# Patient Record
Sex: Female | Born: 1960 | Race: White | Hispanic: No | Marital: Married | Smoking: Never smoker
Health system: Southern US, Community
[De-identification: ages and names within clinical notes are randomized; demographics above are authoritative.]

## PROBLEM LIST (undated history)

## (undated) DIAGNOSIS — E785 Hyperlipidemia, unspecified: Secondary | ICD-10-CM

## (undated) DIAGNOSIS — Z923 Personal history of irradiation: Secondary | ICD-10-CM

## (undated) DIAGNOSIS — F419 Anxiety disorder, unspecified: Secondary | ICD-10-CM

## (undated) DIAGNOSIS — Z800 Family history of malignant neoplasm of digestive organs: Secondary | ICD-10-CM

## (undated) DIAGNOSIS — F988 Other specified behavioral and emotional disorders with onset usually occurring in childhood and adolescence: Secondary | ICD-10-CM

## (undated) DIAGNOSIS — E559 Vitamin D deficiency, unspecified: Secondary | ICD-10-CM

## (undated) DIAGNOSIS — F32A Depression, unspecified: Secondary | ICD-10-CM

## (undated) DIAGNOSIS — F329 Major depressive disorder, single episode, unspecified: Secondary | ICD-10-CM

## (undated) DIAGNOSIS — Z803 Family history of malignant neoplasm of breast: Secondary | ICD-10-CM

## (undated) DIAGNOSIS — C50912 Malignant neoplasm of unspecified site of left female breast: Secondary | ICD-10-CM

## (undated) DIAGNOSIS — L409 Psoriasis, unspecified: Secondary | ICD-10-CM

## (undated) HISTORY — DX: Anxiety disorder, unspecified: F41.9

## (undated) HISTORY — DX: Vitamin D deficiency, unspecified: E55.9

## (undated) HISTORY — DX: Psoriasis, unspecified: L40.9

## (undated) HISTORY — PX: COLONOSCOPY: SHX174

## (undated) HISTORY — PX: BUNIONECTOMY: SHX129

## (undated) HISTORY — DX: Depression, unspecified: F32.A

## (undated) HISTORY — DX: Family history of malignant neoplasm of breast: Z80.3

## (undated) HISTORY — DX: Hyperlipidemia, unspecified: E78.5

## (undated) HISTORY — DX: Family history of malignant neoplasm of digestive organs: Z80.0

## (undated) HISTORY — DX: Other specified behavioral and emotional disorders with onset usually occurring in childhood and adolescence: F98.8

## (undated) HISTORY — DX: Malignant neoplasm of unspecified site of left female breast: C50.912

## (undated) HISTORY — DX: Major depressive disorder, single episode, unspecified: F32.9

---

## 2004-09-27 ENCOUNTER — Ambulatory Visit: Payer: Self-pay | Admitting: Podiatry

## 2006-12-09 ENCOUNTER — Emergency Department: Payer: Self-pay

## 2006-12-09 ENCOUNTER — Other Ambulatory Visit: Payer: Self-pay

## 2008-12-01 HISTORY — PX: BUNIONECTOMY: SHX129

## 2010-06-13 ENCOUNTER — Ambulatory Visit: Payer: Self-pay | Admitting: Family Medicine

## 2011-07-21 ENCOUNTER — Ambulatory Visit: Payer: Self-pay | Admitting: Family Medicine

## 2013-05-19 ENCOUNTER — Ambulatory Visit: Payer: Self-pay | Admitting: Family Medicine

## 2014-07-04 ENCOUNTER — Ambulatory Visit: Payer: Self-pay | Admitting: Family Medicine

## 2014-07-04 LAB — HM MAMMOGRAPHY: HM MAMMO: NEGATIVE

## 2014-11-16 ENCOUNTER — Ambulatory Visit: Payer: Self-pay | Admitting: Podiatry

## 2015-01-08 ENCOUNTER — Ambulatory Visit: Payer: Self-pay | Admitting: Family Medicine

## 2015-01-08 LAB — CBC AND DIFFERENTIAL
HCT: 39 % (ref 36–46)
HEMOGLOBIN: 13.5 g/dL (ref 12.0–16.0)
PLATELETS: 422 10*3/uL — AB (ref 150–399)

## 2015-11-08 ENCOUNTER — Encounter: Payer: Self-pay | Admitting: Physician Assistant

## 2016-01-18 ENCOUNTER — Ambulatory Visit (INDEPENDENT_AMBULATORY_CARE_PROVIDER_SITE_OTHER): Payer: BC Managed Care – PPO | Admitting: Physician Assistant

## 2016-01-18 ENCOUNTER — Telehealth: Payer: Self-pay | Admitting: *Deleted

## 2016-01-18 ENCOUNTER — Encounter: Payer: Self-pay | Admitting: Physician Assistant

## 2016-01-18 VITALS — BP 140/90 | HR 84 | Temp 98.4°F | Resp 16 | Wt 124.4 lb

## 2016-01-18 DIAGNOSIS — K625 Hemorrhage of anus and rectum: Secondary | ICD-10-CM

## 2016-01-18 DIAGNOSIS — IMO0002 Reserved for concepts with insufficient information to code with codable children: Secondary | ICD-10-CM | POA: Insufficient documentation

## 2016-01-18 DIAGNOSIS — F329 Major depressive disorder, single episode, unspecified: Secondary | ICD-10-CM | POA: Insufficient documentation

## 2016-01-18 DIAGNOSIS — E785 Hyperlipidemia, unspecified: Secondary | ICD-10-CM | POA: Insufficient documentation

## 2016-01-18 DIAGNOSIS — F419 Anxiety disorder, unspecified: Secondary | ICD-10-CM | POA: Insufficient documentation

## 2016-01-18 DIAGNOSIS — Z800 Family history of malignant neoplasm of digestive organs: Secondary | ICD-10-CM

## 2016-01-18 DIAGNOSIS — E559 Vitamin D deficiency, unspecified: Secondary | ICD-10-CM | POA: Insufficient documentation

## 2016-01-18 DIAGNOSIS — F32A Depression, unspecified: Secondary | ICD-10-CM | POA: Insufficient documentation

## 2016-01-18 DIAGNOSIS — R739 Hyperglycemia, unspecified: Secondary | ICD-10-CM | POA: Insufficient documentation

## 2016-01-18 DIAGNOSIS — E7800 Pure hypercholesterolemia, unspecified: Secondary | ICD-10-CM | POA: Insufficient documentation

## 2016-01-18 DIAGNOSIS — L409 Psoriasis, unspecified: Secondary | ICD-10-CM | POA: Insufficient documentation

## 2016-01-18 DIAGNOSIS — F988 Other specified behavioral and emotional disorders with onset usually occurring in childhood and adolescence: Secondary | ICD-10-CM | POA: Insufficient documentation

## 2016-01-18 LAB — POC HEMOCCULT BLD/STL (OFFICE/1-CARD/DIAGNOSTIC): Fecal Occult Blood, POC: POSITIVE — AB

## 2016-01-18 MED ORDER — HYDROCORTISONE ACETATE 25 MG RE SUPP: 25.0000 mg | Freq: Two times a day (BID) | RECTAL | Status: DC

## 2016-01-18 NOTE — Progress Notes (Signed)
Patient: Tricia Rivera Female    DOB: 17-Jul-1961   55 y.o.   MRN: [id number] Visit Date: 01/18/2016  Today's Provider: Mar Daring, PA-C   Chief Complaint  Patient presents with  . Rectal Bleeding   Subjective:    Rectal Bleeding  The current episode started yesterday (heavy bleeding). The onset was gradual (for a week she started noticing in the toilet paper and then she didn't see any for 4 days and out of no where yesterday she notice the blood in the toilet and frequently since then even without a BM. She has been feeling more tired since yesterday night.). The problem occurs frequently. The problem has been gradually worsening. The patient is experiencing no pain. The stool is described as soft. There was no prior successful therapy. Pertinent negatives include no anorexia, no fever, no abdominal pain, no diarrhea, no hematemesis, no hemorrhoids, no nausea, no rectal pain, no vomiting, no vaginal bleeding, no vaginal discharge, no chest pain, no headaches, no coughing, no difficulty breathing and no rash. She has been eating and drinking normally. Her past medical history does not include a recent illness.      No Known Allergies Previous Medications   CLOBETASOL OINTMENT (TEMOVATE) 0.05 %    APPLY TO PSORIASIS AREAS TWICE DAILY UNTIL CLEAR. CAN APPLY UNDER OCCLUSION WITH SARAN WRAP NIGHTLY   TRIAMCINOLONE CREAM (KENALOG) 0.1 %    APPLY TWICE A DAY TO AFFECTED AREAS    Review of Systems  Constitutional: Positive for chills and fatigue (yesterday was only able to walk a  mile and usually walks 2 miles.). Negative for fever.  Respiratory: Negative for cough.   Cardiovascular: Negative for chest pain.  Gastrointestinal: Positive for blood in stool (hasnt seen mixed in stool, just seen BRBPR in toilet), hematochezia and anal bleeding. Negative for nausea, vomiting, abdominal pain, diarrhea, constipation, rectal pain, anorexia, hematemesis and hemorrhoids.    Genitourinary: Negative for vaginal bleeding and vaginal discharge.  Skin: Negative for rash.  Neurological: Negative for headaches.    Social History  Substance Use Topics  . Smoking status: Never Smoker   . Smokeless tobacco: Not on file  . Alcohol Use: 1.8 oz/week    3 Glasses of wine per week   Objective:   BP 140/90 mmHg  Pulse 84  Temp(Src) 98.4 F (36.9 C) (Oral)  Resp 16  Wt 124 lb 6.4 oz (56.427 kg)  Physical Exam  Constitutional: She appears well-developed and well-nourished. No distress.  Cardiovascular: Normal rate, regular rhythm and normal heart sounds.  Exam reveals no gallop and no friction rub.   No murmur heard. Pulmonary/Chest: Effort normal and breath sounds normal. No respiratory distress. She has no wheezes. She has no rales.  Genitourinary: Rectal exam shows external hemorrhoid (soft, nontender, non-bleeding), internal hemorrhoid (tender and palpable internal hemorrhoid on exam) and tenderness. Rectal exam shows no fissure, no mass and anal tone normal. Guaiac positive stool.  Skin: She is not diaphoretic.  Vitals reviewed.       Assessment & Plan:     1. Rectal bleeding Positive hemoccult card today in the office and visual appearance of BRBPR. No blood thinners. No anti-inflammatories. BP and pulse are stable. Possibly from internal hemorrhoid. Will check labs as below and f/u pending lab results.  Also will refer to GI for further evaluation and treatment.  Anusol suppository given to patient for possible internal hemorrhoid treatment. Advised to also soak in warm water with epsom  salt for symptomatic relief. Advised of warning signs of becoming hemodynamically unstable and/or worsening signs such as not being able to have a BM or pass gas.  If these are to occur she is to go to the hospital. I will see her back in 2 weeks or sooner if needed for recheck.  - CBC w/Diff/Platelet - [facility] - Ambulatory referral to Gastroenterology - POC Hemoccult Bld/Stl  (1-Cd Office Dx) - hydrocortisone (ANUSOL-HC) 25 MG suppository; Place 1 suppository (25 mg total) rectally 2 (two) times daily.  Dispense: 12 suppository; Refill: 0  2. Family history of colon cancer See above medical treatment plan. - [facility] - Ambulatory referral to Gastroenterology       Mar Daring, PA-C  [facility]

## 2016-01-18 NOTE — Patient Instructions (Signed)

## 2016-01-18 NOTE — Telephone Encounter (Signed)
Patient called of to schedule an appointment. Patient stated that she was been seeing blood in the toilet after she uses the bathroom for the past 2 weeks. Patient stated that blood amount was a lot yesterday. Patient has symptoms of fatigue, but no other symptoms . Patient stated that she is not sure if blood is coming from her vagina or her rectum. Patient has appt scheduled for 11 am this morning.

## 2016-01-19 LAB — CBC WITH DIFFERENTIAL/PLATELET
BASOS: 1 %
Basophils Absolute: 0 10*3/uL (ref 0.0–0.2)
EOS (ABSOLUTE): 0.1 10*3/uL (ref 0.0–0.4)
EOS: 1 %
HEMATOCRIT: 42.7 % (ref 34.0–46.6)
HEMOGLOBIN: 14.4 g/dL (ref 11.1–15.9)
IMMATURE GRANS (ABS): 0 10*3/uL (ref 0.0–0.1)
IMMATURE GRANULOCYTES: 0 %
LYMPHS: 27 %
Lymphocytes Absolute: 1.3 10*3/uL (ref 0.7–3.1)
MCH: 32.9 pg (ref 26.6–33.0)
MCHC: 33.7 g/dL (ref 31.5–35.7)
MCV: 98 fL — AB (ref 79–97)
MONOCYTES: 10 %
Monocytes Absolute: 0.5 10*3/uL (ref 0.1–0.9)
NEUTROS ABS: 3.1 10*3/uL (ref 1.4–7.0)
NEUTROS PCT: 61 %
PLATELETS: 334 10*3/uL (ref 150–379)
RBC: 4.38 x10E6/uL (ref 3.77–5.28)
RDW: 13.6 % (ref 12.3–15.4)
WBC: 5 10*3/uL (ref 3.4–10.8)

## 2016-01-19 LAB — CEA: CEA: 3 ng/mL (ref 0.0–4.7)

## 2016-01-21 ENCOUNTER — Telehealth: Payer: Self-pay

## 2016-01-21 NOTE — Telephone Encounter (Signed)
Patient advised as directed below. Per patient the bleeding has stop.  Thanks,  -Bronwen Pendergraft

## 2016-01-21 NOTE — Telephone Encounter (Signed)
Wonderful. 

## 2016-01-21 NOTE — Telephone Encounter (Signed)
LMTCB  Thanks,  -Odesser Tourangeau 

## 2016-01-21 NOTE — Telephone Encounter (Signed)
-----   Message from Mar Daring, [place] sent at 01/21/2016  8:04 AM EST ----- All labs are within normal limits and stable.  Thanks! -JB

## 2016-02-01 ENCOUNTER — Ambulatory Visit: Payer: BC Managed Care – PPO | Admitting: Physician Assistant

## 2016-02-13 ENCOUNTER — Ambulatory Visit (INDEPENDENT_AMBULATORY_CARE_PROVIDER_SITE_OTHER): Payer: BC Managed Care – PPO | Admitting: Physician Assistant

## 2016-02-13 ENCOUNTER — Encounter: Payer: Self-pay | Admitting: Physician Assistant

## 2016-02-13 ENCOUNTER — Other Ambulatory Visit: Payer: Self-pay | Admitting: Physician Assistant

## 2016-02-13 ENCOUNTER — Ambulatory Visit: Payer: BC Managed Care – PPO | Admitting: Physician Assistant

## 2016-02-13 VITALS — BP 135/80 | HR 80 | Temp 98.3°F | Resp 16 | Ht 65.0 in | Wt 125.8 lb

## 2016-02-13 DIAGNOSIS — Z124 Encounter for screening for malignant neoplasm of cervix: Secondary | ICD-10-CM | POA: Diagnosis not present

## 2016-02-13 DIAGNOSIS — E7800 Pure hypercholesterolemia, unspecified: Secondary | ICD-10-CM

## 2016-02-13 DIAGNOSIS — E559 Vitamin D deficiency, unspecified: Secondary | ICD-10-CM | POA: Diagnosis not present

## 2016-02-13 DIAGNOSIS — Z1239 Encounter for other screening for malignant neoplasm of breast: Secondary | ICD-10-CM

## 2016-02-13 DIAGNOSIS — Z1159 Encounter for screening for other viral diseases: Secondary | ICD-10-CM | POA: Diagnosis not present

## 2016-02-13 DIAGNOSIS — R7309 Other abnormal glucose: Secondary | ICD-10-CM | POA: Diagnosis not present

## 2016-02-13 DIAGNOSIS — Z1231 Encounter for screening mammogram for malignant neoplasm of breast: Secondary | ICD-10-CM

## 2016-02-13 DIAGNOSIS — Z0000 Encounter for general adult medical examination without abnormal findings: Secondary | ICD-10-CM | POA: Diagnosis not present

## 2016-02-13 DIAGNOSIS — R739 Hyperglycemia, unspecified: Secondary | ICD-10-CM

## 2016-02-13 NOTE — Progress Notes (Signed)
Patient: Tricia Rivera, Female    DOB: 1961-07-22, 55 y.o.   MRN: [id number] Visit Date: 02/13/2016  Today's Provider: Mar Daring, PA-C   Chief Complaint  Patient presents with  . Annual Exam   Subjective:    Annual physical exam Tricia Rivera is a 55 y.o. female who presents today for health maintenance and complete physical. She feels well. She reports exercising-walks daily 2 miles with her dog.. She reports she is sleeping well. She had her Colonoscopy 01/31/2016 for rectal bleeding done by Dr. Rayann Heman. She had one polyp removed which was tubular adenoma and had many internal hemorrhoids.   Mammogram: 07/04/14 BI-RADS Category 1: Negative Pap: She cannot remember her last pap and we cannot find one on file.  Colonoscopy: 01/31/2016- one polyp; tubular adenoma, internal hemorrhoids. Repeat in 10 years. -----------------------------------------------------------------   Review of Systems  Constitutional: Negative.   HENT: Negative.   Eyes: Negative.   Respiratory: Negative.   Cardiovascular: Negative.   Gastrointestinal: Negative.   Endocrine: Negative.   Genitourinary: Negative.   Musculoskeletal: Negative.   Skin: Negative.        Psoriasis  Allergic/Immunologic: Negative.   Neurological: Negative.   Hematological: Negative.   Psychiatric/Behavioral: Negative.     Social History      She  reports that she has never smoked. She does not have any smokeless tobacco history on file. She reports that she drinks about 1.8 oz of alcohol per week. She reports that she does not use illicit drugs.       Social History   Social History  . Marital Status: Married    Spouse Name: N/A  . Number of Children: N/A  . Years of Education: N/A   Social History Main Topics  . Smoking status: Never Smoker   . Smokeless tobacco: None  . Alcohol Use: 1.8 oz/week    3 Glasses of wine per week  . Drug Use: No  . Sexual Activity: Not Asked   Other Topics  Concern  . None   Social History Narrative    Past Medical History  Diagnosis Date  . Anxiety   . Depression   . Hyperlipidemia   . ADD (attention deficit disorder)   . Psoriasis   . Vitamin D deficiency      Patient Active Problem List   Diagnosis Date Noted  . ASCUS favoring benign 01/18/2016  . ADD (attention deficit disorder) 01/18/2016  . Anxiety 01/18/2016  . Clinical depression 01/18/2016  . Elevated blood sugar 01/18/2016  . Hypercholesteremia 01/18/2016  . Psoriasis 01/18/2016  . Avitaminosis D 01/18/2016    Past Surgical History  Procedure Laterality Date  . Bunionectomy  2010    Family History        Family Status  Relation Status Death Age  . Maternal Grandfather Deceased     died of liver cancer.  . Mother Deceased 17-Jan-2015    died from pancreatic cancer  . Father Deceased [place]  . Sister Alive         Her family history includes Cancer in her maternal grandfather and paternal grandfather.    No Known Allergies  Previous Medications   CLOBETASOL OINTMENT (TEMOVATE) 0.05 %    Reported on 02/13/2016   HYDROCORTISONE (ANUSOL-HC) 25 MG SUPPOSITORY    Place 1 suppository (25 mg total) rectally 2 (two) times daily.   TRIAMCINOLONE CREAM (KENALOG) 0.1 %    APPLY TWICE A  DAY TO AFFECTED AREAS    Patient Care Team: Mar Daring, PA-C as PCP - General (Family Medicine)     Objective:   Vitals: BP 135/80 mmHg  Pulse 80  Temp(Src) 98.3 F (36.8 C) (Oral)  Resp 16  Ht 5\' 5"  (1.651 m)  Wt 125 lb 12.8 oz (57.063 kg)  BMI 20.93 kg/m2   Physical Exam  Constitutional: She is oriented to person, place, and time. She appears well-developed and well-nourished. No distress.  HENT:  Head: Normocephalic and atraumatic.  Right Ear: Hearing, tympanic membrane, external ear and ear canal normal.  Left Ear: Hearing, tympanic membrane, external ear and ear canal normal.  Nose: Nose normal.  Mouth/Throat: Uvula is midline, oropharynx is  clear and moist and mucous membranes are normal. No oropharyngeal exudate.  Eyes: Conjunctivae and EOM are normal. Pupils are equal, round, and reactive to light. Right eye exhibits no discharge. Left eye exhibits no discharge. No scleral icterus.  Neck: Normal range of motion. Neck supple. No JVD present. Carotid bruit is not present. No tracheal deviation present. No thyromegaly present.  Cardiovascular: Normal rate, regular rhythm, normal heart sounds and intact distal pulses.  Exam reveals no gallop and no friction rub.   No murmur heard. Pulmonary/Chest: Effort normal and breath sounds normal. No respiratory distress. She has no wheezes. She has no rales. She exhibits no tenderness. Right breast exhibits no inverted nipple, no mass, no nipple discharge, no skin change and no tenderness. Left breast exhibits no inverted nipple, no mass, no nipple discharge, no skin change and no tenderness. Breasts are symmetrical.  Abdominal: Soft. Bowel sounds are normal. She exhibits no distension and no mass. There is no tenderness. There is no rebound and no guarding. Hernia confirmed negative in the right inguinal area and confirmed negative in the left inguinal area.  Genitourinary: Rectum normal, vagina normal and uterus normal. No breast swelling, tenderness, discharge or bleeding. Pelvic exam was performed with patient supine. There is no rash, tenderness, lesion or injury on the right labia. There is no rash, tenderness, lesion or injury on the left labia. Cervix exhibits no motion tenderness, no discharge and no friability. Right adnexum displays no mass, no tenderness and no fullness. Left adnexum displays no mass, no tenderness and no fullness. No erythema, tenderness or bleeding in the vagina. No signs of injury around the vagina. No vaginal discharge found.  Musculoskeletal: Normal range of motion. She exhibits no edema or tenderness.  Lymphadenopathy:    She has no cervical adenopathy.       Right: No  inguinal adenopathy present.       Left: No inguinal adenopathy present.  Neurological: She is alert and oriented to person, place, and time. She has normal reflexes. No cranial nerve deficit. Coordination normal.  Skin: Skin is warm and dry. No rash noted. She is not diaphoretic.  Psychiatric: She has a normal mood and affect. Her behavior is normal. Judgment and thought content normal.  Vitals reviewed.    Depression Screen No flowsheet data found.    Assessment & Plan:     Routine Health Maintenance and Physical Exam  1. Annual physical exam Physical exam today was normal. I will check labs as below and follow-up with her pending these lab results. If labs are within normal limits and stable she would not need to have them rechecked for 1 year at her repeat annual physical exam. She is to call the office if she has any acute issues, questions  or concerns in the meantime. - CBC with Differential - Comprehensive metabolic panel - TSH  2. Cervical cancer screening Pap was collected today. I will follow-up with her pending these results. - Pap IG w/ reflex to HPV when ASC-U ([facility])  3. Hypercholesteremia She does have history of high cholesterol but is not currently on any cholesterol-lowering medication. I will check cluster of panel as below and follow-up pending these results. - Lipid panel  4. Elevated blood sugar She does have history of elevated blood sugar. I will check hemoglobin A1c as below and follow-up pending results. - Hemoglobin A1c  5. Avitaminosis D History of vitamin D deficiency. She is not currently on any vitamin D supplement. I will check labs as below and follow-up pending results.  6. Breast cancer screening Breast exam today was normal. She does perform self breast exams regularly. There is a family history of breast cancer in a maternal aunt. Mammogram was ordered as below. Information for [facility] breast clinic was given to patient so  that she may call to schedule her mammogram at her convenience. - Mammogram Digital screening Bilateral; Future  7. Need for hepatitis C screening test Will screen for hepatitis C and follow-up pending results. - Hepatitis C antibody screen   Exercise Activities and Dietary recommendations Goals    None      Immunization History  Administered Date(s) Administered  . Tdap 08/10/2007    Health Maintenance  Topic Date Due  . Hepatitis C Screening  September 08, 1961  . HIV Screening  02/10/1974  . PAP SMEAR  02/11/1980  . COLONOSCOPY  02/10/2009  . INFLUENZA VACCINE  07/02/2015  . MAMMOGRAM  07/04/2016  . TETANUS/TDAP  08/09/2017      Discussed health benefits of physical activity, and encouraged her to engage in regular exercise appropriate for her age and condition.    --------------------------------------------------------------------

## 2016-02-13 NOTE — Patient Instructions (Signed)
Health Maintenance, Female Adopting a healthy lifestyle and getting preventive care can go a long way to promote health and wellness. Talk with your health care provider about what schedule of regular examinations is right for you. This is a good chance for you to check in with your provider about disease prevention and staying healthy. In between checkups, there are plenty of things you can do on your own. Experts have done a lot of research about which lifestyle changes and preventive measures are most likely to keep you healthy. Ask your health care provider for more information. WEIGHT AND DIET  Eat a healthy diet  Be sure to include plenty of vegetables, fruits, low-fat dairy products, and lean protein.  Do not eat a lot of foods high in solid fats, added sugars, or salt.  Get regular exercise. This is one of the most important things you can do for your health.  Most adults should exercise for at least 150 minutes each week. The exercise should increase your heart rate and make you sweat (moderate-intensity exercise).  Most adults should also do strengthening exercises at least twice a week. This is in addition to the moderate-intensity exercise.  Maintain a healthy weight  Body mass index (BMI) is a measurement that can be used to identify possible weight problems. It estimates body fat based on height and weight. Your health care provider can help determine your BMI and help you achieve or maintain a healthy weight.  For females 28 years of age and older:   A BMI below 18.5 is considered underweight.  A BMI of 18.5 to 24.9 is normal.  A BMI of 25 to 29.9 is considered overweight.  A BMI of 30 and above is considered obese.  Watch levels of cholesterol and blood lipids  You should start having your blood tested for lipids and cholesterol at 57 years of age, then have this test every 5 years.  You may need to have your cholesterol levels checked more often if:  Your lipid  or cholesterol levels are high.  You are older than 57 years of age.  You are at high risk for heart disease.  CANCER SCREENING   Lung Cancer  Lung cancer screening is recommended for adults 75-66 years old who are at high risk for lung cancer because of a history of smoking.  A yearly low-dose CT scan of the lungs is recommended for people who:  Currently smoke.  Have quit within the past 15 years.  Have at least a 30-pack-year history of smoking. A pack year is smoking an average of one pack of cigarettes a day for 1 year.  Yearly screening should continue until it has been 15 years since you quit.  Yearly screening should stop if you develop a health problem that would prevent you from having lung cancer treatment.  Breast Cancer  Practice breast self-awareness. This means understanding how your breasts normally appear and feel.  It also means doing regular breast self-exams. Let your health care provider know about any changes, no matter how small.  If you are in your 20s or 30s, you should have a clinical breast exam (CBE) by a health care provider every 1-3 years as part of a regular health exam.  If you are 25 or older, have a CBE every year. Also consider having a breast X-ray (mammogram) every year.  If you have a family history of breast cancer, talk to your health care provider about genetic screening.  If you  are at high risk for breast cancer, talk to your health care provider about having an MRI and a mammogram every year.  Breast cancer gene (BRCA) assessment is recommended for women who have family members with BRCA-related cancers. BRCA-related cancers include:  Breast.  Ovarian.  Tubal.  Peritoneal cancers.  Results of the assessment will determine the need for genetic counseling and BRCA1 and BRCA2 testing. Cervical Cancer Your health care provider may recommend that you be screened regularly for cancer of the pelvic organs (ovaries, uterus, and  vagina). This screening involves a pelvic examination, including checking for microscopic changes to the surface of your cervix (Pap test). You may be encouraged to have this screening done every 3 years, beginning at age 21.  For women ages 30-65, health care providers may recommend pelvic exams and Pap testing every 3 years, or they may recommend the Pap and pelvic exam, combined with testing for human papilloma virus (HPV), every 5 years. Some types of HPV increase your risk of cervical cancer. Testing for HPV may also be done on women of any age with unclear Pap test results.  Other health care providers may not recommend any screening for nonpregnant women who are considered low risk for pelvic cancer and who do not have symptoms. Ask your health care provider if a screening pelvic exam is right for you.  If you have had past treatment for cervical cancer or a condition that could lead to cancer, you need Pap tests and screening for cancer for at least 20 years after your treatment. If Pap tests have been discontinued, your risk factors (such as having a new sexual partner) need to be reassessed to determine if screening should resume. Some women have medical problems that increase the chance of getting cervical cancer. In these cases, your health care provider may recommend more frequent screening and Pap tests. Colorectal Cancer  This type of cancer can be detected and often prevented.  Routine colorectal cancer screening usually begins at 57 years of age and continues through 57 years of age.  Your health care provider may recommend screening at an earlier age if you have risk factors for colon cancer.  Your health care provider may also recommend using home test kits to check for hidden blood in the stool.  A small camera at the end of a tube can be used to examine your colon directly (sigmoidoscopy or colonoscopy). This is done to check for the earliest forms of colorectal  cancer.  Routine screening usually begins at age 50.  Direct examination of the colon should be repeated every 5-10 years through 57 years of age. However, you may need to be screened more often if early forms of precancerous polyps or small growths are found. Skin Cancer  Check your skin from head to toe regularly.  Tell your health care provider about any new moles or changes in moles, especially if there is a change in a mole's shape or color.  Also tell your health care provider if you have a mole that is larger than the size of a pencil eraser.  Always use sunscreen. Apply sunscreen liberally and repeatedly throughout the day.  Protect yourself by wearing long sleeves, pants, a wide-brimmed hat, and sunglasses whenever you are outside. HEART DISEASE, DIABETES, AND HIGH BLOOD PRESSURE   High blood pressure causes heart disease and increases the risk of stroke. High blood pressure is more likely to develop in:  People who have blood pressure in the high end   of the normal range (130-139/85-89 mm Hg).  People who are overweight or obese.  People who are African American.  If you are 38-23 years of age, have your blood pressure checked every 3-5 years. If you are 61 years of age or older, have your blood pressure checked every year. You should have your blood pressure measured twice--once when you are at a hospital or clinic, and once when you are not at a hospital or clinic. Record the average of the two measurements. To check your blood pressure when you are not at a hospital or clinic, you can use:  An automated blood pressure machine at a pharmacy.  A home blood pressure monitor.  If you are between 45 years and 39 years old, ask your health care provider if you should take aspirin to prevent strokes.  Have regular diabetes screenings. This involves taking a blood sample to check your fasting blood sugar level.  If you are at a normal weight and have a low risk for diabetes,  have this test once every three years after 57 years of age.  If you are overweight and have a high risk for diabetes, consider being tested at a younger age or more often. PREVENTING INFECTION  Hepatitis B  If you have a higher risk for hepatitis B, you should be screened for this virus. You are considered at high risk for hepatitis B if:  You were born in a country where hepatitis B is common. Ask your health care provider which countries are considered high risk.  Your parents were born in a high-risk country, and you have not been immunized against hepatitis B (hepatitis B vaccine).  You have HIV or AIDS.  You use needles to inject street drugs.  You live with someone who has hepatitis B.  You have had sex with someone who has hepatitis B.  You get hemodialysis treatment.  You take certain medicines for conditions, including cancer, organ transplantation, and autoimmune conditions. Hepatitis C  Blood testing is recommended for:  Everyone born from [birth year] through 1967.  Anyone with known risk factors for hepatitis C. Sexually transmitted infections (STIs)  You should be screened for sexually transmitted infections (STIs) including gonorrhea and chlamydia if:  You are sexually active and are younger than 57 years of age.  You are older than 57 years of age and your health care provider tells you that you are at risk for this type of infection.  Your sexual activity has changed since you were last screened and you are at an increased risk for chlamydia or gonorrhea. Ask your health care provider if you are at risk.  If you do not have HIV, but are at risk, it may be recommended that you take a prescription medicine daily to prevent HIV infection. This is called pre-exposure prophylaxis (PrEP). You are considered at risk if:  You are sexually active and do not regularly use condoms or know the HIV status of your partner(s).  You take drugs by injection.  You are sexually  active with a partner who has HIV. Talk with your health care provider about whether you are at high risk of being infected with HIV. If you choose to begin PrEP, you should first be tested for HIV. You should then be tested every 3 months for as long as you are taking PrEP.  PREGNANCY   If you are premenopausal and you may become pregnant, ask your health care provider about preconception counseling.  If you may  become pregnant, take 400 to 800 micrograms (mcg) of folic acid every day.  If you want to prevent pregnancy, talk to your health care provider about birth control (contraception). OSTEOPOROSIS AND MENOPAUSE   Osteoporosis is a disease in which the bones lose minerals and strength with aging. This can result in serious bone fractures. Your risk for osteoporosis can be identified using a bone density scan.  If you are 61 years of age or older, or if you are at risk for osteoporosis and fractures, ask your health care provider if you should be screened.  Ask your health care provider whether you should take a calcium or vitamin D supplement to lower your risk for osteoporosis.  Menopause may have certain physical symptoms and risks.  Hormone replacement therapy may reduce some of these symptoms and risks. Talk to your health care provider about whether hormone replacement therapy is right for you.  HOME CARE INSTRUCTIONS   Schedule regular health, dental, and eye exams.  Stay current with your immunizations.   Do not use any tobacco products including cigarettes, chewing tobacco, or electronic cigarettes.  If you are pregnant, do not drink alcohol.  If you are breastfeeding, limit how much and how often you drink alcohol.  Limit alcohol intake to no more than 1 drink per day for nonpregnant women. One drink equals 12 ounces of beer, 5 ounces of wine, or 1 ounces of hard liquor.  Do not use street drugs.  Do not share needles.  Ask your health care provider for help if  you need support or information about quitting drugs.  Tell your health care provider if you often feel depressed.  Tell your health care provider if you have ever been abused or do not feel safe at home.   This information is not intended to replace advice given to you by your health care provider. Make sure you discuss any questions you have with your health care provider.   Document Released: 06/02/2011 Document Revised: 12/08/2014 Document Reviewed: 10/19/2013 Elsevier Interactive Patient Education [employer].

## 2016-02-14 ENCOUNTER — Telehealth: Payer: Self-pay | Admitting: Physician Assistant

## 2016-02-14 NOTE — Telephone Encounter (Signed)
Tricia Rivera, can you call the patient and see what she needs? Thanks!

## 2016-02-14 NOTE — Telephone Encounter (Signed)
Yes that should be fine.  Can someone work on this for her? Thank you!

## 2016-02-14 NOTE — Telephone Encounter (Signed)
Pt stated that she is needing to get a letter providing from 2011-2016 when she had cholesterol checked & test/screenings (pap, bone density, etc). Please advise. Thanks TNP

## 2016-02-15 ENCOUNTER — Ambulatory Visit
Admission: RE | Admit: 2016-02-15 | Discharge: 2016-02-15 | Disposition: A | Discharge status: Home / Self Care | Payer: BC Managed Care – PPO | Source: Ambulatory Visit | Attending: Physician Assistant | Admitting: Physician Assistant

## 2016-02-15 DIAGNOSIS — Z1231 Encounter for screening mammogram for malignant neoplasm of breast: Secondary | ICD-10-CM

## 2016-02-15 LAB — CBC WITH DIFFERENTIAL/PLATELET
BASOS ABS: 0 10*3/uL (ref 0.0–0.2)
Basos: 1 %
EOS (ABSOLUTE): 0.1 10*3/uL (ref 0.0–0.4)
Eos: 2 %
HEMATOCRIT: 43.3 % (ref 34.0–46.6)
HEMOGLOBIN: 14.6 g/dL (ref 11.1–15.9)
Immature Grans (Abs): 0 10*3/uL (ref 0.0–0.1)
Immature Granulocytes: 0 %
LYMPHS ABS: 1.9 10*3/uL (ref 0.7–3.1)
Lymphs: 44 %
MCH: 32.6 pg (ref 26.6–33.0)
MCHC: 33.7 g/dL (ref 31.5–35.7)
MCV: 97 fL (ref 79–97)
MONOCYTES: 12 %
MONOS ABS: 0.5 10*3/uL (ref 0.1–0.9)
NEUTROS ABS: 1.8 10*3/uL (ref 1.4–7.0)
Neutrophils: 41 %
Platelets: 352 10*3/uL (ref 150–379)
RBC: 4.48 x10E6/uL (ref 3.77–5.28)
RDW: 13.7 % (ref 12.3–15.4)
WBC: 4.4 10*3/uL (ref 3.4–10.8)

## 2016-02-15 LAB — COMPREHENSIVE METABOLIC PANEL
A/G RATIO: 1.8 (ref 1.2–2.2)
ALK PHOS: 75 IU/L (ref 39–117)
ALT: 28 IU/L (ref 0–32)
AST: 26 IU/L (ref 0–40)
Albumin: 4.5 g/dL (ref 3.5–5.5)
BILIRUBIN TOTAL: 0.5 mg/dL (ref 0.0–1.2)
BUN/Creatinine Ratio: 14 (ref 9–23)
BUN: 11 mg/dL (ref 6–24)
CHLORIDE: 99 mmol/L (ref 96–106)
CO2: 27 mmol/L (ref 18–29)
Calcium: 9.8 mg/dL (ref 8.7–10.2)
Creatinine, Ser: 0.79 mg/dL (ref 0.57–1.00)
GFR calc Af Amer: 96 mL/min/{1.73_m2} (ref >59)
GFR, EST NON AFRICAN AMERICAN: 83 mL/min/{1.73_m2} (ref >59)
GLOBULIN, TOTAL: 2.5 g/dL (ref 1.5–4.5)
Glucose: 100 mg/dL — ABNORMAL HIGH (ref 65–99)
POTASSIUM: 4.6 mmol/L (ref 3.5–5.2)
SODIUM: 141 mmol/L (ref 134–144)
Total Protein: 7 g/dL (ref 6.0–8.5)

## 2016-02-15 LAB — LIPID PANEL
CHOLESTEROL TOTAL: 282 mg/dL — AB (ref 100–199)
Chol/HDL Ratio: 2.8 ratio units (ref 0.0–4.4)
HDL: 99 mg/dL (ref >39)
LDL Calculated: 164 mg/dL — ABNORMAL HIGH (ref 0–99)
Triglycerides: 97 mg/dL (ref 0–149)
VLDL CHOLESTEROL CAL: 19 mg/dL (ref 5–40)

## 2016-02-15 LAB — HEMOGLOBIN A1C
ESTIMATED AVERAGE GLUCOSE: 120 mg/dL
HEMOGLOBIN A1C: 5.8 % — AB (ref 4.8–5.6)

## 2016-02-15 LAB — TSH: TSH: 4.46 u[IU]/mL (ref 0.450–4.500)

## 2016-02-15 LAB — HEPATITIS C ANTIBODY: HEP C VIRUS AB: 0.1 {s_co_ratio} (ref 0.0–0.9)

## 2016-02-15 NOTE — Telephone Encounter (Signed)
Pt is returning call.  CB#[phone]/MW

## 2016-02-15 NOTE — Telephone Encounter (Signed)
Patient advised as below. Patient verbalizes understanding and is in agreement with treatment plan.  

## 2016-02-15 NOTE — Progress Notes (Signed)
LMTCB

## 2016-02-18 LAB — PAP IG W/ RFLX HPV ASCU: PAP Smear Comment: 0

## 2016-02-19 ENCOUNTER — Other Ambulatory Visit: Payer: Self-pay | Admitting: Physician Assistant

## 2016-02-19 ENCOUNTER — Telehealth: Payer: Self-pay

## 2016-02-19 DIAGNOSIS — R928 Other abnormal and inconclusive findings on diagnostic imaging of breast: Secondary | ICD-10-CM

## 2016-02-19 NOTE — Telephone Encounter (Signed)
Advised patient as below.  

## 2016-02-19 NOTE — Telephone Encounter (Signed)
Left message to call back  

## 2016-02-19 NOTE — Telephone Encounter (Signed)
-----   Message from Mar Daring, [place] sent at 02/18/2016  4:49 PM EDT ----- Pap was negative for lesion or malignancy, but reactive cellular changes were noted along with cellular changes associated with atrophy. Recommend repeat pap in 6 months to check for return to normal.

## 2016-02-26 ENCOUNTER — Encounter: Payer: Self-pay | Admitting: Physician Assistant

## 2016-03-13 ENCOUNTER — Encounter: Payer: Self-pay | Admitting: Physician Assistant

## 2017-06-08 ENCOUNTER — Encounter: Payer: Self-pay | Admitting: Physician Assistant

## 2017-06-08 ENCOUNTER — Telehealth: Payer: Self-pay | Admitting: Physician Assistant

## 2017-06-08 ENCOUNTER — Ambulatory Visit (INDEPENDENT_AMBULATORY_CARE_PROVIDER_SITE_OTHER): Payer: BC Managed Care – PPO | Admitting: Physician Assistant

## 2017-06-08 VITALS — BP 140/90 | HR 100 | Temp 98.0°F | Resp 16 | Ht 65.0 in | Wt 125.0 lb

## 2017-06-08 DIAGNOSIS — Z23 Encounter for immunization: Secondary | ICD-10-CM

## 2017-06-08 DIAGNOSIS — R739 Hyperglycemia, unspecified: Secondary | ICD-10-CM

## 2017-06-08 DIAGNOSIS — L409 Psoriasis, unspecified: Secondary | ICD-10-CM

## 2017-06-08 DIAGNOSIS — Z124 Encounter for screening for malignant neoplasm of cervix: Secondary | ICD-10-CM

## 2017-06-08 DIAGNOSIS — R928 Other abnormal and inconclusive findings on diagnostic imaging of breast: Secondary | ICD-10-CM

## 2017-06-08 DIAGNOSIS — R030 Elevated blood-pressure reading, without diagnosis of hypertension: Secondary | ICD-10-CM | POA: Diagnosis not present

## 2017-06-08 DIAGNOSIS — Z114 Encounter for screening for human immunodeficiency virus [HIV]: Secondary | ICD-10-CM

## 2017-06-08 DIAGNOSIS — Z1239 Encounter for other screening for malignant neoplasm of breast: Secondary | ICD-10-CM

## 2017-06-08 DIAGNOSIS — Z0000 Encounter for general adult medical examination without abnormal findings: Secondary | ICD-10-CM | POA: Diagnosis not present

## 2017-06-08 DIAGNOSIS — Z1231 Encounter for screening mammogram for malignant neoplasm of breast: Secondary | ICD-10-CM

## 2017-06-08 DIAGNOSIS — E559 Vitamin D deficiency, unspecified: Secondary | ICD-10-CM | POA: Diagnosis not present

## 2017-06-08 DIAGNOSIS — E7800 Pure hypercholesterolemia, unspecified: Secondary | ICD-10-CM | POA: Diagnosis not present

## 2017-06-08 DIAGNOSIS — K648 Other hemorrhoids: Secondary | ICD-10-CM | POA: Diagnosis not present

## 2017-06-08 MED ORDER — HYDROCORTISONE 2.5 % RE CREA: 1.0000 "application " | TOPICAL_CREAM | Freq: Two times a day (BID) | RECTAL | 0 refills | Status: DC

## 2017-06-08 NOTE — Telephone Encounter (Signed)
Pt states [facility] will not schedule her mammogram until they receive a diagnostic order from [place].  CB#[phone]/MW

## 2017-06-08 NOTE — Telephone Encounter (Signed)
Diagnostic mammogram ordered.  

## 2017-06-08 NOTE — Patient Instructions (Signed)

## 2017-06-08 NOTE — Progress Notes (Signed)
Patient: Tricia Rivera, Female    DOB: 1961/02/14, 56 y.o.   MRN: [id number] Visit Date: 06/08/2017  Today's Provider: Mar Daring, PA-C   Chief Complaint  Patient presents with  . Annual Exam   Subjective:    Annual physical exam Tricia Rivera is a 56 y.o. female who presents today for health maintenance and complete physical. She feels well. Shereports exercising daily. She reports she is sleeping well.  02/13/16 CPE 02/13/16 Pap-neg; reactive cellular changes; was to have repeated in 6 months but patient did not return. 02/15/16 Mammogram-incomplete; was to have additional imaging but this was not done. 01/31/16 Colonoscopy-polyps -----------------------------------------------------------------   Review of Systems  Constitutional: Negative.   HENT: Negative.   Eyes: Negative.   Respiratory: Negative.   Cardiovascular: Negative.   Gastrointestinal: Positive for anal bleeding (hemorrhoids;internal).  Endocrine: Negative.   Genitourinary: Negative.   Musculoskeletal: Negative.   Skin: Positive for rash (psoriasis).  Allergic/Immunologic: Negative.   Neurological: Negative.   Hematological: Negative.   Psychiatric/Behavioral: Negative.     Social History      She  reports that she has never smoked. She has never used smokeless tobacco. She reports that she drinks about 1.8 oz of alcohol per week . She reports that she does not use drugs.       Social History   Social History  . Marital status: Married    Spouse name: N/A  . Number of children: N/A  . Years of education: N/A   Social History Main Topics  . Smoking status: Never Smoker  . Smokeless tobacco: Never Used  . Alcohol use 1.8 oz/week    3 Glasses of wine per week  . Drug use: No  . Sexual activity: Not Asked   Other Topics Concern  . None   Social History Narrative  . None    Past Medical History:  Diagnosis Date  . ADD (attention deficit disorder)   . Anxiety   .  Depression   . Hyperlipidemia   . Psoriasis   . Vitamin D deficiency      Patient Active Problem List   Diagnosis Date Noted  . ASCUS favoring benign 01/18/2016  . ADD (attention deficit disorder) 01/18/2016  . Anxiety 01/18/2016  . Clinical depression 01/18/2016  . Elevated blood sugar 01/18/2016  . Hypercholesteremia 01/18/2016  . Psoriasis 01/18/2016  . Avitaminosis D 01/18/2016    Past Surgical History:  Procedure Laterality Date  . BUNIONECTOMY  2010    Family History        Family Status  Relation Status  . MGF Deceased       died of liver cancer.  . Mother Deceased at age Feb 01, 2015       died from pancreatic cancer  . Father Deceased at age 41       Liver Cancer  . Sister Alive  . PGF (Not Specified)  . Mat Aunt (Not Specified)  . Sister Alive        Her family history includes Breast cancer in her maternal aunt; Cancer in her maternal grandfather and paternal grandfather.     No Known Allergies   Current Outpatient Prescriptions:  .  clobetasol ointment (TEMOVATE) 0.05 %, Reported on 02/13/2016, Disp: , Rfl: 3 .  triamcinolone cream (KENALOG) 0.1 %, APPLY TWICE A DAY TO AFFECTED AREAS, Disp: , Rfl: 6   Patient Care Team: Rubye Beach as PCP - General (Family Medicine)  Objective:   Vitals: BP 140/90 (BP Location: Left Arm, Patient Position: Sitting, Cuff Size: Normal)   Pulse 100   Temp 98 F (36.7 C) (Oral)   Resp 16   Ht 5\' 5"  (1.651 m)   Wt 125 lb (56.7 kg)   SpO2 98%   BMI 20.80 kg/m    Vitals:   06/08/17 1011  BP: 140/90  Pulse: 100  Resp: 16  Temp: 98 F (36.7 C)  TempSrc: Oral  SpO2: 98%  Weight: 125 lb (56.7 kg)  Height: 5\' 5"  (1.651 m)     Physical Exam  Constitutional: She is oriented to person, place, and time. She appears well-developed and well-nourished. No distress.  HENT:  Head: Normocephalic and atraumatic.  Right Ear: Hearing, tympanic membrane, external ear and ear canal normal.  Left Ear:  Hearing, tympanic membrane, external ear and ear canal normal.  Nose: Nose normal.  Mouth/Throat: Uvula is midline, oropharynx is clear and moist and mucous membranes are normal. No oropharyngeal exudate.  Eyes: Conjunctivae and EOM are normal. Pupils are equal, round, and reactive to light. Right eye exhibits no discharge. Left eye exhibits no discharge. No scleral icterus.  Neck: Normal range of motion. Neck supple. No JVD present. Carotid bruit is not present. No tracheal deviation present. No thyromegaly present.  Cardiovascular: Normal rate, regular rhythm, normal heart sounds and intact distal pulses.  Exam reveals no gallop and no friction rub.   No murmur heard. Pulmonary/Chest: Effort normal and breath sounds normal. No respiratory distress. She has no wheezes. She has no rales. She exhibits no tenderness. Right breast exhibits no inverted nipple, no mass, no nipple discharge, no skin change and no tenderness. Left breast exhibits no inverted nipple, no mass, no nipple discharge, no skin change and no tenderness. Breasts are symmetrical.  Abdominal: Soft. Bowel sounds are normal. She exhibits no distension and no mass. There is no tenderness. There is no rebound and no guarding. Hernia confirmed negative in the right inguinal area and confirmed negative in the left inguinal area.  Genitourinary: Rectum normal, vagina normal and uterus normal. No breast swelling, tenderness, discharge or bleeding. Pelvic exam was performed with patient supine. There is no rash, tenderness, lesion or injury on the right labia. There is no rash, tenderness, lesion or injury on the left labia. Cervix exhibits no motion tenderness, no discharge and no friability. Right adnexum displays no mass, no tenderness and no fullness. Left adnexum displays no mass, no tenderness and no fullness. No erythema, tenderness or bleeding in the vagina. No signs of injury around the vagina. No vaginal discharge found.  Musculoskeletal:  Normal range of motion. She exhibits no edema or tenderness.  Lymphadenopathy:    She has no cervical adenopathy.       Right: No inguinal adenopathy present.       Left: No inguinal adenopathy present.  Neurological: She is alert and oriented to person, place, and time. She has normal reflexes. No cranial nerve deficit. Coordination normal.  Skin: Skin is warm and dry. Rash noted. She is not diaphoretic.  Psoriasis noted in annular patches diffuse across body  Psychiatric: She has a normal mood and affect. Her behavior is normal. Judgment and thought content normal.  Vitals reviewed.    Depression Screen PHQ 2/9 Scores 06/08/2017  PHQ - 2 Score 0  PHQ- 9 Score 0      Assessment & Plan:     Routine Health Maintenance and Physical Exam  Exercise Activities and Dietary  recommendations Goals    None      Immunization History  Administered Date(s) Administered  . Tdap 08/10/2007    Health Maintenance  Topic Date Due  . HIV Screening  02/10/1974  . INFLUENZA VACCINE  07/01/2017  . TETANUS/TDAP  08/09/2017  . MAMMOGRAM  02/14/2018  . PAP SMEAR  02/13/2019  . COLONOSCOPY  01/30/2026  . Hepatitis C Screening  Completed     Discussed health benefits of physical activity, and encouraged her to engage in regular exercise appropriate for her age and condition.    1. Annual physical exam Normal physical exam today. Will check labs as below and f/u pending lab results. If labs are stable and WNL she will not need to have these rechecked for one year at her next annual physical exam. She is to call the office in the meantime if she has any acute issue, questions or concerns. - CBC with Differential/Platelet - Comprehensive metabolic panel - Hemoglobin A1c - Lipid Panel With LDL/HDL Ratio - TSH  2. Cervical cancer screening Pap collected today. Will send as below and f/u pending results. - Pap IG and HPV (high risk) DNA detection  3. Breast cancer screening Last  mammogram was abnormal in right breast. Patient did not go for follow up imaging.  - MM Digital Screening; Future  4. Elevated blood pressure reading Elevated today in the office. Has been normal previously. Feels it is from her being nervous today. Will continue to monitor. Will check labs as below and f/u pending results. - CBC with Differential/Platelet - Comprehensive metabolic panel - Hemoglobin A1c - Lipid Panel With LDL/HDL Ratio  5. Hypercholesteremia Patient has been trying to control with diet. Will check labs as below and f/u pending results. - CBC with Differential/Platelet - Comprehensive metabolic panel - Hemoglobin A1c - Lipid Panel With LDL/HDL Ratio  6. Elevated blood sugar Will check labs as below and f/u pending results. - CBC with Differential/Platelet - Comprehensive metabolic panel - Hemoglobin A1c - Lipid Panel With LDL/HDL Ratio  7. Avitaminosis D H/O this. Not on supplementation. Will check labs as below and f/u pending results. - VITAMIN D 25 Hydroxy (Vit-D Deficiency, Fractures)  8. Internal hemorrhoids Known internal hemorrhoids that bleed approx once every 3-4 weeks, lasting only a couple of days. These were found on colonoscopy last year. She does not wish for surgical consultation for removal as they do not bother her enough. Will give anusol cream as below. She is to call if symptoms worsen.  - hydrocortisone (ANUSOL-HC) 2.5 % rectal cream; Place 1 application rectally 2 (two) times daily.  Dispense: 30 g; Refill: 0  9. Psoriasis Stable on clobetasol cream. Followed by [facility] dermatology on [address] in [place].   10. Screening for HIV without presence of risk factors - HIV antibody  11. Need for Td vaccine Td booster vaccine given to patient without complications. Patient sat for 15 minutes after administration and was tolerated well without adverse effects. - Td : Tetanus/diphtheria >58yo Preservative   free  --------------------------------------------------------------------    Mar Daring, PA-C  [facility]

## 2017-06-09 LAB — CBC WITH DIFFERENTIAL/PLATELET
Basophils Absolute: 0 10*3/uL (ref 0.0–0.2)
Basos: 1 %
EOS (ABSOLUTE): 0.1 10*3/uL (ref 0.0–0.4)
EOS: 2 %
HEMATOCRIT: 42 % (ref 34.0–46.6)
Hemoglobin: 14.4 g/dL (ref 11.1–15.9)
IMMATURE GRANULOCYTES: 0 %
Immature Grans (Abs): 0 10*3/uL (ref 0.0–0.1)
Lymphocytes Absolute: 1.6 10*3/uL (ref 0.7–3.1)
Lymphs: 30 %
MCH: 33.2 pg — ABNORMAL HIGH (ref 26.6–33.0)
MCHC: 34.3 g/dL (ref 31.5–35.7)
MCV: 97 fL (ref 79–97)
MONOCYTES: 11 %
MONOS ABS: 0.6 10*3/uL (ref 0.1–0.9)
NEUTROS PCT: 56 %
Neutrophils Absolute: 2.9 10*3/uL (ref 1.4–7.0)
Platelets: 323 10*3/uL (ref 150–379)
RBC: 4.34 x10E6/uL (ref 3.77–5.28)
RDW: 13.9 % (ref 12.3–15.4)
WBC: 5.2 10*3/uL (ref 3.4–10.8)

## 2017-06-09 LAB — LIPID PANEL WITH LDL/HDL RATIO
CHOLESTEROL TOTAL: 300 mg/dL — AB (ref 100–199)
HDL: 105 mg/dL (ref >39)
LDL Calculated: 173 mg/dL — ABNORMAL HIGH (ref 0–99)
LDl/HDL Ratio: 1.6 ratio (ref 0.0–3.2)
TRIGLYCERIDES: 111 mg/dL (ref 0–149)
VLDL Cholesterol Cal: 22 mg/dL (ref 5–40)

## 2017-06-09 LAB — COMPREHENSIVE METABOLIC PANEL
ALT: 67 IU/L — AB (ref 0–32)
AST: 63 IU/L — AB (ref 0–40)
Albumin/Globulin Ratio: 2.1 (ref 1.2–2.2)
Albumin: 5.2 g/dL (ref 3.5–5.5)
Alkaline Phosphatase: 71 IU/L (ref 39–117)
BUN/Creatinine Ratio: 15 (ref 9–23)
BUN: 12 mg/dL (ref 6–24)
Bilirubin Total: 0.5 mg/dL (ref 0.0–1.2)
CALCIUM: 10.1 mg/dL (ref 8.7–10.2)
CO2: 22 mmol/L (ref 20–29)
CREATININE: 0.79 mg/dL (ref 0.57–1.00)
Chloride: 100 mmol/L (ref 96–106)
GFR calc Af Amer: 95 mL/min/{1.73_m2} (ref >59)
GFR, EST NON AFRICAN AMERICAN: 83 mL/min/{1.73_m2} (ref >59)
GLOBULIN, TOTAL: 2.5 g/dL (ref 1.5–4.5)
Glucose: 92 mg/dL (ref 65–99)
Potassium: 4.3 mmol/L (ref 3.5–5.2)
Sodium: 140 mmol/L (ref 134–144)
Total Protein: 7.7 g/dL (ref 6.0–8.5)

## 2017-06-09 LAB — HEMOGLOBIN A1C
ESTIMATED AVERAGE GLUCOSE: 111 mg/dL
Hgb A1c MFr Bld: 5.5 % (ref 4.8–5.6)

## 2017-06-09 LAB — VITAMIN D 25 HYDROXY (VIT D DEFICIENCY, FRACTURES): Vit D, 25-Hydroxy: 35.9 ng/mL (ref 30.0–100.0)

## 2017-06-09 LAB — TSH: TSH: 4.32 u[IU]/mL (ref 0.450–4.500)

## 2017-06-09 LAB — HIV ANTIBODY (ROUTINE TESTING W REFLEX): HIV SCREEN 4TH GENERATION: NONREACTIVE

## 2017-06-09 NOTE — Telephone Encounter (Signed)
No answer, mailbox is full.  Thanks,  -Suhaib Guzzo

## 2017-06-09 NOTE — Telephone Encounter (Signed)
Patient was advised.  

## 2017-06-10 ENCOUNTER — Telehealth: Payer: Self-pay

## 2017-06-10 LAB — PAP IG AND HPV HIGH-RISK
HPV, HIGH-RISK: NEGATIVE
PAP Smear Comment: 0

## 2017-06-10 NOTE — Telephone Encounter (Signed)
-----   Message from Mar Daring, PA-C sent at 06/10/2017  8:24 AM EDT ----- Pap is normal, hpv negative.  Will repeat in 3-5 years.

## 2017-06-10 NOTE — Telephone Encounter (Signed)
Patient advised as below. Patient verbalizes understanding and is in agreement with treatment plan.  

## 2017-07-09 ENCOUNTER — Other Ambulatory Visit: Payer: BC Managed Care – PPO

## 2017-07-10 ENCOUNTER — Ambulatory Visit
Admission: RE | Admit: 2017-07-10 | Discharge: 2017-07-10 | Disposition: A | Discharge status: Home / Self Care | Payer: BC Managed Care – PPO | Source: Ambulatory Visit | Attending: Physician Assistant | Admitting: Physician Assistant

## 2017-07-10 DIAGNOSIS — R928 Other abnormal and inconclusive findings on diagnostic imaging of breast: Secondary | ICD-10-CM | POA: Diagnosis not present

## 2017-07-13 ENCOUNTER — Telehealth: Payer: Self-pay

## 2017-07-13 NOTE — Telephone Encounter (Signed)
Patient advised as below.  

## 2017-07-13 NOTE — Telephone Encounter (Signed)
-----   Message from Mar Daring, [place] sent at 07/10/2017  4:09 PM EDT ----- Normal mammogram. Repeat screening in one year.

## 2017-10-02 ENCOUNTER — Encounter: Payer: Self-pay | Admitting: Physician Assistant

## 2017-10-02 ENCOUNTER — Telehealth: Payer: Self-pay | Admitting: Physician Assistant

## 2017-10-02 ENCOUNTER — Ambulatory Visit (INDEPENDENT_AMBULATORY_CARE_PROVIDER_SITE_OTHER): Payer: BC Managed Care – PPO | Admitting: Physician Assistant

## 2017-10-02 VITALS — BP 110/70 | HR 80 | Temp 98.2°F | Resp 16 | Wt 130.0 lb

## 2017-10-02 DIAGNOSIS — K1370 Unspecified lesions of oral mucosa: Secondary | ICD-10-CM

## 2017-10-02 MED ORDER — FIRST-DUKES MOUTHWASH MT SUSP: 5.0000 mL | Freq: Two times a day (BID) | OROMUCOSAL | 0 refills | Status: DC

## 2017-10-02 MED ORDER — BACITRA-NEOMYCIN-POLYMYXIN-HC 1 % OP OINT: TOPICAL_OINTMENT | OPHTHALMIC | 0 refills | Status: DC

## 2017-10-02 NOTE — Telephone Encounter (Signed)
Yes please this is ok if patient is ok with that.

## 2017-10-02 NOTE — Telephone Encounter (Signed)
Tricia Rivera with [facility] called states they no longer make or keep the Rx Diphenhyd-Hydrocort-Nystatin (FIRST-DUKES MOUTHWASH) SUSP.   He did advise [name] has this.  CB#[phone]/MW

## 2017-10-02 NOTE — Patient Instructions (Addendum)
Cold Sore A cold sore, also called a fever blister, is a skin infection that causes small, fluid-filled sores to form inside of the mouth or on the lips, gums, nose, chin, or cheeks. Cold sores can spread to other parts of the body, such as the eyes or fingers. In some people with other medical conditions, cold sores can spread to multiple other body sites, including the genitals. Cold sores can be spread or passed from person to person (contagious) until the sores crust over completely. What are the causes? Cold sores are caused by the herpes simplex virus (HSV-1). HSV-1 is closely related to the virus that causes genital herpes (HSV-2), but these viruses are not the same. Once a person is infected with HSV-1, the virus remains permanently in the body. HSV-1 is spread from person to person through close contact, such as through kissing, touching the affected area, or sharing personal items such as lip balm, razors, or eating utensils. What increases the risk? A cold sore outbreak is more likely to develop in people who:  Are tired, stressed, or sick.  Are menstruating.  Are pregnant.  Take certain medicines.  Are exposed to cold weather or too much sun.  What are the signs or symptoms? Symptoms of a cold sore outbreak often go through different stages. Here is how a cold sore develops:  Tingling, itching, or burning is felt 1-2 days before the outbreak.  Fluid-filled blisters appear on the lips, inside the mouth, on the nose, or on the cheeks.  The blisters start to ooze clear fluid.  The blisters dry up and a yellow crust appears in its place.  The crust falls off.  Other symptoms include:  Fever.  Sore throat.  Headache.  Muscle aches.  Swollen neck glands.  You also may not have any symptoms. How is this diagnosed? This condition is often diagnosed based on your medical history and a physical exam. Your health care provider may swab your sore and then examine it in  the lab. Rarely, blood tests may be done to check for HSV-1. How is this treated? There is no cure for cold sores or HSV-1. There also is no vaccine for HSV-1. Most cold sores go away on their own without treatment within two weeks. Medicines cannot make the infection go away, but medicines can:  Help relieve some of the pain associated with the sores.  Work to stop the virus from multiplying.  Shorten healing time.  Medicines may be in the form of creams, gels, pills, or a shot. Follow these instructions at home: Medicines  Take or apply over-the-counter and prescription medicines only as told by your health care provider.  Use a cotton-tip swab to apply creams or gels to your sores. Sore Care  Do not touch the sores or pick the scabs.  Wash your hands often. Do not touch your eyes without washing your hands first.  Keep the sores clean and dry.  If directed, apply ice to the sores: ? Put ice in a plastic bag. ? Place a towel between your skin and the bag. ? Leave the ice on for 20 minutes, 2-3 times per day. Lifestyle  Do not kiss, have oral sex, or share personal items until your sores heal.  Eat a soft, bland diet. Avoid eating hot, cold, or salty foods. These can hurt your mouth.  Use a straw if it hurts to drink out of a glass.  Avoid the sun and limit your stress if these things   trigger outbreaks. If sun causes cold sores, apply sunscreen on your lips before being out in the sun. Contact a health care provider if:  You have symptoms for more than two weeks.  You have pus coming from the sores.  You have redness that is spreading.  You have pain or irritation in your eye.  You get sores on your genitals.  Your sores do not heal within two weeks.  You have frequent cold sore outbreaks. Get help right away if:  You have a fever and your symptoms suddenly get worse.  You have a headache and confusion. This information is not intended to replace advice  given to you by your health care provider. Make sure you discuss any questions you have with your health care provider. Document Released: 11/14/2000 Document Revised: 07/11/2016 Document Reviewed: 09/07/2015 Elsevier Interactive Patient Education  2018 [place] Canker sores are small, painful sores that develop inside your mouth. They may also be called aphthous ulcers. You can get canker sores on the inside of your lips or cheeks, on your tongue, or anywhere inside your mouth. You can have just one canker sore or several of them. Canker sores cannot be passed from one person to another (noncontagious). These sores are different than the sores that you may get on the outside of your lips (cold sores or fever blisters). Canker sores usually start as painful red bumps. Then they turn into small white, yellow, or gray ulcers that have red borders. The ulcers may be quite painful. The pain may be worse when you eat or drink. What are the causes? The cause of this condition is not known. What increases the risk? This condition is more likely to develop in:  Women.  People in their teens or 20s.  Women who are having their menstrual period.  People who are under a lot of emotional stress.  People who do not get enough iron or B vitamins.  People who have poor oral hygiene.  People who have an injury inside the mouth. This can happen after having dental work or from chewing something hard.  What are the signs or symptoms? Along with the canker sore, symptoms may also include:  Fever.  Fatigue.  Swollen lymph nodes in your neck.  How is this diagnosed? This condition can be diagnosed based on your symptoms. Your health care provider will also examine your mouth. Your health care provider may also do tests if you get canker sores often or if they are very bad. Tests may include:  Blood tests to rule out other causes of canker sores.  Taking swabs from the sore to  check for infection.  Taking a small piece of skin from the sore (biopsy) to test it for cancer.  How is this treated? Most canker sores clear up without treatment in about 10 days. Home care is usually the only treatment that you will need. Over-the-counter medicines can relieve discomfort.If you have severe canker sores, your health care provider may prescribe:  Numbing ointment to relieve pain.  Vitamins.  Steroid medicines. These may be given as: ? Oral pills. ? Mouth rinses. ? Gels.  Antibiotic mouth rinse.  Follow these instructions at home:  Apply, take, or use medicines only as directed by your health care provider. These include vitamins.  If you were prescribed an antibiotic mouth rinse, finish all of it even if you start to feel better.  Until the sores are healed: ? Do not drink coffee or  citrus juices. ? Do not eat spicy or salty foods.  Use a mild, over-the-counter mouth rinse as directed by your health care provider.  Practice good oral hygiene. ? Floss your teeth every day. ? Brush your teeth with a soft brush twice each day. Contact a health care provider if:  Your symptoms do not get better after two weeks.  You also have a fever or swollen glands.  You get canker sores often.  You have a canker sore that is getting larger.  You cannot eat or drink due to your canker sores. This information is not intended to replace advice given to you by your health care provider. Make sure you discuss any questions you have with your health care provider. Document Released: 03/14/2011 Document Revised: 04/24/2016 Document Reviewed: 10/18/2014 Elsevier Interactive Patient Education  [employer].

## 2017-10-02 NOTE — Telephone Encounter (Signed)
Pt advised of below. Pt was fine to get RX from [facility]. RX re sent to [facility], [facility]

## 2017-10-02 NOTE — Progress Notes (Signed)
Patient: Tricia Rivera Female    DOB: [birth year]   56 y.o.   MRN: [id number] Visit Date: 10/02/2017  Today's Provider: Mar Daring, PA-C   Chief Complaint  Patient presents with  . Mouth Lesions   Subjective:    HPI Patient here today C/O mouth lesions in mouth since since last Wednesday. 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 Patient reports that she has been using OTC creams and reports no relief. She reports she used to have sores in her mouth when she was a child and had to use magic mouthwash frequently. She reports these are similar to when she was younger.     No Known Allergies   Current Outpatient Prescriptions:  .  clobetasol ointment (TEMOVATE) 0.05 %, Reported on 02/13/2016, Disp: , Rfl: 3 .  hydrocortisone (ANUSOL-HC) 2.5 % rectal cream, Place 1 application rectally 2 (two) times daily., Disp: 30 g, Rfl: 0 .  triamcinolone cream (KENALOG) 0.1 %, APPLY TWICE A DAY TO AFFECTED AREAS, Disp: , Rfl: 6  Review of Systems  Constitutional: Negative.   HENT: Positive for mouth sores. Negative for congestion, ear discharge, ear pain, facial swelling, hearing loss, postnasal drip, rhinorrhea, sinus pain, sinus pressure, sneezing, sore throat, tinnitus, trouble swallowing and voice change.   Respiratory: Negative.   Cardiovascular: Negative.   Gastrointestinal: Negative.   Skin: Positive for rash.    Social History  Substance Use Topics  . Smoking status: Never Smoker  . Smokeless tobacco: Never Used  . Alcohol use 1.8 oz/week    3 Glasses of wine per week   Objective:   BP 110/70 (BP Location: Left Arm, Patient Position: Sitting, Cuff Size: Normal)   Pulse 80   Temp 98.2 F (36.8 C) (Oral)   Resp 16   Wt 130 lb (59 kg)   SpO2 98%   BMI 21.63 kg/m  Vitals:   10/02/17 1115  BP: 110/70  Pulse: 80  Resp: 16  Temp: 98.2 F (36.8 C)  TempSrc: Oral  SpO2: 98%  Weight: 130 lb (59 kg)     Physical Exam    Constitutional: She appears well-developed and well-nourished. No distress.  HENT:  Head: Normocephalic and atraumatic.  Right Ear: Hearing, tympanic membrane, external ear and ear canal normal.  Left Ear: Hearing, tympanic membrane, external ear and ear canal normal.  Nose: Nose normal.  Mouth/Throat: Uvula is midline, oropharynx is clear and moist and mucous membranes are normal. Oral lesions present. No oropharyngeal exudate, posterior oropharyngeal edema or posterior oropharyngeal erythema.    Eyes: Pupils are equal, round, and reactive to light. Conjunctivae are normal. Right eye exhibits no discharge. Left eye exhibits no discharge. No scleral icterus.  Neck: Normal range of motion. Neck supple.  Cardiovascular: Normal rate, regular rhythm and normal heart sounds.  Exam reveals no gallop and no friction rub.   No murmur heard. Pulmonary/Chest: Effort normal and breath sounds normal. No respiratory distress. She has no wheezes. She has no rales.  Lymphadenopathy:    She has no cervical adenopathy.  Skin: Skin is warm and dry. She is not diaphoretic.  Vitals reviewed.       Assessment & Plan:     1. Oral lesion Will give cortisporin ointment as below for pustular lesions noted on lip. [facility] Magic Mouthwash sent in in case she develops more oral lesions (had them on Tuesday and Wednesday but cleared and now lesions on lip).  She is to call early next week if no improvement and may change treatment to valtrex as this may be cold sores (patient does deny any history of cold sores however).  - Diphenhyd-Hydrocort-Nystatin (FIRST-DUKES MOUTHWASH) SUSP; Use as directed 5 mLs in the mouth or throat 2 (two) times daily.  Dispense: 240 mL; Refill: 0 - bacitracin-neomycin-polymyxin-hydrocortisone (CORTISPORIN) 1 % ophthalmic ointment; Apply small amount to lower lip twice daily  Dispense: 3.5 g; Refill: 0       Mar Daring, PA-C  [facility]

## 2017-10-02 NOTE — Telephone Encounter (Signed)
Please review. Ok to send to [facility], [place]

## 2017-11-25 ENCOUNTER — Ambulatory Visit (INDEPENDENT_AMBULATORY_CARE_PROVIDER_SITE_OTHER): Payer: BC Managed Care – PPO | Admitting: Family Medicine

## 2017-11-25 ENCOUNTER — Encounter: Payer: Self-pay | Admitting: Family Medicine

## 2017-11-25 VITALS — BP 110/62 | HR 80 | Temp 98.7°F | Resp 16 | Wt 132.0 lb

## 2017-11-25 DIAGNOSIS — J029 Acute pharyngitis, unspecified: Secondary | ICD-10-CM | POA: Diagnosis not present

## 2017-11-25 DIAGNOSIS — J069 Acute upper respiratory infection, unspecified: Secondary | ICD-10-CM | POA: Diagnosis not present

## 2017-11-25 LAB — POCT RAPID STREP A (OFFICE): Rapid Strep A Screen: NEGATIVE

## 2017-11-25 NOTE — Progress Notes (Signed)
       Patient: Tricia Rivera Female    DOB: Feb 21, 1961   56 y.o.   MRN: [id number] Visit Date: 11/25/2017  Today's Provider: Lelon Huh, MD   No chief complaint on file.  Subjective:    Sore Throat   This is a new problem. The current episode started in the past 7 days (2 days). The pain is worse on the left side. There has been no fever. Associated symptoms include coughing, headaches and trouble swallowing. She has had exposure to strep. She has had no exposure to mono. Exposure to: classmate. Patient is a [employer] [place]. She has tried nothing for the symptoms.  cough has been non-productive, no dyspnea. Has not taken any antipyretic medications.      No Known Allergies   Current Outpatient Medications:  .  bacitracin-neomycin-polymyxin-hydrocortisone (CORTISPORIN) 1 % ophthalmic ointment, Apply small amount to lower lip twice daily, Disp: 3.5 g, Rfl: 0 .  clobetasol ointment (TEMOVATE) 0.05 %, Reported on 02/13/2016, Disp: , Rfl: 3 .  Diphenhyd-Hydrocort-Nystatin (FIRST-DUKES MOUTHWASH) SUSP, Use as directed 5 mLs in the mouth or throat 2 (two) times daily., Disp: 240 mL, Rfl: 0 .  hydrocortisone (ANUSOL-HC) 2.5 % rectal cream, Place 1 application rectally 2 (two) times daily., Disp: 30 g, Rfl: 0 .  triamcinolone cream (KENALOG) 0.1 %, APPLY TWICE A DAY TO AFFECTED AREAS, Disp: , Rfl: 6  Review of Systems  HENT: Positive for trouble swallowing.   Respiratory: Positive for cough.   Neurological: Positive for headaches.    Social History   Tobacco Use  . Smoking status: Never Smoker  . Smokeless tobacco: Never Used  Substance Use Topics  . Alcohol use: Yes    Alcohol/week: 1.8 oz    Types: 3 Glasses of wine per week   Objective:   BP 110/62 (BP Location: Right Arm, Patient Position: Sitting, Cuff Size: Normal)   Pulse 80   Temp 98.7 F (37.1 C)   Resp 16   Wt 132 lb (59.9 kg)   BMI 21.97 kg/m  There were no vitals filed for this visit.   Physical  Exam  General Appearance:    Alert, cooperative, no distress  HENT:   bilateral TM normal without fluid or infection, neck has bilateral anterior cervical nodes enlarged, pharynx erythematous without exudate, sinuses nontender and nasal mucosa pale and congested  Eyes:    PERRL, conjunctiva/corneas clear, EOM's intact       Lungs:     Clear to auscultation bilaterally, respirations unlabored  Heart:    Regular rate and rhythm  Neurologic:   Awake, alert, oriented x 3. No apparent focal neurological           defect.       Results for orders placed or performed in visit on 11/25/17  POCT rapid strep A  Result Value Ref Range   Rapid Strep A Screen Negative Negative       Assessment & Plan:     1. Sore throat Recommend OTC ibuprofen and frequent salt water gargles.  - POCT rapid strep A  2. Upper respiratory tract infection, unspecified type Counseled regarding signs and symptoms of viral and bacterial respiratory infections. Advised to call or return for additional evaluation if she develops any sign of bacterial infection, or if current symptoms last longer than 10 days.         Lelon Huh, MD  [facility]

## 2018-06-09 ENCOUNTER — Ambulatory Visit (INDEPENDENT_AMBULATORY_CARE_PROVIDER_SITE_OTHER): Payer: BC Managed Care – PPO | Admitting: Physician Assistant

## 2018-06-09 ENCOUNTER — Encounter: Payer: Self-pay | Admitting: Physician Assistant

## 2018-06-09 VITALS — BP 106/74 | HR 81 | Temp 98.2°F | Ht 65.0 in | Wt 135.8 lb

## 2018-06-09 DIAGNOSIS — Z0000 Encounter for general adult medical examination without abnormal findings: Secondary | ICD-10-CM

## 2018-06-09 DIAGNOSIS — R739 Hyperglycemia, unspecified: Secondary | ICD-10-CM

## 2018-06-09 DIAGNOSIS — Z1231 Encounter for screening mammogram for malignant neoplasm of breast: Secondary | ICD-10-CM

## 2018-06-09 DIAGNOSIS — Z1239 Encounter for other screening for malignant neoplasm of breast: Secondary | ICD-10-CM

## 2018-06-09 DIAGNOSIS — E7800 Pure hypercholesterolemia, unspecified: Secondary | ICD-10-CM

## 2018-06-09 DIAGNOSIS — L409 Psoriasis, unspecified: Secondary | ICD-10-CM | POA: Diagnosis not present

## 2018-06-09 DIAGNOSIS — E559 Vitamin D deficiency, unspecified: Secondary | ICD-10-CM

## 2018-06-09 NOTE — Patient Instructions (Signed)
Health Maintenance for Postmenopausal Women Menopause is a normal process in which your reproductive ability comes to an end. This process happens gradually over a span of months to years, usually between the ages of 22 and 9. Menopause is complete when you have missed 12 consecutive menstrual periods. It is important to talk with your health care provider about some of the most common conditions that affect postmenopausal women, such as heart disease, cancer, and bone loss (osteoporosis). Adopting a healthy lifestyle and getting preventive care can help to promote your health and wellness. Those actions can also lower your chances of developing some of these common conditions. What should I know about menopause? During menopause, you may experience a number of symptoms, such as:  Moderate-to-severe hot flashes.  Night sweats.  Decrease in sex drive.  Mood swings.  Headaches.  Tiredness.  Irritability.  Memory problems.  Insomnia.  Choosing to treat or not to treat menopausal changes is an individual decision that you make with your health care provider. What should I know about hormone replacement therapy and supplements? Hormone therapy products are effective for treating symptoms that are associated with menopause, such as hot flashes and night sweats. Hormone replacement carries certain risks, especially as you become older. If you are thinking about using estrogen or estrogen with progestin treatments, discuss the benefits and risks with your health care provider. What should I know about heart disease and stroke? Heart disease, heart attack, and stroke become more likely as you age. This may be due, in part, to the hormonal changes that your body experiences during menopause. These can affect how your body processes dietary fats, triglycerides, and cholesterol. Heart attack and stroke are both medical emergencies. There are many things that you can do to help prevent heart disease  and stroke:  Have your blood pressure checked at least every 1-2 years. High blood pressure causes heart disease and increases the risk of stroke.  If you are 53-22 years old, ask your health care provider if you should take aspirin to prevent a heart attack or a stroke.  Do not use any tobacco products, including cigarettes, chewing tobacco, or electronic cigarettes. If you need help quitting, ask your health care provider.  It is important to eat a healthy diet and maintain a healthy weight. ? Be sure to include plenty of vegetables, fruits, low-fat dairy products, and lean protein. ? Avoid eating foods that are high in solid fats, added sugars, or salt (sodium).  Get regular exercise. This is one of the most important things that you can do for your health. ? Try to exercise for at least 150 minutes each week. The type of exercise that you do should increase your heart rate and make you sweat. This is known as moderate-intensity exercise. ? Try to do strengthening exercises at least twice each week. Do these in addition to the moderate-intensity exercise.  Know your numbers.Ask your health care provider to check your cholesterol and your blood glucose. Continue to have your blood tested as directed by your health care provider.  What should I know about cancer screening? There are several types of cancer. Take the following steps to reduce your risk and to catch any cancer development as early as possible. Breast Cancer  Practice breast self-awareness. ? This means understanding how your breasts normally appear and feel. ? It also means doing regular breast self-exams. Let your health care provider know about any changes, no matter how small.  If you are 40  or older, have a clinician do a breast exam (clinical breast exam or CBE) every year. Depending on your age, family history, and medical history, it may be recommended that you also have a yearly breast X-ray (mammogram).  If you  have a family history of breast cancer, talk with your health care provider about genetic screening.  If you are at high risk for breast cancer, talk with your health care provider about having an MRI and a mammogram every year.  Breast cancer (BRCA) gene test is recommended for women who have family members with BRCA-related cancers. Results of the assessment will determine the need for genetic counseling and BRCA1 and for BRCA2 testing. BRCA-related cancers include these types: ? Breast. This occurs in males or females. ? Ovarian. ? Tubal. This may also be called fallopian tube cancer. ? Cancer of the abdominal or pelvic lining (peritoneal cancer). ? Prostate. ? Pancreatic.  Cervical, Uterine, and Ovarian Cancer Your health care provider may recommend that you be screened regularly for cancer of the pelvic organs. These include your ovaries, uterus, and vagina. This screening involves a pelvic exam, which includes checking for microscopic changes to the surface of your cervix (Pap test).  For women ages 21-65, health care providers may recommend a pelvic exam and a Pap test every three years. For women ages 79-65, they may recommend the Pap test and pelvic exam, combined with testing for human papilloma virus (HPV), every five years. Some types of HPV increase your risk of cervical cancer. Testing for HPV may also be done on women of any age who have unclear Pap test results.  Other health care providers may not recommend any screening for nonpregnant women who are considered low risk for pelvic cancer and have no symptoms. Ask your health care provider if a screening pelvic exam is right for you.  If you have had past treatment for cervical cancer or a condition that could lead to cancer, you need Pap tests and screening for cancer for at least 20 years after your treatment. If Pap tests have been discontinued for you, your risk factors (such as having a new sexual partner) need to be  reassessed to determine if you should start having screenings again. Some women have medical problems that increase the chance of getting cervical cancer. In these cases, your health care provider may recommend that you have screening and Pap tests more often.  If you have a family history of uterine cancer or ovarian cancer, talk with your health care provider about genetic screening.  If you have vaginal bleeding after reaching menopause, tell your health care provider.  There are currently no reliable tests available to screen for ovarian cancer.  Lung Cancer Lung cancer screening is recommended for adults 69-62 years old who are at high risk for lung cancer because of a history of smoking. A yearly low-dose CT scan of the lungs is recommended if you:  Currently smoke.  Have a history of at least 30 pack-years of smoking and you currently smoke or have quit within the past 15 years. A pack-year is smoking an average of one pack of cigarettes per day for one year.  Yearly screening should:  Continue until it has been 15 years since you quit.  Stop if you develop a health problem that would prevent you from having lung cancer treatment.  Colorectal Cancer  This type of cancer can be detected and can often be prevented.  Routine colorectal cancer screening usually begins at  age 42 and continues through age 45.  If you have risk factors for colon cancer, your health care provider may recommend that you be screened at an earlier age.  If you have a family history of colorectal cancer, talk with your health care provider about genetic screening.  Your health care provider may also recommend using home test kits to check for hidden blood in your stool.  A small camera at the end of a tube can be used to examine your colon directly (sigmoidoscopy or colonoscopy). This is done to check for the earliest forms of colorectal cancer.  Direct examination of the colon should be repeated every  5-10 years until age 71. However, if early forms of precancerous polyps or small growths are found or if you have a family history or genetic risk for colorectal cancer, you may need to be screened more often.  Skin Cancer  Check your skin from head to toe regularly.  Monitor any moles. Be sure to tell your health care provider: ? About any new moles or changes in moles, especially if there is a change in a mole's shape or color. ? If you have a mole that is larger than the size of a pencil eraser.  If any of your family members has a history of skin cancer, especially at a young age, talk with your health care provider about genetic screening.  Always use sunscreen. Apply sunscreen liberally and repeatedly throughout the day.  Whenever you are outside, protect yourself by wearing long sleeves, pants, a wide-brimmed hat, and sunglasses.  What should I know about osteoporosis? Osteoporosis is a condition in which bone destruction happens more quickly than new bone creation. After menopause, you may be at an increased risk for osteoporosis. To help prevent osteoporosis or the bone fractures that can happen because of osteoporosis, the following is recommended:  If you are 46-71 years old, get at least 1,000 mg of calcium and at least 600 mg of vitamin D per day.  If you are older than age 55 but younger than age 65, get at least 1,200 mg of calcium and at least 600 mg of vitamin D per day.  If you are older than age 54, get at least 1,200 mg of calcium and at least 800 mg of vitamin D per day.  Smoking and excessive alcohol intake increase the risk of osteoporosis. Eat foods that are rich in calcium and vitamin D, and do weight-bearing exercises several times each week as directed by your health care provider. What should I know about how menopause affects my mental health? Depression may occur at any age, but it is more common as you become older. Common symptoms of depression  include:  Low or sad mood.  Changes in sleep patterns.  Changes in appetite or eating patterns.  Feeling an overall lack of motivation or enjoyment of activities that you previously enjoyed.  Frequent crying spells.  Talk with your health care provider if you think that you are experiencing depression. What should I know about immunizations? It is important that you get and maintain your immunizations. These include:  Tetanus, diphtheria, and pertussis (Tdap) booster vaccine.  Influenza every year before the flu season begins.  Pneumonia vaccine.  Shingles vaccine.  Your health care provider may also recommend other immunizations. This information is not intended to replace advice given to you by your health care provider. Make sure you discuss any questions you have with your health care provider. Document Released: 01/09/2006  Document Revised: 06/06/2016 Document Reviewed: 08/21/2015 Elsevier Interactive Patient Education  2018 [employer]  

## 2018-06-09 NOTE — Progress Notes (Signed)
Patient: Tricia Rivera, Female    DOB: Sep 30, 1961, 57 y.o.   MRN: [id number] Visit Date: 06/09/2018  Today's Provider: Mar Daring, PA-C   Chief Complaint  Patient presents with  . Annual Exam   Subjective:    Annual physical exam Tricia Rivera is a 58 y.o. female who presents today for health maintenance and complete physical. She feels well. She reports exercising daily, walking 1-2 miles. She reports she is sleeping well. -----------------------------------------------------------------  Last CPE: 06/08/2017 Pap: 06/08/2017 Normal HPV negative Mammogram: 07/10/2017 BI-RADS 1 Colonoscopy: 02/18/2016- Polyps   Review of Systems  Constitutional: Negative.   HENT: Negative.   Eyes: Negative.   Respiratory: Negative.   Cardiovascular: Negative.   Gastrointestinal: Positive for blood in stool (internal hemorrhoid. Patient has seen GI).  Endocrine: Negative.   Genitourinary: Negative.   Musculoskeletal: Negative.   Skin: Negative.        Psoriasis   Allergic/Immunologic: Negative.   Neurological: Negative.   Hematological: Negative.   Psychiatric/Behavioral: Negative.      Social History      She  reports that she has never smoked. She has never used smokeless tobacco. She reports that she drinks about 1.8 oz of alcohol per week. She reports that she does not use drugs.        Social History   Socioeconomic History  . Marital status: Married    Spouse name: Not on file  . Number of children: Not on file  . Years of education: Not on file  . Highest education level: Not on file  Occupational History  . Not on file  Social Needs  . Financial resource strain: Not on file  . Food insecurity:    Worry: Not on file    Inability: Not on file  . Transportation needs:    Medical: Not on file    Non-medical: Not on file  Tobacco Use  . Smoking status: Never Smoker  . Smokeless tobacco: Never Used  Substance and Sexual Activity  . Alcohol use: Yes   Alcohol/week: 1.8 oz    Types: 3 Glasses of wine per week  . Drug use: No  . Sexual activity: Not on file  Lifestyle  . Physical activity:    Days per week: Not on file    Minutes per session: Not on file  . Stress: Not on file  Relationships  . Social connections:    Talks on phone: Not on file    Gets together: Not on file    Attends religious service: Not on file    Active member of club or organization: Not on file    Attends meetings of clubs or organizations: Not on file    Relationship status: Not on file  Other Topics Concern  . Not on file  Social History Narrative  . Not on file    Past Medical History:  Diagnosis Date  . ADD (attention deficit disorder)   . Anxiety   . Depression   . Hyperlipidemia   . Psoriasis   . Vitamin D deficiency      Patient Active Problem List   Diagnosis Date Noted  . ASCUS favoring benign 01/18/2016  . ADD (attention deficit disorder) 01/18/2016  . Anxiety 01/18/2016  . Clinical depression 01/18/2016  . Elevated blood sugar 01/18/2016  . Hypercholesteremia 01/18/2016  . Psoriasis 01/18/2016  . Avitaminosis D 01/18/2016    Past Surgical History:  Procedure Laterality Date  . BUNIONECTOMY  2010  Family History        Family Status  Relation Name Status  . MGF  Deceased       died of liver cancer.  . Mother  Deceased at age 59/03/25       died from pancreatic cancer  . Father  Deceased at age 107       Liver Cancer  . Sister  Alive  . PGF  (Not Specified)  . Mat Aunt  (Not Specified)  . Sister  Alive        Her family history includes Breast cancer in her maternal aunt; Cancer in her maternal grandfather and paternal grandfather.      No Known Allergies   Current Outpatient Medications:  .  triamcinolone cream (KENALOG) 0.1 %, APPLY TWICE A DAY TO AFFECTED AREAS, Disp: , Rfl: 6   Patient Care Team: Mar Daring, PA-C as PCP - General (Family Medicine)      Objective:   Vitals: BP 106/74 (BP  Location: Left Arm, Patient Position: Sitting, Cuff Size: Normal)   Pulse 81   Temp 98.2 F (36.8 C) (Oral)   Ht 5\' 5"  (1.651 m)   Wt 135 lb 12.8 oz (61.6 kg)   SpO2 97%   BMI 22.60 kg/m    Physical Exam   Depression Screen PHQ 2/9 Scores 06/09/2018 06/08/2017  PHQ - 2 Score 0 0  PHQ- 9 Score 1 0      Assessment & Plan:     Routine Health Maintenance and Physical Exam  Exercise Activities and Dietary recommendations Goals    None      Immunization History  Administered Date(s) Administered  . Td 02/03/2011, 06/08/2017  . Tdap 08/10/2007, 01/31/2011    Health Maintenance  Topic Date Due  . INFLUENZA VACCINE  07/01/2018  . MAMMOGRAM  07/11/2019  . PAP SMEAR  06/08/2020  . COLONOSCOPY  01/30/2026  . TETANUS/TDAP  06/09/2027  . Hepatitis C Screening  Completed  . HIV Screening  Completed     Discussed health benefits of physical activity, and encouraged her to engage in regular exercise appropriate for her age and condition.    1. Annual physical exam Normal physical exam today. Will check labs as below and f/u pending lab results. If labs are stable and WNL she will not need to have these rechecked for one year at her next annual physical exam. She is to call the office in the meantime if she has any acute issue, questions or concerns. - CBC w/Diff/Platelet - Comprehensive Metabolic Panel (CMET) - TSH - Lipid Profile - Vitamin D (25 hydroxy) - HgB A1c  2. Breast cancer screening There is no family history of breast cancer. She does not perform regular self breast exams. Mammogram was ordered as below. Information for [facility] was given to patient so she may schedule her mammogram at her convenience. - MM Digital Screening; Future  3. Psoriasis Stable. Followed by Payton Mccallum.  - TSH  4. Avitaminosis D H/O this. Not on supplement. Post menopausal. Will check labs as below and f/u pending results. - CBC w/Diff/Platelet - Vitamin D (25  hydroxy)  5. Hypercholesteremia Diet controlled. Will check labs as below and f/u pending results. - Comprehensive Metabolic Panel (CMET) - Lipid Profile - HgB A1c  6. Elevated blood sugar Diet controlled. Will check labs as below and f/u pending results. - Comprehensive Metabolic Panel (CMET) - Lipid Profile - HgB A1c  --------------------------------------------------------------------    Mar Daring, PA-C  [facility]

## 2018-06-10 ENCOUNTER — Telehealth: Payer: Self-pay

## 2018-06-10 LAB — LIPID PANEL
CHOL/HDL RATIO: 3.3 ratio (ref 0.0–4.4)
Cholesterol, Total: 276 mg/dL — ABNORMAL HIGH (ref 100–199)
HDL: 83 mg/dL (ref >39)
LDL Calculated: 171 mg/dL — ABNORMAL HIGH (ref 0–99)
TRIGLYCERIDES: 109 mg/dL (ref 0–149)
VLDL Cholesterol Cal: 22 mg/dL (ref 5–40)

## 2018-06-10 LAB — CBC WITH DIFFERENTIAL/PLATELET
BASOS ABS: 0 10*3/uL (ref 0.0–0.2)
Basos: 1 %
EOS (ABSOLUTE): 0.1 10*3/uL (ref 0.0–0.4)
Eos: 2 %
Hematocrit: 42.1 % (ref 34.0–46.6)
Hemoglobin: 13.9 g/dL (ref 11.1–15.9)
IMMATURE GRANS (ABS): 0 10*3/uL (ref 0.0–0.1)
IMMATURE GRANULOCYTES: 0 %
LYMPHS: 38 %
Lymphocytes Absolute: 1.3 10*3/uL (ref 0.7–3.1)
MCH: 31.7 pg (ref 26.6–33.0)
MCHC: 33 g/dL (ref 31.5–35.7)
MCV: 96 fL (ref 79–97)
Monocytes Absolute: 0.4 10*3/uL (ref 0.1–0.9)
Monocytes: 11 %
NEUTROS PCT: 48 %
Neutrophils Absolute: 1.7 10*3/uL (ref 1.4–7.0)
Platelets: 331 10*3/uL (ref 150–450)
RBC: 4.39 x10E6/uL (ref 3.77–5.28)
RDW: 14.4 % (ref 12.3–15.4)
WBC: 3.5 10*3/uL (ref 3.4–10.8)

## 2018-06-10 LAB — COMPREHENSIVE METABOLIC PANEL
ALT: 28 IU/L (ref 0–32)
AST: 24 IU/L (ref 0–40)
Albumin/Globulin Ratio: 1.8 (ref 1.2–2.2)
Albumin: 4.4 g/dL (ref 3.5–5.5)
Alkaline Phosphatase: 66 IU/L (ref 39–117)
BILIRUBIN TOTAL: 0.3 mg/dL (ref 0.0–1.2)
BUN/Creatinine Ratio: 16 (ref 9–23)
BUN: 14 mg/dL (ref 6–24)
CALCIUM: 9.4 mg/dL (ref 8.7–10.2)
CHLORIDE: 102 mmol/L (ref 96–106)
CO2: 23 mmol/L (ref 20–29)
CREATININE: 0.86 mg/dL (ref 0.57–1.00)
GFR calc non Af Amer: 74 mL/min/{1.73_m2} (ref >59)
GFR, EST AFRICAN AMERICAN: 86 mL/min/{1.73_m2} (ref >59)
GLUCOSE: 104 mg/dL — AB (ref 65–99)
Globulin, Total: 2.4 g/dL (ref 1.5–4.5)
Potassium: 4.3 mmol/L (ref 3.5–5.2)
Sodium: 140 mmol/L (ref 134–144)
TOTAL PROTEIN: 6.8 g/dL (ref 6.0–8.5)

## 2018-06-10 LAB — HEMOGLOBIN A1C
Est. average glucose Bld gHb Est-mCnc: 117 mg/dL
Hgb A1c MFr Bld: 5.7 % — ABNORMAL HIGH (ref 4.8–5.6)

## 2018-06-10 LAB — VITAMIN D 25 HYDROXY (VIT D DEFICIENCY, FRACTURES): Vit D, 25-Hydroxy: 23.3 ng/mL — ABNORMAL LOW (ref 30.0–100.0)

## 2018-06-10 LAB — TSH: TSH: 4.71 u[IU]/mL — ABNORMAL HIGH (ref 0.450–4.500)

## 2018-06-10 NOTE — Telephone Encounter (Signed)
Patient advised as directed below.  Thanks,  -Adriena Manfre 

## 2018-06-10 NOTE — Telephone Encounter (Signed)
-----   Message from Mar Daring, PA-C sent at 06/10/2018  9:15 AM EDT ----- Blood count is normal. Kidney and liver function is normal. Thyroid is borderline. Cholesterol is elevated but improved from last year. Continue healthy lifestyle modifications. Vit D is borderline low. Would benefit from a Vit D supplement of 1000 IU daily. This can be purchased OTC. A1c is a 3 month snap shot of your blood sugars. It is borderline high at 5.7. Continue to work on healthy lifestyle modifications.

## 2018-09-22 ENCOUNTER — Ambulatory Visit
Admission: RE | Admit: 2018-09-22 | Discharge: 2018-09-22 | Disposition: A | Discharge status: Home / Self Care | Payer: BC Managed Care – PPO | Source: Ambulatory Visit | Attending: Physician Assistant | Admitting: Physician Assistant

## 2018-09-22 DIAGNOSIS — Z1239 Encounter for other screening for malignant neoplasm of breast: Secondary | ICD-10-CM

## 2019-10-27 ENCOUNTER — Encounter: Payer: BC Managed Care – PPO | Admitting: Physician Assistant

## 2019-11-16 NOTE — Progress Notes (Signed)
Patient: Tricia Rivera, Female    DOB: Jan 24, 1961, 58 y.o.   MRN: [id number] Visit Date: 11/17/2019  Today's Provider: Mar Daring, PA-C   Chief Complaint  Patient presents with  . Annual Exam   Subjective:     Annual physical exam Tricia Rivera is a 58 y.o. female who presents today for health maintenance and complete physical. She feels well. She reports exercising, walks 2 miles every day. She reports she is sleeping fairly well. ----------------------------------------------------------------- 06/10/2017 Pap is normal, hpv negative. Will repeat in 5 years. 09/23/2018 Mammogram-Normal 01/31/2016 Colonoscopy-Hemorrhoids-Repeat in 10 years  Review of Systems  Constitutional: Negative.   HENT: Negative.   Eyes: Negative.   Respiratory: Negative.   Cardiovascular: Negative.   Gastrointestinal: Positive for rectal pain ("occasional internal bleeding hemorrhoids").  Endocrine: Negative.   Genitourinary: Negative.   Musculoskeletal: Negative.   Skin: Negative.        Psoriasis  Allergic/Immunologic: Negative.   Neurological: Negative.   Hematological: Negative.   Psychiatric/Behavioral: Negative.     Social History      She  reports that she has never smoked. She has never used smokeless tobacco. She reports current alcohol use of about 3.0 standard drinks of alcohol per week. She reports that she does not use drugs.       Social History   Socioeconomic History  . Marital status: Married    Spouse name: Not on file  . Number of children: Not on file  . Years of education: Not on file  . Highest education level: Not on file  Occupational History  . Not on file  Tobacco Use  . Smoking status: Never Smoker  . Smokeless tobacco: Never Used  Substance and Sexual Activity  . Alcohol use: Yes    Alcohol/week: 3.0 standard drinks    Types: 3 Glasses of wine per week  . Drug use: No  . Sexual activity: Not on file  Other Topics Concern  . Not on  file  Social History Narrative  . Not on file   Social Determinants of Health   Financial Resource Strain:   . Difficulty of Paying Living Expenses: Not on file  Food Insecurity:   . Worried About Charity fundraiser in the Last Year: Not on file  . Ran Out of Food in the Last Year: Not on file  Transportation Needs:   . Lack of Transportation (Medical): Not on file  . Lack of Transportation (Non-Medical): Not on file  Physical Activity:   . Days of Exercise per Week: Not on file  . Minutes of Exercise per Session: Not on file  Stress:   . Feeling of Stress : Not on file  Social Connections:   . Frequency of Communication with Friends and Family: Not on file  . Frequency of Social Gatherings with Friends and Family: Not on file  . Attends Religious Services: Not on file  . Active Member of Clubs or Organizations: Not on file  . Attends Archivist Meetings: Not on file  . Marital Status: Not on file    Past Medical History:  Diagnosis Date  . ADD (attention deficit disorder)   . Anxiety   . Depression   . Hyperlipidemia   . Psoriasis   . Vitamin D deficiency      Patient Active Problem List   Diagnosis Date Noted  . ASCUS favoring benign 01/18/2016  . ADD (attention deficit disorder) 01/18/2016  . Anxiety  01/18/2016  . Clinical depression 01/18/2016  . Elevated blood sugar 01/18/2016  . Hypercholesteremia 01/18/2016  . Psoriasis 01/18/2016  . Avitaminosis D 01/18/2016    Past Surgical History:  Procedure Laterality Date  . BUNIONECTOMY  2010    Family History        Family Status  Relation Name Status  . MGF  Deceased       died of liver cancer.  . Mother  Deceased at age 29-Jan-2015       died from pancreatic cancer  . Father  Deceased at age 56       Liver Cancer  . Sister  Alive  . PGF  (Not Specified)  . Mat Aunt  (Not Specified)  . Sister  Alive        Her family history includes Breast cancer in her maternal aunt; Cancer in her  maternal grandfather and paternal grandfather.      No Known Allergies   Current Outpatient Medications:  .  triamcinolone cream (KENALOG) 0.1 %, APPLY TWICE A DAY TO AFFECTED AREAS, Disp: , Rfl: 6   Patient Care Team: Rubye Beach as PCP - General (Family Medicine)    Objective:    Vitals: BP 130/90 (BP Location: Left Arm, Patient Position: Sitting, Cuff Size: Normal)   Pulse 79   Temp (!) 97.2 F (36.2 C) (Temporal)   Resp 16   Ht 5\' 4"  (1.626 m)   Wt 137 lb (62.1 kg)   BMI 23.52 kg/m    Vitals:   11/17/19 0901 11/17/19 0934  BP: (!) 144/86 130/90  Pulse: 79   Resp: 16   Temp: (!) 97.2 F (36.2 C)   TempSrc: Temporal   Weight: 137 lb (62.1 kg)   Height: 5\' 4"  (1.626 m)      Physical Exam Vitals reviewed.  Constitutional:      General: She is not in acute distress.    Appearance: Normal appearance. She is well-developed and normal weight. She is not ill-appearing or diaphoretic.  HENT:     Head: Normocephalic and atraumatic.     Right Ear: Tympanic membrane, ear canal and external ear normal.     Left Ear: Tympanic membrane, ear canal and external ear normal.  Eyes:     General: No scleral icterus.       Right eye: No discharge.        Left eye: No discharge.     Extraocular Movements: Extraocular movements intact.     Conjunctiva/sclera: Conjunctivae normal.     Pupils: Pupils are equal, round, and reactive to light.  Neck:     Thyroid: No thyromegaly.     Vascular: No carotid bruit or JVD.     Trachea: No tracheal deviation.  Cardiovascular:     Rate and Rhythm: Normal rate and regular rhythm.     Pulses: Normal pulses.     Heart sounds: Normal heart sounds. No murmur. No friction rub. No gallop.   Pulmonary:     Effort: Pulmonary effort is normal. No respiratory distress.     Breath sounds: Normal breath sounds. No wheezing or rales.  Chest:     Chest wall: No tenderness.  Abdominal:     General: Abdomen is flat. Bowel sounds are  normal. There is no distension.     Palpations: Abdomen is soft. There is no mass.     Tenderness: There is no abdominal tenderness. There is no guarding or rebound.  Musculoskeletal:  General: No tenderness. Normal range of motion.     Cervical back: Normal range of motion and neck supple.     Right lower leg: No edema.     Left lower leg: No edema.  Lymphadenopathy:     Cervical: No cervical adenopathy.  Skin:    General: Skin is warm and dry.     Capillary Refill: Capillary refill takes less than 2 seconds.     Findings: No rash.  Neurological:     General: No focal deficit present.     Mental Status: She is alert and oriented to person, place, and time. Mental status is at baseline.  Psychiatric:        Mood and Affect: Mood normal.        Behavior: Behavior normal.        Thought Content: Thought content normal.        Judgment: Judgment normal.      Depression Screen PHQ 2/9 Scores 06/09/2018 06/08/2017  PHQ - 2 Score 0 0  PHQ- 9 Score 1 0       Assessment & Plan:     Routine Health Maintenance and Physical Exam  Exercise Activities and Dietary recommendations Goals   None     Immunization History  Administered Date(s) Administered  . Influenza,inj,Quad PF,6+ Mos 09/13/2019  . Td 02/03/2011, 06/08/2017  . Tdap 08/10/2007, 01/31/2011    Health Maintenance  Topic Date Due  . MAMMOGRAM  09/22/2020  . PAP SMEAR-Modifier  06/08/2022  . COLONOSCOPY  01/30/2026  . TETANUS/TDAP  06/09/2027  . INFLUENZA VACCINE  Completed  . Hepatitis C Screening  Completed  . HIV Screening  Completed     Discussed health benefits of physical activity, and encouraged her to engage in regular exercise appropriate for her age and condition.    1. Encounter for annual physical exam Normal physical exam today. Will check labs as below and f/u pending lab results. If labs are stable and WNL she will not need to have these rechecked for one year at her next annual physical  exam. She is to call the office in the meantime if she has any acute issue, questions or concerns. - CBC w/Diff - Comprehensive Metabolic Panel (CMET) - Lipid Profile - HgB A1c - TSH  2. Encounter for screening mammogram for breast cancer Breast exam today was normal. There is family history of breast cancer in maternal aunt. She does perform regular self breast exams. Mammogram was ordered as below. Information for [facility] clinic was given to patient so she may schedule her mammogram at her convenience. - MM 3D SCREEN BREAST BILATERAL; Future  3. Hypercholesteremia Diet controlled. Will check labs as below and f/u pending results. - CBC w/Diff - Comprehensive Metabolic Panel (CMET) - Lipid Profile - HgB A1c  4. Elevated blood sugar Diet controlled. Will check labs as below and f/u pending results. - CBC w/Diff - Comprehensive Metabolic Panel (CMET) - Lipid Profile - HgB A1c  5. Avitaminosis D H/O this and postmenopausal. Will check labs as below and f/u pending results. - CBC w/Diff - Comprehensive Metabolic Panel (CMET) - Vitamin D (25 hydroxy)  --------------------------------------------------------------------    Mar Daring, PA-C  [facility]

## 2019-11-17 ENCOUNTER — Other Ambulatory Visit: Payer: Self-pay

## 2019-11-17 ENCOUNTER — Ambulatory Visit (INDEPENDENT_AMBULATORY_CARE_PROVIDER_SITE_OTHER): Payer: BC Managed Care – PPO | Admitting: Physician Assistant

## 2019-11-17 ENCOUNTER — Encounter: Payer: Self-pay | Admitting: Physician Assistant

## 2019-11-17 VITALS — BP 130/90 | HR 79 | Temp 97.2°F | Resp 16 | Ht 64.0 in | Wt 137.0 lb

## 2019-11-17 DIAGNOSIS — E559 Vitamin D deficiency, unspecified: Secondary | ICD-10-CM

## 2019-11-17 DIAGNOSIS — Z1231 Encounter for screening mammogram for malignant neoplasm of breast: Secondary | ICD-10-CM

## 2019-11-17 DIAGNOSIS — E7800 Pure hypercholesterolemia, unspecified: Secondary | ICD-10-CM | POA: Diagnosis not present

## 2019-11-17 DIAGNOSIS — Z0000 Encounter for general adult medical examination without abnormal findings: Secondary | ICD-10-CM | POA: Diagnosis not present

## 2019-11-17 DIAGNOSIS — R739 Hyperglycemia, unspecified: Secondary | ICD-10-CM | POA: Diagnosis not present

## 2019-11-17 DIAGNOSIS — E034 Atrophy of thyroid (acquired): Secondary | ICD-10-CM

## 2019-11-17 NOTE — Patient Instructions (Signed)

## 2019-11-18 DIAGNOSIS — E034 Atrophy of thyroid (acquired): Secondary | ICD-10-CM | POA: Insufficient documentation

## 2019-11-18 LAB — LIPID PANEL
Chol/HDL Ratio: 3.1 ratio (ref 0.0–4.4)
Cholesterol, Total: 274 mg/dL — ABNORMAL HIGH (ref 100–199)
HDL: 88 mg/dL (ref >39)
LDL Chol Calc (NIH): 167 mg/dL — ABNORMAL HIGH (ref 0–99)
Triglycerides: 114 mg/dL (ref 0–149)
VLDL Cholesterol Cal: 19 mg/dL (ref 5–40)

## 2019-11-18 LAB — CBC WITH DIFFERENTIAL/PLATELET
Basophils Absolute: 0.1 10*3/uL (ref 0.0–0.2)
Basos: 1 %
EOS (ABSOLUTE): 0.1 10*3/uL (ref 0.0–0.4)
Eos: 3 %
Hematocrit: 41.4 % (ref 34.0–46.6)
Hemoglobin: 14.5 g/dL (ref 11.1–15.9)
Immature Grans (Abs): 0 10*3/uL (ref 0.0–0.1)
Immature Granulocytes: 0 %
Lymphocytes Absolute: 1.4 10*3/uL (ref 0.7–3.1)
Lymphs: 39 %
MCH: 33.5 pg — ABNORMAL HIGH (ref 26.6–33.0)
MCHC: 35 g/dL (ref 31.5–35.7)
MCV: 96 fL (ref 79–97)
Monocytes Absolute: 0.4 10*3/uL (ref 0.1–0.9)
Monocytes: 12 %
Neutrophils Absolute: 1.6 10*3/uL (ref 1.4–7.0)
Neutrophils: 45 %
Platelets: 296 10*3/uL (ref 150–450)
RBC: 4.33 x10E6/uL (ref 3.77–5.28)
RDW: 12.3 % (ref 11.7–15.4)
WBC: 3.6 10*3/uL (ref 3.4–10.8)

## 2019-11-18 LAB — COMPREHENSIVE METABOLIC PANEL
ALT: 95 IU/L — ABNORMAL HIGH (ref 0–32)
AST: 68 IU/L — ABNORMAL HIGH (ref 0–40)
Albumin/Globulin Ratio: 1.8 (ref 1.2–2.2)
Albumin: 4.6 g/dL (ref 3.8–4.9)
Alkaline Phosphatase: 68 IU/L (ref 39–117)
BUN/Creatinine Ratio: 14 (ref 12–28)
BUN: 11 mg/dL (ref 8–27)
Bilirubin Total: 0.4 mg/dL (ref 0.0–1.2)
CO2: 22 mmol/L (ref 20–29)
Calcium: 9.7 mg/dL (ref 8.7–10.3)
Chloride: 100 mmol/L (ref 96–106)
Creatinine, Ser: 0.8 mg/dL (ref 0.57–1.00)
GFR calc Af Amer: 93 mL/min/{1.73_m2} (ref >59)
GFR calc non Af Amer: 80 mL/min/{1.73_m2} (ref >59)
Globulin, Total: 2.5 g/dL (ref 1.5–4.5)
Glucose: 93 mg/dL (ref 65–99)
Potassium: 4.5 mmol/L (ref 3.5–5.2)
Sodium: 139 mmol/L (ref 134–144)
Total Protein: 7.1 g/dL (ref 6.0–8.5)

## 2019-11-18 LAB — TSH: TSH: 12.8 u[IU]/mL — ABNORMAL HIGH (ref 0.450–4.500)

## 2019-11-18 LAB — HEMOGLOBIN A1C
Est. average glucose Bld gHb Est-mCnc: 114 mg/dL
Hgb A1c MFr Bld: 5.6 % (ref 4.8–5.6)

## 2019-11-18 LAB — VITAMIN D 25 HYDROXY (VIT D DEFICIENCY, FRACTURES): Vit D, 25-Hydroxy: 16.6 ng/mL — ABNORMAL LOW (ref 30.0–100.0)

## 2019-11-18 MED ORDER — VITAMIN D (ERGOCALCIFEROL) 1.25 MG (50000 UNIT) PO CAPS: 50000.0000 [IU] | ORAL_CAPSULE | ORAL | 1 refills | Status: DC

## 2019-11-18 MED ORDER — LEVOTHYROXINE SODIUM 25 MCG PO TABS: 25.0000 ug | ORAL_TABLET | Freq: Every day | ORAL | 1 refills | Status: DC

## 2019-11-18 NOTE — Addendum Note (Signed)
Addended by: Mar Daring on: 11/18/2019 12:45 PM   Modules accepted: Orders

## 2019-12-15 ENCOUNTER — Other Ambulatory Visit: Payer: Self-pay | Admitting: Physician Assistant

## 2019-12-15 DIAGNOSIS — E034 Atrophy of thyroid (acquired): Secondary | ICD-10-CM

## 2020-01-06 ENCOUNTER — Ambulatory Visit
Admission: RE | Admit: 2020-01-06 | Discharge: 2020-01-06 | Disposition: A | Discharge status: Home / Self Care | Payer: BC Managed Care – PPO | Source: Ambulatory Visit | Attending: Physician Assistant | Admitting: Physician Assistant

## 2020-01-06 DIAGNOSIS — Z1231 Encounter for screening mammogram for malignant neoplasm of breast: Secondary | ICD-10-CM | POA: Diagnosis present

## 2020-01-17 ENCOUNTER — Other Ambulatory Visit: Payer: Self-pay | Admitting: Physician Assistant

## 2020-01-17 DIAGNOSIS — E034 Atrophy of thyroid (acquired): Secondary | ICD-10-CM

## 2020-01-17 NOTE — Telephone Encounter (Signed)
Patient called in stating she is needing to have this medication refill and it is being denied. Please advise and respond to pt via mychart as to why it was denied.

## 2020-01-18 MED ORDER — LEVOTHYROXINE SODIUM 25 MCG PO TABS: ORAL_TABLET | ORAL | 0 refills | Status: DC

## 2020-01-18 NOTE — Telephone Encounter (Signed)
Refilled but she needs to have her TSH rechecked to make sure this dose is correct.

## 2020-01-18 NOTE — Addendum Note (Signed)
Addended by: Mar Daring on: 01/18/2020 12:22 PM   Modules accepted: Orders

## 2020-02-16 ENCOUNTER — Other Ambulatory Visit: Payer: Self-pay | Admitting: Physician Assistant

## 2020-02-16 DIAGNOSIS — E034 Atrophy of thyroid (acquired): Secondary | ICD-10-CM

## 2020-02-16 NOTE — Telephone Encounter (Signed)
Requested Prescriptions  Pending Prescriptions Disp Refills  . levothyroxine (SYNTHROID) 25 MCG tablet [Pharmacy Med Name: LEVOTHYROXINE 25 MCG TABLET] 30 tablet 0    Sig: TAKE 1 TABLET BY MOUTH EVERY DAY BEFORE BREAKFAST     Endocrinology:  Hypothyroid Agents Failed - 02/16/2020 12:33 PM      Failed - TSH needs to be rechecked within 3 months after an abnormal result. Refill until TSH is due.      Failed - TSH in normal range and within 360 days    TSH  Date Value Ref Range Status  11/17/2019 12.800 (H) 0.450 - 4.500 uIU/mL Final         Failed - Valid encounter within last 12 months    Recent Outpatient Visits          3 months ago Encounter for annual physical exam   [facility] [place], Clearnce Sorrel, [place]   1 year ago Annual physical exam   [facility] Fenton Malling M, [place]   2 years ago Sore throat   [facility] Birdie Sons, [place]   2 years ago Oral lesion   [place], Clearnce Sorrel, [place]   2 years ago Annual physical exam   [facility] Fenton Malling M, [place]

## 2020-03-05 ENCOUNTER — Other Ambulatory Visit: Payer: Self-pay

## 2020-03-05 ENCOUNTER — Ambulatory Visit: Payer: BC Managed Care – PPO | Attending: Internal Medicine

## 2020-03-05 DIAGNOSIS — Z23 Encounter for immunization: Secondary | ICD-10-CM

## 2020-03-05 NOTE — Progress Notes (Signed)
   Covid-19 Vaccination Clinic  Name:  Tricia Rivera    MRN: [id number] DOB: [birth year]  03/05/2020  [name] was observed post Covid-19 immunization for 15 minutes without incident. She was provided with Vaccine Information Sheet and instruction to access the V-Safe system.   [name] was instructed to call 911 with any severe reactions post vaccine: Marland Kitchen Difficulty breathing  . Swelling of face and throat  . A fast heartbeat  . A bad rash all over body  . Dizziness and weakness   Immunizations Administered    Name Date Dose VIS Date Route   Pfizer COVID-19 Vaccine 03/05/2020 10:23 AM 0.3 mL 11/11/2019 Intramuscular   Manufacturer: [place]   Lot: [id number]   [place]: [id number]

## 2020-03-07 ENCOUNTER — Other Ambulatory Visit: Payer: Self-pay | Admitting: Physician Assistant

## 2020-03-07 DIAGNOSIS — E034 Atrophy of thyroid (acquired): Secondary | ICD-10-CM

## 2020-03-27 ENCOUNTER — Ambulatory Visit: Payer: BC Managed Care – PPO | Attending: Internal Medicine

## 2020-03-27 DIAGNOSIS — Z23 Encounter for immunization: Secondary | ICD-10-CM

## 2020-03-27 NOTE — Progress Notes (Signed)
   Covid-19 Vaccination Clinic  Name:  Tricia Rivera    MRN: [id number] DOB: 1961/08/28  03/27/2020  [name] was observed post Covid-19 immunization for 15 minutes without incident. She was provided with Vaccine Information Sheet and instruction to access the V-Safe system.   [name] was instructed to call 911 with any severe reactions post vaccine: Marland Kitchen Difficulty breathing  . Swelling of face and throat  . A fast heartbeat  . A bad rash all over body  . Dizziness and weakness   Immunizations Administered    Name Date Dose VIS Date Route   Pfizer COVID-19 Vaccine 03/27/2020  1:19 PM 0.3 mL 01/25/2019 Intramuscular   Manufacturer: [place]   Lot: [id number]   [place]: [id number]

## 2020-04-16 ENCOUNTER — Other Ambulatory Visit: Payer: Self-pay | Admitting: Physician Assistant

## 2020-04-16 DIAGNOSIS — E034 Atrophy of thyroid (acquired): Secondary | ICD-10-CM

## 2020-04-16 DIAGNOSIS — E559 Vitamin D deficiency, unspecified: Secondary | ICD-10-CM

## 2020-04-16 NOTE — Telephone Encounter (Signed)
Please see last TSH

## 2020-04-16 NOTE — Telephone Encounter (Signed)
Requested medication (s) are due for refill today: yes  Requested medication (s) are on the active medication list: yes  Last refill:  Levothyroxine: 02/16/20   Vitamin D2:11/28/19  Future visit scheduled: yes 12/21  Notes to clinic: overdue for lab work Vitamin D2 not delegated to NT to refill   Requested Prescriptions  Pending Prescriptions Disp Refills   levothyroxine (SYNTHROID) 25 MCG tablet [Pharmacy Med Name: LEVOTHYROXINE 25 MCG TABLET] 30 tablet 1    Sig: TAKE 1 TABLET BY MOUTH DAILY BEFORE BREAKFAST.      Endocrinology:  Hypothyroid Agents Failed - 04/16/2020 10:10 AM      Failed - TSH needs to be rechecked within 3 months after an abnormal result. Refill until TSH is due.      Failed - TSH in normal range and within 360 days    TSH  Date Value Ref Range Status  11/17/2019 12.800 (H) 0.450 - 4.500 uIU/mL Final          Failed - Valid encounter within last 12 months    Recent Outpatient Visits           5 months ago Encounter for annual physical exam   [facility] [place], Clearnce Sorrel, [place]   1 year ago Annual physical exam   [facility] Fenton Malling M, [place]   2 years ago Sore throat   [facility] Birdie Sons, MD   2 years ago Oral lesion   [facility] Fenton Malling M, [place]   2 years ago Annual physical exam   [place], Anderson [place] M, PA-C                Vitamin D, Ergocalciferol, (DRISDOL) 1.25 MG (50000 UNIT) CAPS capsule [Pharmacy Med Name: VITAMIN D2 1.25MG (50,000 UNIT)] 12 capsule 1    Sig: Take 1 capsule (50,000 Units total) by mouth every 7 (seven) days.      Endocrinology:  Vitamins - Vitamin D Supplementation Failed - 04/16/2020 10:10 AM      Failed - 50,000 IU strengths are not delegated      Failed - Phosphate in normal range and within 360 days    No results found for: PHOS        Failed - Vitamin D in normal range and within 360 days   Vit D, 25-Hydroxy  Date Value Ref Range Status  11/17/2019 16.6 (L) 30.0 - 100.0 ng/mL Final    Comment:    Vitamin D deficiency has been defined by the Institute of Medicine and an Endocrine Society practice guideline as a level of serum 25-OH vitamin D less than 20 ng/mL (1,2). The Endocrine Society went on to further define vitamin D insufficiency as a level between 21 and 29 ng/mL (2). 1. IOM (Institute of Medicine). 2010. Dietary reference    intakes for calcium and D. [place]: The    [employer]. 2. Holick MF, [place], Bischoff-Ferrari HA, et al.    Evaluation, treatment, and prevention of vitamin D    deficiency: an Endocrine Society clinical practice    guideline. JCEM. 2011 Jul; 96(7):1911-30.           Failed - Valid encounter within last 12 months    Recent Outpatient Visits           5 months ago Encounter for annual physical exam   [place], Clearnce Sorrel, [place]   1 year ago Annual physical exam   [facility] [place], Clearnce Sorrel, [place]   2 years ago Sore throat   [facility] Birdie Sons, [place]   2 years ago Oral lesion   [place], [place]   2 years ago Annual physical exam   [facility] Fenton Malling M, [place]              Passed - Ca in normal range and within 360 days    Calcium  Date Value Ref Range Status  11/17/2019 9.7 8.7 - 10.3 mg/dL Final

## 2020-04-17 NOTE — Telephone Encounter (Signed)
NA

## 2020-04-17 NOTE — Telephone Encounter (Signed)
Needs to come by to recheck TSH (was already ordered).  Can send 1 week supply if she is completely out of medication.  OK to send Vit D refill

## 2020-04-18 ENCOUNTER — Other Ambulatory Visit: Payer: Self-pay | Admitting: Physician Assistant

## 2020-04-18 DIAGNOSIS — E559 Vitamin D deficiency, unspecified: Secondary | ICD-10-CM

## 2020-04-18 MED ORDER — VITAMIN D (ERGOCALCIFEROL) 1.25 MG (50000 UNIT) PO CAPS: 50000.0000 [IU] | ORAL_CAPSULE | ORAL | 1 refills | Status: DC

## 2020-04-18 NOTE — Telephone Encounter (Signed)
Patient requesting Vitamin D, Ergocalciferol, (DRISDOL) 1.25 MG (50000 UT) CAPS capsule , informed patient please allow 48 to 72 hour turn around time.   [id number] T[place], [place] Phone:  [phone]  Fax:  [phone]

## 2020-04-18 NOTE — Telephone Encounter (Signed)
OK to refill per Dr. Jacinto Reap. See refill request from 04/16/2020.

## 2020-04-18 NOTE — Telephone Encounter (Signed)
I called and advised patient as below. She states she spoke with someone from our office this morning about this. Patient says she doesn't need a refill on the Thyroid medication at this time. She has enough to get by until she has labs done. She does need a refill on the Vitamin D. Refill for Vitamin D provided.

## 2020-05-23 LAB — TSH: TSH: 7.39 u[IU]/mL — ABNORMAL HIGH (ref 0.450–4.500)

## 2020-05-24 ENCOUNTER — Telehealth: Payer: Self-pay

## 2020-05-24 NOTE — Telephone Encounter (Signed)
Copied from [place] [id number]. Topic: General - Call Back - No Documentation >> May 24, 2020  2:26 PM Erick Blinks wrote: Reason for CRM: Pt would like a call back from clinic, she is requesting more lab orders since her TSH labs were lower than normal Best contact: [phone]

## 2020-05-25 MED ORDER — LEVOTHYROXINE SODIUM 50 MCG PO TABS: 50.0000 ug | ORAL_TABLET | Freq: Every day | ORAL | 1 refills | Status: DC

## 2020-05-25 NOTE — Telephone Encounter (Signed)
Please review.  Is it okay to order TSH?  Thanks,   -Mickel Baas

## 2020-05-25 NOTE — Telephone Encounter (Signed)
Pt advised.  [facility]  Apt made for 07/25/2020.  Pt would like to get her labs checked about a week before her appointment.  She would also like to have her other labs check at that time.  Lipid, Liver, kidney etc.   Thanks,   -Ahnesti Townsend

## 2020-05-25 NOTE — Telephone Encounter (Signed)
TSH remains elevated.  Need to increase synthroid dose to 50 mcg daily and f/u with PCP in 2 months for recheck.

## 2020-05-29 ENCOUNTER — Telehealth: Payer: Self-pay

## 2020-05-29 NOTE — Telephone Encounter (Signed)
No answer/ voicemail full. If patient calls back ok for [facility] nurse to give results.

## 2020-05-29 NOTE — Telephone Encounter (Signed)
-----   Message from Mar Daring, [place] sent at 05/29/2020 11:58 AM EDT ----- Thyroid is improved but still under-corrected. Would increase levothyroxine to 34mcg from 36mcg if agreeable. If dose changed would recheck TSH in 6-8 weeks.

## 2020-05-30 ENCOUNTER — Encounter: Payer: Self-pay | Admitting: Physician Assistant

## 2020-05-30 NOTE — Telephone Encounter (Signed)
Pt advised see phone message.   Thanks  -Mickel Baas

## 2020-05-30 NOTE — Telephone Encounter (Signed)
Call placed to pt's mobile number; voice mail full; unable to leave message.  Call placed to pt. Home number; phone rang multiple times; no answer and no option to leave a vm.

## 2020-06-22 ENCOUNTER — Other Ambulatory Visit: Payer: Self-pay | Admitting: Physician Assistant

## 2020-06-22 DIAGNOSIS — E034 Atrophy of thyroid (acquired): Secondary | ICD-10-CM

## 2020-07-21 ENCOUNTER — Encounter: Payer: Self-pay | Admitting: Physician Assistant

## 2020-07-21 DIAGNOSIS — Z111 Encounter for screening for respiratory tuberculosis: Secondary | ICD-10-CM

## 2020-07-21 DIAGNOSIS — E034 Atrophy of thyroid (acquired): Secondary | ICD-10-CM

## 2020-07-23 NOTE — Addendum Note (Signed)
Addended by: Doristine Devoid on: 07/23/2020 12:14 PM   Modules accepted: Orders

## 2020-07-26 ENCOUNTER — Encounter: Payer: Self-pay | Admitting: Physician Assistant

## 2020-07-26 ENCOUNTER — Telehealth: Payer: Self-pay

## 2020-07-26 NOTE — Telephone Encounter (Signed)
-----   Message from Mar Daring, [place] sent at 07/26/2020 12:07 PM EDT ----- Thyroid is normal range now. Quantiferon gold lab for TB screen is still pending.

## 2020-07-26 NOTE — Progress Notes (Signed)
Established patient visit   Patient: Tricia Rivera   DOB: 11-07-1961   59 y.o. Female  MRN: [id number] Visit Date: 07/27/2020  Today's healthcare provider: Mar Daring, PA-C   Chief Complaint  Patient presents with  . Hypothyroidism  I,Tricia Rivera,acting as a Education administrator for [employer], PA-C.,have documented all relevant documentation on the behalf of Tricia Daring, PA-C,as directed by  Tricia Daring, PA-C while in the presence of Tricia Rivera, Tricia Rivera.  Subjective    HPI  Hypothyroid, follow-up  Lab Results  Component Value Date   TSH 0.489 07/25/2020   TSH 7.390 (H) 05/22/2020   TSH 12.800 (H) 11/17/2019   FREET4 1.48 07/25/2020   Wt Readings from Last 3 Encounters:  07/27/20 131 lb 6.4 oz (59.6 kg)  11/17/19 137 lb (62.1 kg)  06/09/18 135 lb 12.8 oz (61.6 kg)    She was last seen for hypothyroid 1 years ago.  Management since that visit includes no changes. She reports good compliance with treatment. She is not having side effects.   Symptoms: No change in energy level No constipation  No diarrhea No heat / cold intolerance  No nervousness No palpitations  No weight changes    -----------------------------------------------------------------------------------------  Patient Active Problem List   Diagnosis Date Noted  . Hypothyroidism due to acquired atrophy of thyroid 11/18/2019  . ASCUS favoring benign 01/18/2016  . ADD (attention deficit disorder) 01/18/2016  . Anxiety 01/18/2016  . Clinical depression 01/18/2016  . Elevated blood sugar 01/18/2016  . Hypercholesteremia 01/18/2016  . Psoriasis 01/18/2016  . Avitaminosis D 01/18/2016   Past Medical History:  Diagnosis Date  . ADD (attention deficit disorder)   . Anxiety   . Depression   . Hyperlipidemia   . Psoriasis   . Vitamin D deficiency        Medications: Outpatient Medications Prior to Visit  Medication Sig  . levothyroxine (SYNTHROID) 50 MCG  tablet Take 1 tablet (50 mcg total) by mouth daily.  Marland Kitchen triamcinolone cream (KENALOG) 0.1 % APPLY TWICE A DAY TO AFFECTED AREAS  . Vitamin D, Ergocalciferol, (DRISDOL) 1.25 MG (50000 UNIT) CAPS capsule Take 1 capsule (50,000 Units total) by mouth every 7 (seven) days.   No facility-administered medications prior to visit.    Review of Systems  Constitutional: Negative.   Respiratory: Negative.   Cardiovascular: Negative.   Endocrine: Negative.   Hematological: Negative.     Last CBC Lab Results  Component Value Date   WBC 3.6 11/17/2019   HGB 14.5 11/17/2019   HCT 41.4 11/17/2019   MCV 96 11/17/2019   MCH 33.5 (H) 11/17/2019   RDW 12.3 11/17/2019   PLT 296 92/33/0076   Last metabolic panel Lab Results  Component Value Date   GLUCOSE 93 11/17/2019   NA 139 11/17/2019   K 4.5 11/17/2019   CL 100 11/17/2019   CO2 22 11/17/2019   BUN 11 11/17/2019   CREATININE 0.80 11/17/2019   GFRNONAA 80 11/17/2019   GFRAA 93 11/17/2019   CALCIUM 9.7 11/17/2019   PROT 7.1 11/17/2019   ALBUMIN 4.6 11/17/2019   LABGLOB 2.5 11/17/2019   AGRATIO 1.8 11/17/2019   BILITOT 0.4 11/17/2019   ALKPHOS 68 11/17/2019   AST 68 (H) 11/17/2019   ALT 95 (H) 11/17/2019      Objective    BP (!) 142/82 (BP Location: Left Arm, Patient Position: Sitting, Cuff Size: Normal)   Pulse 82   Temp 98.5 F (  36.9 C) (Oral)   Ht 5\' 4"  (1.626 m)   Wt 131 lb 6.4 oz (59.6 kg)   SpO2 97%   BMI 22.55 kg/m  BP Readings from Last 3 Encounters:  07/27/20 (!) 142/82  11/17/19 130/90  06/09/18 106/74   Wt Readings from Last 3 Encounters:  07/27/20 131 lb 6.4 oz (59.6 kg)  11/17/19 137 lb (62.1 kg)  06/09/18 135 lb 12.8 oz (61.6 kg)      Physical Exam Constitutional:      Appearance: Normal appearance. She is normal weight.  Neck:     Thyroid: No thyroid mass or thyromegaly.  Musculoskeletal:     Cervical back: No tenderness.  Skin:    General: Skin is warm and dry.  Neurological:     General: No  focal deficit present.     Mental Status: She is alert and oriented to person, place, and time.  Psychiatric:        Mood and Affect: Mood normal.        Behavior: Behavior normal.      No results found for any visits on 07/27/20.  Assessment & Plan     1. Hypothyroidism due to acquired atrophy of thyroid Labs done this week show thyroid stable, reviewed today in office. Continue Levothyroxine 73mcg. F/U when CPE due.   2. 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 Quantiferon gold TB screen for employment is negative.   No follow-ups on file.      Tricia Bowl, PA-C, have reviewed all documentation for this visit. The documentation on 07/31/20 for the exam, diagnosis, procedures, and orders are all accurate and complete.   Tricia Rivera  [facility] [phone] (phone) [phone] (fax)  Tricia Rivera

## 2020-07-26 NOTE — Telephone Encounter (Signed)
Written by Mar Daring, PA-C on 07/26/2020 12:07 PM EDT Seen by patient PAM Claiborne Billings on 07/26/2020 12:53 PM

## 2020-07-26 NOTE — Telephone Encounter (Signed)
Resulted in result note

## 2020-07-27 ENCOUNTER — Other Ambulatory Visit: Payer: Self-pay

## 2020-07-27 ENCOUNTER — Encounter: Payer: Self-pay | Admitting: Physician Assistant

## 2020-07-27 ENCOUNTER — Ambulatory Visit: Payer: BC Managed Care – PPO | Admitting: Physician Assistant

## 2020-07-27 VITALS — BP 142/82 | HR 82 | Temp 98.5°F | Ht 64.0 in | Wt 131.4 lb

## 2020-07-27 DIAGNOSIS — Z021 Encounter for pre-employment examination: Secondary | ICD-10-CM | POA: Diagnosis not present

## 2020-07-27 DIAGNOSIS — E034 Atrophy of thyroid (acquired): Secondary | ICD-10-CM | POA: Diagnosis not present

## 2020-07-28 LAB — QUANTIFERON-TB GOLD PLUS
QuantiFERON Mitogen Value: >10 IU/mL
QuantiFERON Nil Value: 0.01 IU/mL
QuantiFERON TB1 Ag Value: 0.04 IU/mL
QuantiFERON TB2 Ag Value: 0.03 IU/mL
QuantiFERON-TB Gold Plus: NEGATIVE

## 2020-07-28 LAB — TSH+FREE T4
Free T4: 1.48 ng/dL (ref 0.82–1.77)
TSH: 0.489 u[IU]/mL (ref 0.450–4.500)

## 2020-07-30 ENCOUNTER — Telehealth: Payer: Self-pay

## 2020-07-30 NOTE — Telephone Encounter (Signed)
Mar Daring, PA-C  07/26/2020 12:07 PM EDT     Thyroid is normal range now.

## 2020-07-30 NOTE — Telephone Encounter (Signed)
-----   Message from Mar Daring, [place] sent at 07/30/2020 11:00 AM EDT ----- Quantiferon gold testing for TB screen is negative.

## 2020-07-30 NOTE — Telephone Encounter (Signed)
Seen by patient Tricia Rivera on 07/30/2020 11:01 AM

## 2020-07-30 NOTE — Telephone Encounter (Signed)
Tried calling patient, no answer. OK for PEC to give results.

## 2020-07-31 ENCOUNTER — Encounter: Payer: Self-pay | Admitting: Physician Assistant

## 2020-08-27 ENCOUNTER — Encounter: Payer: Self-pay | Admitting: Physician Assistant

## 2020-08-28 NOTE — Telephone Encounter (Signed)
Can someone reach out to schedule patient on covid schedule after 09/26/20? Last Pfizer was on 03/27/20. 6 months would be after 09/26/20.  Thanks.  JB

## 2020-09-22 ENCOUNTER — Other Ambulatory Visit: Payer: Self-pay | Admitting: Family Medicine

## 2020-09-22 ENCOUNTER — Other Ambulatory Visit: Payer: Self-pay | Admitting: Physician Assistant

## 2020-09-22 DIAGNOSIS — E559 Vitamin D deficiency, unspecified: Secondary | ICD-10-CM

## 2020-09-22 DIAGNOSIS — E034 Atrophy of thyroid (acquired): Secondary | ICD-10-CM

## 2020-09-22 NOTE — Telephone Encounter (Signed)
Requested medication (s) are due for refill today: yes  Requested medication (s) are on the active medication list: yes  Last refill:  04/18/20  Future visit scheduled: yes  Notes to clinic:  med not delegated to NT to RF   Requested Prescriptions  Pending Prescriptions Disp Refills   Vitamin D, Ergocalciferol, (DRISDOL) 1.25 MG (50000 UNIT) CAPS capsule [Pharmacy Med Name: VITAMIN D2 1.25MG (50,000 UNIT)] 12 capsule 1    Sig: Take 1 capsule (50,000 Units total) by mouth every 7 (seven) days.      Endocrinology:  Vitamins - Vitamin D Supplementation Failed - 09/22/2020  9:46 AM      Failed - 50,000 IU strengths are not delegated      Failed - Phosphate in normal range and within 360 days    No results found for: PHOS        Failed - Vitamin D in normal range and within 360 days    Vit D, 25-Hydroxy  Date Value Ref Range Status  11/17/2019 16.6 (L) 30.0 - 100.0 ng/mL Final    Comment:    Vitamin D deficiency has been defined by the Institute of Medicine and an Endocrine Society practice guideline as a level of serum 25-OH vitamin D less than 20 ng/mL (1,2). The Endocrine Society went on to further define vitamin D insufficiency as a level between 21 and 29 ng/mL (2). 1. IOM (Institute of Medicine). 2010. Dietary reference    intakes for calcium and D. [place]: The    [employer]. 2. Holick MF, Binkley [place], Bischoff-Ferrari HA, et al.    Evaluation, treatment, and prevention of vitamin D    deficiency: an Endocrine Society clinical practice    guideline. JCEM. 2011 Jul; 96(7):1911-30.           Passed - Ca in normal range and within 360 days    Calcium  Date Value Ref Range Status  11/17/2019 9.7 8.7 - 10.3 mg/dL Final          Passed - Valid encounter within last 12 months    Recent Outpatient Visits           1 month ago Hypothyroidism due to acquired atrophy of thyroid   [facility] [place], Clearnce Sorrel, [place]   10 months ago  Encounter for annual physical exam   [place], Clearnce Sorrel, [place]   2 years ago Annual physical exam   [facility] Mar Daring, [place]   2 years ago Sore throat   [facility] Birdie Sons, [place]   2 years ago Oral lesion   [place], [place]       Future Appointments             In 1 month [place], Clearnce Sorrel, PA-C [employer], [place]

## 2020-11-16 NOTE — Progress Notes (Signed)
Complete physical exam   Patient: Tricia Rivera   DOB: 09-01-1961   59 y.o. Female  MRN: [id number] Visit Date: 11/19/2020  Today's healthcare provider: Mar Daring, PA-C   Chief Complaint  Patient presents with  . Annual Exam   Subjective    Tricia Rivera is a 59 y.o. female who presents today for a complete physical exam.  She reports consuming a general diet. Home exercise routine includes walks 2 miles daily. She generally feels well. She reports sleeping fairly well. She does not have additional problems to discuss today.  HPI  01/06/20-Normal mammogram. Repeat screening in one year. 06/10/17-Pap is normal, hpv negative. Will repeat in 3-5 years Influenza vaccine done at [place]  Past Medical History:  Diagnosis Date  . ADD (attention deficit disorder)   . Anxiety   . Depression   . Hyperlipidemia   . Psoriasis   . Vitamin D deficiency    Past Surgical History:  Procedure Laterality Date  . BUNIONECTOMY  2010   Social History   Socioeconomic History  . Marital status: Married    Spouse name: Not on file  . Number of children: Not on file  . Years of education: Not on file  . Highest education level: Not on file  Occupational History  . Not on file  Tobacco Use  . Smoking status: Never Smoker  . Smokeless tobacco: Never Used  Vaping Use  . Vaping Use: Never used  Substance and Sexual Activity  . Alcohol use: Yes    Alcohol/week: 3.0 standard drinks    Types: 3 Glasses of wine per week  . Drug use: No  . Sexual activity: Not on file  Other Topics Concern  . Not on file  Social History Narrative  . Not on file   Social Determinants of Health   Financial Resource Strain: Not on file  Food Insecurity: Not on file  Transportation Needs: Not on file  Physical Activity: Not on file  Stress: Not on file  Social Connections: Not on file  Intimate Partner Violence: Not on file   Family Status  Relation Name Status  . MGF   Deceased       died of liver cancer.  . Mother  Deceased at age 02/20/15       died from pancreatic cancer  . Father  Deceased at age 79       Liver Cancer  . Sister  Alive  . PGF  (Not Specified)  . Mat Aunt  (Not Specified)  . Sister  Alive   Family History  Problem Relation Age of Onset  . Cancer Maternal Grandfather   . Cancer Paternal Grandfather        History of liver cancer  . Breast cancer Maternal Aunt    No Known Allergies  Patient Care Team: Mar Daring, PA-C as PCP - General (Family Medicine)   Medications: Outpatient Medications Prior to Visit  Medication Sig  . triamcinolone cream (KENALOG) 0.1 % APPLY TWICE A DAY TO AFFECTED AREAS  . [DISCONTINUED] levothyroxine (SYNTHROID) 50 MCG tablet Take 1 tablet (50 mcg total) by mouth daily.  . [DISCONTINUED] Vitamin D, Ergocalciferol, (DRISDOL) 1.25 MG (50000 UNIT) CAPS capsule TAKE 1 CAPSULE (50,000 UNITS TOTAL) BY MOUTH EVERY 7 (SEVEN) DAYS.   No facility-administered medications prior to visit.    Review of Systems  Constitutional: Negative.   HENT: Negative.   Eyes: Negative.   Respiratory: Negative.  Cardiovascular: Negative.   Gastrointestinal: Negative.        "bleeding hemorrhoids"   Endocrine: Negative.   Genitourinary: Negative.   Musculoskeletal: Negative.        Psoriasis  Skin: Negative.   Allergic/Immunologic: Negative.   Neurological: Negative.   Hematological: Negative.   Psychiatric/Behavioral: Negative.     Last CBC Lab Results  Component Value Date   WBC 3.6 11/17/2019   HGB 14.5 11/17/2019   HCT 41.4 11/17/2019   MCV 96 11/17/2019   MCH 33.5 (H) 11/17/2019   RDW 12.3 11/17/2019   PLT 296 96/22/2979   Last metabolic panel Lab Results  Component Value Date   GLUCOSE 93 11/17/2019   NA 139 11/17/2019   K 4.5 11/17/2019   CL 100 11/17/2019   CO2 22 11/17/2019   BUN 11 11/17/2019   CREATININE 0.80 11/17/2019   GFRNONAA 80 11/17/2019   GFRAA 93 11/17/2019    CALCIUM 9.7 11/17/2019   PROT 7.1 11/17/2019   ALBUMIN 4.6 11/17/2019   LABGLOB 2.5 11/17/2019   AGRATIO 1.8 11/17/2019   BILITOT 0.4 11/17/2019   ALKPHOS 68 11/17/2019   AST 68 (H) 11/17/2019   ALT 95 (H) 11/17/2019      Objective    BP 121/64 (BP Location: Right Arm, Patient Position: Sitting, Cuff Size: Large)   Pulse 88   Temp 98.5 F (36.9 C) (Oral)   Resp 16   Ht 5\' 4"  (1.626 m)   Wt 131 lb 11.2 oz (59.7 kg)   BMI 22.61 kg/m  BP Readings from Last 3 Encounters:  11/19/20 121/64  07/27/20 (!) 142/82  11/17/19 130/90   Wt Readings from Last 3 Encounters:  11/19/20 131 lb 11.2 oz (59.7 kg)  07/27/20 131 lb 6.4 oz (59.6 kg)  11/17/19 137 lb (62.1 kg)      Physical Exam Vitals reviewed.  Constitutional:      General: She is not in acute distress.    Appearance: Normal appearance. She is well-developed, normal weight and well-nourished. She is not ill-appearing or diaphoretic.  HENT:     Head: Normocephalic and atraumatic.     Right Ear: Tympanic membrane, ear canal and external ear normal.     Left Ear: Tympanic membrane, ear canal and external ear normal.     Nose: Nose normal.     Mouth/Throat:     Mouth: Oropharynx is clear and moist. Mucous membranes are moist.     Pharynx: Oropharynx is clear. No oropharyngeal exudate.  Eyes:     General: No scleral icterus.       Right eye: No discharge.        Left eye: No discharge.     Extraocular Movements: Extraocular movements intact and EOM normal.     Conjunctiva/sclera: Conjunctivae normal.     Pupils: Pupils are equal, round, and reactive to light.  Neck:     Thyroid: No thyromegaly.     Vascular: No carotid bruit or JVD.     Trachea: No tracheal deviation.  Cardiovascular:     Rate and Rhythm: Normal rate and regular rhythm.     Pulses: Normal pulses and intact distal pulses.     Heart sounds: Normal heart sounds. No murmur heard. No friction rub. No gallop.   Pulmonary:     Effort: Pulmonary effort is  normal. No respiratory distress.     Breath sounds: Normal breath sounds. No wheezing or rales.  Chest:     Chest wall: No tenderness.  Breasts:     Right: Normal. No mass, skin change, tenderness, axillary adenopathy or supraclavicular adenopathy.     Left: Normal. No mass, skin change, tenderness, axillary adenopathy or supraclavicular adenopathy.    Abdominal:     General: Abdomen is flat. Bowel sounds are normal. There is no distension.     Palpations: Abdomen is soft. There is no mass.     Tenderness: There is no abdominal tenderness. There is no guarding or rebound.  Musculoskeletal:        General: No tenderness or edema. Normal range of motion.     Cervical back: Normal range of motion and neck supple. No tenderness.     Right lower leg: No edema.     Left lower leg: No edema.  Lymphadenopathy:     Cervical: No cervical adenopathy.     Upper Body:     Right upper body: No supraclavicular, axillary or pectoral adenopathy.     Left upper body: No supraclavicular, axillary or pectoral adenopathy.  Skin:    General: Skin is warm and dry.     Capillary Refill: Capillary refill takes less than 2 seconds.     Findings: No rash.  Neurological:     General: No focal deficit present.     Mental Status: She is alert and oriented to person, place, and time. Mental status is at baseline.  Psychiatric:        Mood and Affect: Mood and affect and mood normal.        Behavior: Behavior normal.        Thought Content: Thought content normal.        Judgment: Judgment normal.       Last depression screening scores PHQ 2/9 Scores 11/19/2020 06/09/2018 06/08/2017  PHQ - 2 Score 0 0 0  PHQ- 9 Score - 1 0   Last fall risk screening Fall Risk  11/19/2020  Falls in the past year? 0  Number falls in past yr: 0  Injury with Fall? 0  Risk for fall due to : No Fall Risks  Follow up Falls evaluation completed   Last Audit-C alcohol use screening Alcohol Use Disorder Test (AUDIT)  11/19/2020  1. How often do you have a drink containing alcohol? 0  2. How many drinks containing alcohol do you have on a typical day when you are drinking? -  3. How often do you have six or more drinks on one occasion? 0  AUDIT-C Score -  Alcohol Brief Interventions/Follow-up AUDIT Score <7 follow-up not indicated   A score of 3 or more in women, and 4 or more in men indicates increased risk for alcohol abuse, EXCEPT if all of the points are from question 1   No results found for any visits on 11/19/20.  Assessment & Plan    Routine Health Maintenance and Physical Exam  Exercise Activities and Dietary recommendations Goals   None     Immunization History  Administered Date(s) Administered  . Influenza,inj,Quad PF,6+ Mos 09/13/2019  . Moderna Sars-Covid-2 Vaccination 10/01/2020  . PFIZER SARS-COV-2 Vaccination 03/05/2020, 03/27/2020  . Td 02/03/2011, 06/08/2017  . Tdap 08/10/2007, 01/31/2011    Health Maintenance  Topic Date Due  . INFLUENZA VACCINE  02/28/2021 (Originally 07/01/2020)  . MAMMOGRAM  01/05/2022  . PAP SMEAR-Modifier  06/08/2022  . COLONOSCOPY  01/30/2026  . TETANUS/TDAP  06/09/2027  . COVID-19 Vaccine  Completed  . Hepatitis C Screening  Completed  . HIV Screening  Completed  Discussed health benefits of physical activity, and encouraged her to engage in regular exercise appropriate for her age and condition.  1. Annual physical exam Normal physical exam today. Will check labs as below and f/u pending lab results. If labs are stable and WNL she will not need to have these rechecked for one year at her next annual physical exam. She is to call the office in the meantime if she has any acute issue, questions or concerns. - CBC with Differential/Platelet - Hemoglobin A1c - TSH - Lipid panel - Comprehensive metabolic panel  2. Encounter for breast cancer screening using non-mammogram modality Breast exam today was normal. There is no family history of  breast cancer. She does perform regular self breast exams. Mammogram was ordered as below. Information for [facility] was given to patient so she may schedule her mammogram at her convenience. - MM 3D SCREEN BREAST BILATERAL; Future  3. Hypothyroidism due to acquired atrophy of thyroid Stable. Continue Levothyroxine 5mcg. Will check labs as below and f/u pending results. - CBC with Differential/Platelet - TSH  4. Avitaminosis D H/O this. On high dose supplement. Postmenopausal. Will check labs as below and f/u pending results. - CBC with Differential/Platelet - Vitamin D (25 hydroxy)  5. Elevated blood sugar Diet controlled. Will check labs as below and f/u pending results. - CBC with Differential/Platelet - Hemoglobin A1c - Lipid panel - Comprehensive metabolic panel  6. Hypercholesteremia Diet controlled. Will check labs as below and f/u pending results. - CBC with Differential/Platelet - Hemoglobin A1c - Lipid panel - Comprehensive metabolic panel   Return in about 1 year (around 11/19/2021), or if symptoms worsen or fail to improve, for CPE.     Reynolds Bowl, PA-C, have reviewed all documentation for this visit. The documentation on 11/19/20 for the exam, diagnosis, procedures, and orders are all accurate and complete.   Rubye Beach  [facility] [phone] (phone) [phone] (fax)  [place]

## 2020-11-19 ENCOUNTER — Ambulatory Visit (INDEPENDENT_AMBULATORY_CARE_PROVIDER_SITE_OTHER): Payer: BC Managed Care – PPO | Admitting: Physician Assistant

## 2020-11-19 ENCOUNTER — Encounter: Payer: Self-pay | Admitting: Physician Assistant

## 2020-11-19 ENCOUNTER — Other Ambulatory Visit: Payer: Self-pay

## 2020-11-19 VITALS — BP 121/64 | HR 88 | Temp 98.5°F | Resp 16 | Ht 64.0 in | Wt 131.7 lb

## 2020-11-19 DIAGNOSIS — E7800 Pure hypercholesterolemia, unspecified: Secondary | ICD-10-CM

## 2020-11-19 DIAGNOSIS — Z1239 Encounter for other screening for malignant neoplasm of breast: Secondary | ICD-10-CM

## 2020-11-19 DIAGNOSIS — E559 Vitamin D deficiency, unspecified: Secondary | ICD-10-CM | POA: Diagnosis not present

## 2020-11-19 DIAGNOSIS — Z0000 Encounter for general adult medical examination without abnormal findings: Secondary | ICD-10-CM | POA: Diagnosis not present

## 2020-11-19 DIAGNOSIS — E034 Atrophy of thyroid (acquired): Secondary | ICD-10-CM | POA: Diagnosis not present

## 2020-11-19 DIAGNOSIS — R739 Hyperglycemia, unspecified: Secondary | ICD-10-CM

## 2020-11-19 MED ORDER — VITAMIN D (ERGOCALCIFEROL) 1.25 MG (50000 UNIT) PO CAPS: 50000.0000 [IU] | ORAL_CAPSULE | ORAL | 1 refills | Status: DC

## 2020-11-19 MED ORDER — LEVOTHYROXINE SODIUM 50 MCG PO TABS: 50.0000 ug | ORAL_TABLET | Freq: Every day | ORAL | 1 refills | Status: DC

## 2020-11-19 NOTE — Patient Instructions (Signed)
[facility] [address] [place],  [place]  [id number] Main: [phone]   Preventive Care 61-61 Years Old, Female Preventive care refers to visits with your health care provider and lifestyle choices that can promote health and wellness. This includes:  A yearly physical exam. This may also be called an annual well check.  Regular dental visits and eye exams.  Immunizations.  Screening for certain conditions.  Healthy lifestyle choices, such as eating a healthy diet, getting regular exercise, not using drugs or products that contain nicotine and tobacco, and limiting alcohol use. What can I expect for my preventive care visit? Physical exam Your health care provider will check your:  Height and weight. This may be used to calculate body mass index (BMI), which tells if you are at a healthy weight.  Heart rate and blood pressure.  Skin for abnormal spots. Counseling Your health care provider may ask you questions about your:  Alcohol, tobacco, and drug use.  Emotional well-being.  Home and relationship well-being.  Sexual activity.  Eating habits.  Work and work environment.  Method of birth control.  Menstrual cycle.  Pregnancy history. What immunizations do I need?  Influenza (flu) vaccine  This is recommended every year. Tetanus, diphtheria, and pertussis (Tdap) vaccine  You may need a Td booster every 10 years. Varicella (chickenpox) vaccine  You may need this if you have not been vaccinated. Zoster (shingles) vaccine  You may need this after age 60. Measles, mumps, and rubella (MMR) vaccine  You may need at least one dose of MMR if you were born in 1957 or later. You may also need a second dose. Pneumococcal conjugate (PCV13) vaccine  You may need this if you have certain conditions and were not previously vaccinated. Pneumococcal polysaccharide (PPSV23) vaccine  You may need one or two doses if you smoke  cigarettes or if you have certain conditions. Meningococcal conjugate (MenACWY) vaccine  You may need this if you have certain conditions. Hepatitis A vaccine  You may need this if you have certain conditions or if you travel or work in places where you may be exposed to hepatitis A. Hepatitis B vaccine  You may need this if you have certain conditions or if you travel or work in places where you may be exposed to hepatitis B. Haemophilus influenzae type b (Hib) vaccine  You may need this if you have certain conditions. Human papillomavirus (HPV) vaccine  If recommended by your health care provider, you may need three doses over 6 months. You may receive vaccines as individual doses or as more than one vaccine together in one shot (combination vaccines). Talk with your health care provider about the risks and benefits of combination vaccines. What tests do I need? Blood tests  Lipid and cholesterol levels. These may be checked every 5 years, or more frequently if you are over 50 years old.  Hepatitis C test.  Hepatitis B test. Screening  Lung cancer screening. You may have this screening every year starting at age 55 if you have a 30-pack-year history of smoking and currently smoke or have quit within the past 15 years.  Colorectal cancer screening. All adults should have this screening starting at age 50 and continuing until age 75. Your health care provider may recommend screening at age 45 if you are at increased risk. You will have tests every 1-10 years, depending on your results and the type of screening test.  Diabetes screening. This is   done by checking your blood sugar (glucose) after you have not eaten for a while (fasting). You may have this done every 1-3 years.  Mammogram. This may be done every 1-2 years. Talk with your health care provider about when you should start having regular mammograms. This may depend on whether you have a family history of breast  cancer.  BRCA-related cancer screening. This may be done if you have a family history of breast, ovarian, tubal, or peritoneal cancers.  Pelvic exam and Pap test. This may be done every 3 years starting at age 36. Starting at age 66, this may be done every 5 years if you have a Pap test in combination with an HPV test. Other tests  Sexually transmitted disease (STD) testing.  Bone density scan. This is done to screen for osteoporosis. You may have this scan if you are at high risk for osteoporosis. Follow these instructions at home: Eating and drinking  Eat a diet that includes fresh fruits and vegetables, whole grains, lean protein, and low-fat dairy.  Take vitamin and mineral supplements as recommended by your health care provider.  Do not drink alcohol if: ? Your health care provider tells you not to drink. ? You are pregnant, may be pregnant, or are planning to become pregnant.  If you drink alcohol: ? Limit how much you have to 0-1 drink a day. ? Be aware of how much alcohol is in your drink. In the [place] one drink equals one 12 oz bottle of beer (355 mL), one 5 oz glass of wine (148 mL), or one 1 oz glass of hard liquor (44 mL). Lifestyle  Take daily care of your teeth and gums.  Stay active. Exercise for at least 30 minutes on 5 or more days each week.  Do not use any products that contain nicotine or tobacco, such as cigarettes, e-cigarettes, and chewing tobacco. If you need help quitting, ask your health care provider.  If you are sexually active, practice safe sex. Use a condom or other form of birth control (contraception) in order to prevent pregnancy and STIs (sexually transmitted infections).  If told by your health care provider, take low-dose aspirin daily starting at age 55. What's next?  Visit your health care provider once a year for a well check visit.  Ask your health care provider how often you should have your eyes and teeth checked.  Stay up to date  on all vaccines. This information is not intended to replace advice given to you by your health care provider. Make sure you discuss any questions you have with your health care provider. Document Revised: 07/29/2018 Document Reviewed: 07/29/2018 Elsevier Patient Education  2020 [employer].

## 2020-11-20 LAB — CBC WITH DIFFERENTIAL/PLATELET
Basophils Absolute: 0.1 10*3/uL (ref 0.0–0.2)
Basos: 1 %
EOS (ABSOLUTE): 0.1 10*3/uL (ref 0.0–0.4)
Eos: 3 %
Hematocrit: 39.2 % (ref 34.0–46.6)
Hemoglobin: 14.4 g/dL (ref 11.1–15.9)
Immature Grans (Abs): 0 10*3/uL (ref 0.0–0.1)
Immature Granulocytes: 0 %
Lymphocytes Absolute: 1.4 10*3/uL (ref 0.7–3.1)
Lymphs: 30 %
MCH: 35.7 pg — ABNORMAL HIGH (ref 26.6–33.0)
MCHC: 36.7 g/dL — ABNORMAL HIGH (ref 31.5–35.7)
MCV: 97 fL (ref 79–97)
Monocytes Absolute: 0.5 10*3/uL (ref 0.1–0.9)
Monocytes: 9 %
Neutrophils Absolute: 2.7 10*3/uL (ref 1.4–7.0)
Neutrophils: 57 %
Platelets: 265 10*3/uL (ref 150–450)
RBC: 4.03 x10E6/uL (ref 3.77–5.28)
RDW: 13 % (ref 11.7–15.4)
WBC: 4.8 10*3/uL (ref 3.4–10.8)

## 2020-11-20 LAB — COMPREHENSIVE METABOLIC PANEL
ALT: 60 IU/L — ABNORMAL HIGH (ref 0–32)
AST: 51 IU/L — ABNORMAL HIGH (ref 0–40)
Albumin/Globulin Ratio: 1.8 (ref 1.2–2.2)
Albumin: 4.6 g/dL (ref 3.8–4.8)
Alkaline Phosphatase: 77 IU/L (ref 44–121)
BUN/Creatinine Ratio: 14 (ref 12–28)
BUN: 11 mg/dL (ref 8–27)
Bilirubin Total: 0.4 mg/dL (ref 0.0–1.2)
CO2: 20 mmol/L (ref 20–29)
Calcium: 9.8 mg/dL (ref 8.7–10.3)
Chloride: 102 mmol/L (ref 96–106)
Creatinine, Ser: 0.77 mg/dL (ref 0.57–1.00)
GFR calc Af Amer: 96 mL/min/{1.73_m2} (ref >59)
GFR calc non Af Amer: 84 mL/min/{1.73_m2} (ref >59)
Globulin, Total: 2.6 g/dL (ref 1.5–4.5)
Glucose: 90 mg/dL (ref 65–99)
Potassium: 4.2 mmol/L (ref 3.5–5.2)
Sodium: 140 mmol/L (ref 134–144)
Total Protein: 7.2 g/dL (ref 6.0–8.5)

## 2020-11-20 LAB — LIPID PANEL
Chol/HDL Ratio: 2.6 ratio (ref 0.0–4.4)
Cholesterol, Total: 265 mg/dL — ABNORMAL HIGH (ref 100–199)
HDL: 103 mg/dL (ref >39)
LDL Chol Calc (NIH): 143 mg/dL — ABNORMAL HIGH (ref 0–99)
Triglycerides: 111 mg/dL (ref 0–149)
VLDL Cholesterol Cal: 19 mg/dL (ref 5–40)

## 2020-11-20 LAB — TSH: TSH: 3.97 u[IU]/mL (ref 0.450–4.500)

## 2020-11-20 LAB — VITAMIN D 25 HYDROXY (VIT D DEFICIENCY, FRACTURES): Vit D, 25-Hydroxy: 54.8 ng/mL (ref 30.0–100.0)

## 2020-11-20 LAB — HEMOGLOBIN A1C
Est. average glucose Bld gHb Est-mCnc: 114 mg/dL
Hgb A1c MFr Bld: 5.6 % (ref 4.8–5.6)

## 2021-06-28 ENCOUNTER — Other Ambulatory Visit: Payer: Self-pay | Admitting: Physician Assistant

## 2021-07-12 ENCOUNTER — Other Ambulatory Visit: Payer: Self-pay | Admitting: Physician Assistant

## 2021-07-12 DIAGNOSIS — E559 Vitamin D deficiency, unspecified: Secondary | ICD-10-CM

## 2021-07-15 NOTE — Telephone Encounter (Signed)
Need to recheck and reassess her before refilling. Can use OTC 2000 units daily of Vit D3 for now

## 2021-11-21 ENCOUNTER — Other Ambulatory Visit: Payer: Self-pay

## 2021-11-21 ENCOUNTER — Encounter: Payer: Self-pay | Admitting: Physician Assistant

## 2021-11-21 ENCOUNTER — Ambulatory Visit (INDEPENDENT_AMBULATORY_CARE_PROVIDER_SITE_OTHER): Payer: BC Managed Care – PPO | Admitting: Physician Assistant

## 2021-11-21 VITALS — BP 138/86 | HR 87 | Temp 98.5°F | Ht 64.0 in | Wt 132.7 lb

## 2021-11-21 DIAGNOSIS — Z0000 Encounter for general adult medical examination without abnormal findings: Secondary | ICD-10-CM

## 2021-11-21 DIAGNOSIS — Z1231 Encounter for screening mammogram for malignant neoplasm of breast: Secondary | ICD-10-CM

## 2021-11-21 DIAGNOSIS — E785 Hyperlipidemia, unspecified: Secondary | ICD-10-CM | POA: Diagnosis not present

## 2021-11-21 DIAGNOSIS — E559 Vitamin D deficiency, unspecified: Secondary | ICD-10-CM | POA: Diagnosis not present

## 2021-11-21 DIAGNOSIS — E039 Hypothyroidism, unspecified: Secondary | ICD-10-CM | POA: Diagnosis not present

## 2021-11-21 DIAGNOSIS — L409 Psoriasis, unspecified: Secondary | ICD-10-CM

## 2021-11-21 DIAGNOSIS — Z1211 Encounter for screening for malignant neoplasm of colon: Secondary | ICD-10-CM

## 2021-11-21 MED ORDER — TRIAMCINOLONE ACETONIDE 0.1 % EX CREA: TOPICAL_CREAM | CUTANEOUS | 6 refills | Status: DC

## 2021-11-21 MED ORDER — VITAMIN D (ERGOCALCIFEROL) 1.25 MG (50000 UNIT) PO CAPS: 50000.0000 [IU] | ORAL_CAPSULE | ORAL | 1 refills | Status: DC

## 2021-11-21 NOTE — Progress Notes (Signed)
Complete physical exam   Patient: Tricia Rivera   DOB: 12/24/1960   60 y.o. Female  MRN: [id number] Visit Date: 11/21/2021  Today's healthcare provider: [name], PA-C   Cc. CPE  Subjective    Tricia Rivera is a 60 y.o. female who presents today for a complete physical exam.  She reports consuming a general diet.  The patient reports walking every day when the weather is nice and sometimes decent for at least 2 miles (40 minutes).  She generally feels well. She reports sleeping well. She does have additional problems to discuss today.  She reports suffering from chronic psoriasis, initially treated with shots of steroids every so often which would completely resolve her symptoms but when it wore off would bring her symptoms back worse than they ever were.  She is not seen the dermatologist for several years.  Currently just managing with over-the-counter therapy.  She has psoriasis on her extremities upper body and buttocks.  Denies any open wounds.   Past Medical History:  Diagnosis Date   ADD (attention deficit disorder)    Anxiety    Depression    Hyperlipidemia    Psoriasis    Vitamin D deficiency    Past Surgical History:  Procedure Laterality Date   BUNIONECTOMY  2010   Social History   Socioeconomic History   Marital status: Married    Spouse name: Not on file   Number of children: Not on file   Years of education: Not on file   Highest education level: Not on file  Occupational History   Not on file  Tobacco Use   Smoking status: Never   Smokeless tobacco: Never  Vaping Use   Vaping Use: Never used  Substance and Sexual Activity   Alcohol use: Yes    Alcohol/week: 3.0 standard drinks    Types: 3 Glasses of wine per week   Drug use: No   Sexual activity: Not on file  Other Topics Concern   Not on file  Social History Narrative   Not on file   Social Determinants of Health   Financial Resource Strain: Not on file  Food Insecurity: Not  on file  Transportation Needs: Not on file  Physical Activity: Not on file  Stress: Not on file  Social Connections: Not on file  Intimate Partner Violence: Not on file   Family Status  Relation Name Status   MGF  Deceased       died of liver cancer.   Mother  Deceased at age 06-Feb-2015       died from pancreatic cancer   Father  Deceased at age 4       Liver Cancer   Sister  Alive   PGF  (Not Specified)   Programmer, systems  (Not Specified)   Sister  Alive   Family History  Problem Relation Age of Onset   Cancer Maternal Grandfather    Cancer Paternal Grandfather        History of liver cancer   Breast cancer Maternal Aunt    No Known Allergies  Patient Care Team: [name], PA-C as PCP - General (Physician Assistant)   Medications: Outpatient Medications Prior to Visit  Medication Sig   levothyroxine (SYNTHROID) 50 MCG tablet TAKE 1 TABLET BY MOUTH EVERY DAY   [DISCONTINUED] triamcinolone cream (KENALOG) 0.1 % APPLY TWICE A DAY TO AFFECTED AREAS   [DISCONTINUED] Vitamin D, Ergocalciferol, (DRISDOL) 1.25 MG (50000 UNIT) CAPS capsule Take  1 capsule (50,000 Units total) by mouth every 7 (seven) days.   No facility-administered medications prior to visit.    Review of Systems  Constitutional:  Negative for fatigue and fever.  Respiratory:  Negative for cough, shortness of breath and wheezing.   Cardiovascular:  Negative for chest pain and leg swelling.  Gastrointestinal:  Negative for abdominal pain.  Skin:  Positive for color change and rash.  Neurological:  Negative for dizziness, syncope, weakness and headaches.  All other systems reviewed and are negative.  Last CBC Lab Results  Component Value Date   WBC 4.8 11/19/2020   HGB 14.4 11/19/2020   HCT 39.2 11/19/2020   MCV 97 11/19/2020   MCH 35.7 (H) 11/19/2020   RDW 13.0 11/19/2020   PLT 265 44/31/5400   Last metabolic panel Lab Results  Component Value Date   GLUCOSE 90 11/19/2020   NA 140 11/19/2020   K  4.2 11/19/2020   CL 102 11/19/2020   CO2 20 11/19/2020   BUN 11 11/19/2020   CREATININE 0.77 11/19/2020   GFRNONAA 84 11/19/2020   CALCIUM 9.8 11/19/2020   PROT 7.2 11/19/2020   ALBUMIN 4.6 11/19/2020   LABGLOB 2.6 11/19/2020   AGRATIO 1.8 11/19/2020   BILITOT 0.4 11/19/2020   ALKPHOS 77 11/19/2020   AST 51 (H) 11/19/2020   ALT 60 (H) 11/19/2020   Last lipids Lab Results  Component Value Date   CHOL 265 (H) 11/19/2020   HDL 103 11/19/2020   LDLCALC 143 (H) 11/19/2020   TRIG 111 11/19/2020   CHOLHDL 2.6 11/19/2020   Last hemoglobin A1c Lab Results  Component Value Date   HGBA1C 5.6 11/19/2020   Last thyroid functions Lab Results  Component Value Date   TSH 3.970 11/19/2020   Last vitamin D Lab Results  Component Value Date   VD25OH 54.8 11/19/2020   Last vitamin B12 and Folate No results found for: VITAMINB12, FOLATE    Objective    Blood pressure 138/86, pulse 87, temperature 98.5 F (36.9 C), temperature source Oral, height 5\' 4"  (1.626 m), weight 132 lb 11.2 oz (60.2 kg), SpO2 96 %.  BP Readings from Last 3 Encounters:  11/21/21 138/86  11/19/20 121/64  07/27/20 (!) 142/82   Wt Readings from Last 3 Encounters:  11/21/21 132 lb 11.2 oz (60.2 kg)  11/19/20 131 lb 11.2 oz (59.7 kg)  07/27/20 131 lb 6.4 oz (59.6 kg)  Physical Exam Constitutional:      Appearance: She is not ill-appearing.  HENT:     Head: Normocephalic.     Right Ear: Tympanic membrane normal.     Left Ear: Tympanic membrane normal.     Nose: Nose normal. No congestion.     Mouth/Throat:     Mouth: Mucous membranes are dry.  Eyes:     Pupils: Pupils are equal, round, and reactive to light.  Neck:     Thyroid: No thyroid mass or thyromegaly.  Cardiovascular:     Rate and Rhythm: Normal rate and regular rhythm.     Pulses: Normal pulses.     Heart sounds: Normal heart sounds.  Pulmonary:     Effort: Pulmonary effort is normal.     Breath sounds: Normal breath sounds.   Abdominal:     General: Abdomen is flat.     Palpations: Abdomen is soft. There is no mass.     Tenderness: There is no abdominal tenderness. There is no guarding.  Lymphadenopathy:     Cervical: No  cervical adenopathy.  Skin:    General: Skin is warm.     Findings: Rash present.     Comments: She has a dry occasionally scaly erythematous rash across her bilateral upper extremities lower extremities some patches on her lower abdomen and low back.  Neurological:     Mental Status: She is alert and oriented to person, place, and time.  Psychiatric:        Mood and Affect: Mood normal.        Behavior: Behavior normal.    PHQ 2/9 Scores 11/21/2021 11/19/2020 06/09/2018  PHQ - 2 Score 0 0 0  PHQ- 9 Score 0 - 1   Last fall risk screening Fall Risk  11/21/2021  Falls in the past year? 0  Number falls in past yr: 0  Injury with Fall? 0  Risk for fall due to : No Fall Risks  Follow up -   Last Audit-C alcohol use screening Alcohol Use Disorder Test (AUDIT) 11/21/2021  1. How often do you have a drink containing alcohol? 3  2. How many drinks containing alcohol do you have on a typical day when you are drinking? 0  3. How often do you have six or more drinks on one occasion? 0  AUDIT-C Score 3  Alcohol Brief Interventions/Follow-up -   A score of 3 or more in women, and 4 or more in men indicates increased risk for alcohol abuse, EXCEPT if all of the points are from question 1   No results found for any visits on 11/21/21.  Assessment & Plan    Routine Health Maintenance and Physical Exam  Immunization History  Administered Date(s) Administered   Influenza Inj Mdck Quad Pf 09/09/2021   Influenza,inj,Quad PF,6+ Mos 09/13/2019   Moderna Sars-Covid-2 Vaccination 10/01/2020   PFIZER(Purple Top)SARS-COV-2 Vaccination 03/05/2020, 03/27/2020   Td 02/03/2011, 06/08/2017   Tdap 08/10/2007, 01/31/2011    Health Maintenance  Topic Date Due   COVID-19 Vaccine (4 - Booster for  Pfizer series) 11/26/2020   Zoster Vaccines- Shingrix (1 of 2) 02/19/2022 (Originally 02/10/2009)   MAMMOGRAM  01/05/2022   PAP SMEAR-Modifier  06/08/2022   COLONOSCOPY (Pts 45-4yrs Insurance coverage will need to be confirmed)  01/30/2026   TETANUS/TDAP  06/09/2027   INFLUENZA VACCINE  Completed   Hepatitis C Screening  Completed   HIV Screening  Completed   Pneumococcal Vaccine 62-39 Years old  Aged Out   HPV VACCINES  Aged Out    Discussed health benefits of physical activity, and encouraged her to engage in regular exercise appropriate for her age and condition.  Problem List Items Addressed This Visit       Musculoskeletal and Integument   Psoriasis    Refill Kenalog, explained hesitancy to try high-dose topical steroids due to her previous poor experience with steroid withdrawal.  Refer to dermatology.  Extensive nature of psoriasis likely needs biologic agent.      Relevant Medications   triamcinolone cream (KENALOG) 0.1 %   Other Relevant Orders   Ambulatory referral to Dermatology     Other   Avitaminosis D   Relevant Medications   Vitamin D, Ergocalciferol, (DRISDOL) 1.25 MG (50000 UNIT) CAPS capsule   Other Relevant Orders   Vitamin D (25 hydroxy)   Other Visit Diagnoses     Encounter for annual physical exam    -  Primary   Relevant Orders   MM 3D SCREEN BREAST BILATERAL   Ambulatory referral to Gastroenterology   Hypothyroidism, unspecified type  Relevant Orders   TSH + free T4   Hyperlipidemia, unspecified hyperlipidemia type       Relevant Orders   Comprehensive Metabolic Panel (CMET)   Lipid Profile   Screening for malignant neoplasm of colon       Relevant Orders   Ambulatory referral to Gastroenterology   Screening mammogram for breast cancer       Relevant Orders   MM 3D SCREEN BREAST BILATERAL        Return in about 1 year (around 11/21/2022) for CPE.     I, [name], PA-C have reviewed all documentation for this visit. The  documentation on  11/21/2021 for the exam, diagnosis, procedures, and orders are all accurate and complete.    [name], PA-C  [facility] [phone] (phone) [phone] (fax)  [place]

## 2021-11-21 NOTE — Assessment & Plan Note (Signed)
Refill Kenalog, explained hesitancy to try high-dose topical steroids due to her previous poor experience with steroid withdrawal.  Refer to dermatology.  Extensive nature of psoriasis likely needs biologic agent.

## 2021-11-22 ENCOUNTER — Encounter: Payer: Self-pay | Admitting: Physician Assistant

## 2021-11-22 ENCOUNTER — Encounter: Payer: BC Managed Care – PPO | Admitting: Family Medicine

## 2021-11-22 LAB — COMPREHENSIVE METABOLIC PANEL
ALT: 52 IU/L — ABNORMAL HIGH (ref 0–32)
AST: 45 IU/L — ABNORMAL HIGH (ref 0–40)
Albumin/Globulin Ratio: 1.9 (ref 1.2–2.2)
Albumin: 4.9 g/dL — ABNORMAL HIGH (ref 3.8–4.8)
Alkaline Phosphatase: 90 IU/L (ref 44–121)
BUN/Creatinine Ratio: 14 (ref 12–28)
BUN: 11 mg/dL (ref 8–27)
Bilirubin Total: 0.5 mg/dL (ref 0.0–1.2)
CO2: 24 mmol/L (ref 20–29)
Calcium: 9.9 mg/dL (ref 8.7–10.3)
Chloride: 100 mmol/L (ref 96–106)
Creatinine, Ser: 0.77 mg/dL (ref 0.57–1.00)
Globulin, Total: 2.6 g/dL (ref 1.5–4.5)
Glucose: 92 mg/dL (ref 70–99)
Potassium: 4.5 mmol/L (ref 3.5–5.2)
Sodium: 141 mmol/L (ref 134–144)
Total Protein: 7.5 g/dL (ref 6.0–8.5)
eGFR: 87 mL/min/{1.73_m2} (ref >59)

## 2021-11-22 LAB — LIPID PANEL
Chol/HDL Ratio: 2.6 ratio (ref 0.0–4.4)
Cholesterol, Total: 261 mg/dL — ABNORMAL HIGH (ref 100–199)
HDL: 99 mg/dL (ref >39)
LDL Chol Calc (NIH): 142 mg/dL — ABNORMAL HIGH (ref 0–99)
Triglycerides: 121 mg/dL (ref 0–149)
VLDL Cholesterol Cal: 20 mg/dL (ref 5–40)

## 2021-11-22 LAB — VITAMIN D 25 HYDROXY (VIT D DEFICIENCY, FRACTURES): Vit D, 25-Hydroxy: 46.8 ng/mL (ref 30.0–100.0)

## 2021-11-22 LAB — TSH+FREE T4
Free T4: 1.11 ng/dL (ref 0.82–1.77)
TSH: 5.64 u[IU]/mL — ABNORMAL HIGH (ref 0.450–4.500)

## 2021-12-02 ENCOUNTER — Encounter: Payer: Self-pay | Admitting: Physician Assistant

## 2021-12-04 ENCOUNTER — Telehealth: Payer: Self-pay

## 2021-12-04 DIAGNOSIS — E039 Hypothyroidism, unspecified: Secondary | ICD-10-CM

## 2021-12-04 NOTE — Telephone Encounter (Signed)
Called patient no answer no way to leave voicemail

## 2021-12-04 NOTE — Telephone Encounter (Signed)
CALLED PATIENT NO ANSWER LEFT VOICEMAIL FOR A CALL BACK °Letter sent °

## 2021-12-04 NOTE — Telephone Encounter (Signed)
Patient has reviewed results on her mychart.  12/02/21 1:20 pm.

## 2021-12-04 NOTE — Telephone Encounter (Signed)
-----   Message from [name], PA-C sent at 11/22/2021 12:01 PM EST ----- Lfts always elevated, slightly improved/stable. Cholesterol stable but elevated for years, I encourage lifestyle changes (low fat diet, exercise) TSH elevated but normal T4. No changes for now but recommend rechecking 6-8 weeks, both TSH and T4 Vit D okay, I did refill supplement, recheck 3 months please

## 2021-12-09 ENCOUNTER — Ambulatory Visit: Payer: BC Managed Care – PPO | Admitting: Family Medicine

## 2021-12-13 ENCOUNTER — Other Ambulatory Visit: Payer: Self-pay | Admitting: Physician Assistant

## 2021-12-13 ENCOUNTER — Ambulatory Visit
Admission: RE | Admit: 2021-12-13 | Discharge: 2021-12-13 | Disposition: A | Discharge status: Home / Self Care | Payer: BC Managed Care – PPO | Source: Ambulatory Visit | Attending: Physician Assistant | Admitting: Physician Assistant

## 2021-12-13 ENCOUNTER — Other Ambulatory Visit: Payer: Self-pay

## 2021-12-13 ENCOUNTER — Encounter: Payer: Self-pay | Admitting: Physician Assistant

## 2021-12-13 DIAGNOSIS — R928 Other abnormal and inconclusive findings on diagnostic imaging of breast: Secondary | ICD-10-CM

## 2021-12-13 DIAGNOSIS — Z1231 Encounter for screening mammogram for malignant neoplasm of breast: Secondary | ICD-10-CM | POA: Insufficient documentation

## 2021-12-13 DIAGNOSIS — Z0000 Encounter for general adult medical examination without abnormal findings: Secondary | ICD-10-CM | POA: Diagnosis present

## 2021-12-13 DIAGNOSIS — N6320 Unspecified lump in the left breast, unspecified quadrant: Secondary | ICD-10-CM

## 2021-12-25 ENCOUNTER — Ambulatory Visit
Admission: RE | Admit: 2021-12-25 | Discharge: 2021-12-25 | Disposition: A | Discharge status: Home / Self Care | Payer: BC Managed Care – PPO | Source: Ambulatory Visit | Attending: Physician Assistant | Admitting: Physician Assistant

## 2021-12-25 ENCOUNTER — Other Ambulatory Visit: Payer: Self-pay

## 2021-12-25 DIAGNOSIS — N6320 Unspecified lump in the left breast, unspecified quadrant: Secondary | ICD-10-CM

## 2021-12-25 DIAGNOSIS — R928 Other abnormal and inconclusive findings on diagnostic imaging of breast: Secondary | ICD-10-CM | POA: Insufficient documentation

## 2021-12-26 ENCOUNTER — Telehealth: Payer: Self-pay

## 2021-12-26 ENCOUNTER — Other Ambulatory Visit: Payer: Self-pay | Admitting: Physician Assistant

## 2021-12-26 DIAGNOSIS — R928 Other abnormal and inconclusive findings on diagnostic imaging of breast: Secondary | ICD-10-CM

## 2021-12-26 DIAGNOSIS — N6320 Unspecified lump in the left breast, unspecified quadrant: Secondary | ICD-10-CM

## 2021-12-26 NOTE — Telephone Encounter (Signed)
Copied from [place] [id number]. Topic: General - Other >> Dec 26, 2021  9:42 AM Greggory Keen D wrote: Reason for CRM: Pt called returning a call to the office.  She said there was no message left but she thinks it is regarding orders for a biopsy.  She just ask that someone return her call.  # [phone].

## 2021-12-27 ENCOUNTER — Other Ambulatory Visit: Payer: Self-pay | Admitting: Family Medicine

## 2021-12-27 NOTE — Telephone Encounter (Signed)
Requested medications are due for refill today.  yes  Requested medications are on the active medications list.  yes  Last refill. 07/02/2021  Future visit scheduled.   no  Notes to clinic.  Failed protocol d/t abnormal labs.    Requested Prescriptions  Pending Prescriptions Disp Refills   levothyroxine (SYNTHROID) 50 MCG tablet [Pharmacy Med Name: LEVOTHYROXINE 50 MCG TABLET] 90 tablet 1    Sig: TAKE 1 TABLET BY MOUTH EVERY DAY     Endocrinology:  Hypothyroid Agents Failed - 12/27/2021  1:27 AM      Failed - TSH needs to be rechecked within 3 months after an abnormal result. Refill until TSH is due.      Failed - TSH in normal range and within 360 days    TSH  Date Value Ref Range Status  11/21/2021 5.640 (H) 0.450 - 4.500 uIU/mL Final          Passed - Valid encounter within last 12 months    Recent Outpatient Visits           1 month ago Encounter for annual physical exam   [facility] [name], PA-C   1 year ago Annual physical exam   [place], [place], [place]   1 year ago Hypothyroidism due to acquired atrophy of thyroid   [place], [place], [place]   2 years ago Encounter for annual physical exam   [place], [place], [place]   3 years ago Annual physical exam   [facility] [place], [place]

## 2022-01-01 DIAGNOSIS — C50919 Malignant neoplasm of unspecified site of unspecified female breast: Secondary | ICD-10-CM

## 2022-01-01 HISTORY — DX: Malignant neoplasm of unspecified site of unspecified female breast: C50.919

## 2022-01-08 ENCOUNTER — Ambulatory Visit
Admission: RE | Admit: 2022-01-08 | Discharge: 2022-01-08 | Disposition: A | Discharge status: Home / Self Care | Payer: BC Managed Care – PPO | Source: Ambulatory Visit | Attending: Physician Assistant | Admitting: Physician Assistant

## 2022-01-08 ENCOUNTER — Other Ambulatory Visit: Payer: Self-pay

## 2022-01-08 DIAGNOSIS — R928 Other abnormal and inconclusive findings on diagnostic imaging of breast: Secondary | ICD-10-CM | POA: Diagnosis not present

## 2022-01-08 DIAGNOSIS — N6320 Unspecified lump in the left breast, unspecified quadrant: Secondary | ICD-10-CM | POA: Diagnosis present

## 2022-01-08 HISTORY — PX: BREAST BIOPSY: SHX20

## 2022-01-09 ENCOUNTER — Other Ambulatory Visit: Payer: Self-pay | Admitting: General Surgery

## 2022-01-09 DIAGNOSIS — C50912 Malignant neoplasm of unspecified site of left female breast: Secondary | ICD-10-CM

## 2022-01-09 DIAGNOSIS — C50919 Malignant neoplasm of unspecified site of unspecified female breast: Secondary | ICD-10-CM

## 2022-01-09 NOTE — Progress Notes (Signed)
Navigation initiated.  Scheduled with Dr. Bary Castilla, and Dr. Rogue Bussing per patient. Explained Dr. Bary Castilla would like to order MRI , and patient is agreeable to this request.

## 2022-01-10 ENCOUNTER — Encounter: Payer: Self-pay | Admitting: *Deleted

## 2022-01-13 ENCOUNTER — Other Ambulatory Visit: Payer: Self-pay

## 2022-01-13 ENCOUNTER — Ambulatory Visit
Admission: RE | Admit: 2022-01-13 | Discharge: 2022-01-13 | Disposition: A | Discharge status: Home / Self Care | Payer: BC Managed Care – PPO | Source: Ambulatory Visit | Attending: General Surgery | Admitting: General Surgery

## 2022-01-13 DIAGNOSIS — C50912 Malignant neoplasm of unspecified site of left female breast: Secondary | ICD-10-CM

## 2022-01-13 MED ORDER — GADOBUTROL 1 MMOL/ML IV SOLN: 6.0000 mL | Freq: Once | INTRAVENOUS | Status: DC | PRN

## 2022-01-14 ENCOUNTER — Ambulatory Visit
Admission: RE | Admit: 2022-01-14 | Discharge: 2022-01-14 | Disposition: A | Discharge status: Home / Self Care | Payer: BC Managed Care – PPO | Source: Ambulatory Visit | Attending: General Surgery | Admitting: General Surgery

## 2022-01-14 DIAGNOSIS — C50912 Malignant neoplasm of unspecified site of left female breast: Secondary | ICD-10-CM | POA: Diagnosis present

## 2022-01-14 MED ORDER — GADOBUTROL 1 MMOL/ML IV SOLN
7.5000 mL | Freq: Once | INTRAVENOUS | Status: AC | PRN
  Administered 2022-01-14: 7.5 mL via INTRAVENOUS

## 2022-01-15 ENCOUNTER — Other Ambulatory Visit: Payer: Self-pay | Admitting: General Surgery

## 2022-01-15 DIAGNOSIS — C50912 Malignant neoplasm of unspecified site of left female breast: Secondary | ICD-10-CM

## 2022-01-17 ENCOUNTER — Other Ambulatory Visit: Payer: Self-pay | Admitting: General Surgery

## 2022-01-17 ENCOUNTER — Encounter: Payer: Self-pay | Admitting: *Deleted

## 2022-01-17 ENCOUNTER — Other Ambulatory Visit: Payer: Self-pay

## 2022-01-17 ENCOUNTER — Inpatient Hospital Stay: Payer: BC Managed Care – PPO | Attending: Internal Medicine | Admitting: Internal Medicine

## 2022-01-17 ENCOUNTER — Inpatient Hospital Stay: Payer: BC Managed Care – PPO

## 2022-01-17 ENCOUNTER — Encounter: Payer: Self-pay | Admitting: Internal Medicine

## 2022-01-17 DIAGNOSIS — Z79899 Other long term (current) drug therapy: Secondary | ICD-10-CM | POA: Insufficient documentation

## 2022-01-17 DIAGNOSIS — C50812 Malignant neoplasm of overlapping sites of left female breast: Secondary | ICD-10-CM | POA: Insufficient documentation

## 2022-01-17 DIAGNOSIS — Z800 Family history of malignant neoplasm of digestive organs: Secondary | ICD-10-CM | POA: Diagnosis not present

## 2022-01-17 DIAGNOSIS — C50919 Malignant neoplasm of unspecified site of unspecified female breast: Secondary | ICD-10-CM

## 2022-01-17 DIAGNOSIS — Z803 Family history of malignant neoplasm of breast: Secondary | ICD-10-CM | POA: Insufficient documentation

## 2022-01-17 DIAGNOSIS — N6325 Unspecified lump in the left breast, overlapping quadrants: Secondary | ICD-10-CM

## 2022-01-17 NOTE — Assessment & Plan Note (Addendum)
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  Breast profile pending.  # I had a long discussion with the patient in general regarding the treatment options of breast cancer including-surgery; adjuvant radiation; role of adjuvant systemic therapy including-chemotherapy antihormone therapy.    # Discussed surgical options-sentinel lymph node biopsy with lumpectomy versus mastectomy.  Discussed that lumpectomy is usually followed by adjuvant radiation.  Whereas mastectomy typically does not indicate radiation.  Patient has been evaluated by Dr. Bary Castilla; awaiting surgery on 2/24. Patient awaiting lumpectomy with sentinel lymph node evaluation; followed by radiation.   # Systemic therapy options include-chemotherapy or hormonal therapy breast cancer profile/which is still pending.  Also might need molecular testing [Oncotype/Mammaprint] after surgery.  Need for systemic adjuvant endocrine therapy based upon receptor status [which again still pending].  # Clinical trials: Blood collection biomarker study-patient declined.  # genetics: Given the family history of malignancies recommend evaluation with genetics.  However low clinical suspicion for any genetic predisposition to malignancy.  Thank you for allowing me to participate in the care of your pleasant patient. Please do not hesitate to contact me with questions or concerns in the interim.  # DISPOSITION:  # No labs- # genetic counselor re: genetics/breast cancer # follow up in 3 weeks- Dr.B

## 2022-01-17 NOTE — Progress Notes (Signed)
[facility] [place] NOTE  Patient Care Team: [name], Hershal Coria as PCP - General (Physician Assistant) Theodore Demark, RN as Oncology Nurse Navigator Cammie Sickle, MD as Consulting Physician (Oncology) Bary Castilla Forest Gleason, MD as Consulting Physician (General Surgery)  CHIEF COMPLAINTS/PURPOSE OF CONSULTATION: Breast cancer  # IMPRESSION: 1. A 6 mm mass in the LEFT inner breast at 9 o'clock 1 cm from the nipple with associated architectural distortion is indeterminate. Recommend ultrasound-guided biopsy for definitive characterization with attention on post marker placement mammogram to assess for mammographic/sonographic correlation. 2. No suspicious LEFT axillary adenopathy.  DIAGNOSIS:  A.  BREAST, LEFT 9:00 1 CM FN; ULTRASOUND-GUIDED BIOPSY:  - INVASIVE MAMMARY CARCINOMA WITH LOBULAR FEATURES.  - LOBULAR NEOPLASIA.   Size of invasive carcinoma: 8 mm in this sample  Histologic grade of invasive carcinoma: Grade 2                       Glandular/tubular differentiation score: 3                       Nuclear pleomorphism score: 2                       Mitotic rate score: 1                       Total score: 6  Ductal carcinoma in situ: Not identified  Lymphovascular invasion: Not identified   ER/PR/HER2: Immunohistochemistry will be performed on block A1, with  reflex to [place] for HER2 2+. The results will be reported in an addendum.   Comment:  The definitive grade will be assigned on the excisional specimen.    Oncology History   No history exists.     HISTORY OF PRESENTING ILLNESS:  Tricia Rivera 61 y.o.  female female with no prior history of breast cancer/or malignancies has been referred to [place] for further evaluation recommendations for new diagnosis of breast cancer.   Patient states she was found to have an abnormal screening mammogram which led to diagnostic mammogram/ultrasound/followed by biopsy-as summarized above.  Patient  otherwise denies any symptoms.   Family history of breast cancer:mat aunt- breast cancer  Family history of other cancers: mom- pancreatic cancer; dad- liver; mat grandpa- liver   Used estrogen and progesterone therapy: none  Previous biopsy: none  --------------------   Review of Systems  Constitutional:  Negative for chills, diaphoresis, fever, malaise/fatigue and weight loss.  HENT:  Negative for nosebleeds and sore throat.   Eyes:  Negative for double vision.  Respiratory:  Negative for cough, hemoptysis, sputum production, shortness of breath and wheezing.   Cardiovascular:  Negative for chest pain, palpitations, orthopnea and leg swelling.  Gastrointestinal:  Negative for abdominal pain, blood in stool, constipation, diarrhea, heartburn, melena, nausea and vomiting.  Genitourinary:  Negative for dysuria, frequency and urgency.  Musculoskeletal:  Negative for back pain and joint pain.  Skin: Negative.  Negative for itching and rash.  Neurological:  Negative for dizziness, tingling, focal weakness, weakness and headaches.  Endo/Heme/Allergies:  Does not bruise/bleed easily.  Psychiatric/Behavioral:  Negative for depression. The patient is not nervous/anxious and does not have insomnia.     MEDICAL HISTORY:  Past Medical History:  Diagnosis Date   ADD (attention deficit disorder)    Anxiety    Depression    Hyperlipidemia    Malignant neoplasm of left breast ([place])  Psoriasis    Vitamin D deficiency     SURGICAL HISTORY: Past Surgical History:  Procedure Laterality Date   BREAST BIOPSY Left 01/08/2022   [place] bs, venus marker, path pending   BUNIONECTOMY  12/01/2008   BUNIONECTOMY N/A    2015    SOCIAL HISTORY: Social History   Socioeconomic History   Marital status: Married    Spouse name: Not on file   Number of children: Not on file   Years of education: Not on file   Highest education level: Not on file  Occupational History   Not on file  Tobacco Use    Smoking status: Never   Smokeless tobacco: Never  Vaping Use   Vaping Use: Never used  Substance and Sexual Activity   Alcohol use: Not Currently    Alcohol/week: 3.0 standard drinks    Types: 3 Glasses of wine per week   Drug use: No   Sexual activity: Not Currently  Other Topics Concern   Not on file  Social History Narrative   Lives in [place]; with husband; son' grandson. Daughter- in [place]. 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    Social Determinants of Health   Financial Resource Strain: Not on file  Food Insecurity: Not on file  Transportation Needs: Not on file  Physical Activity: Not on file  Stress: Not on file  Social Connections: Not on file  Intimate Partner Violence: Not on file    FAMILY HISTORY: Family History  Problem Relation Age of Onset   Pancreatic cancer Mother    Liver cancer Father    Breast cancer Maternal Aunt    Liver cancer Maternal Grandfather     ALLERGIES:  has No Known Allergies.  MEDICATIONS:  Current Outpatient Medications  Medication Sig Dispense Refill   levothyroxine (SYNTHROID) 50 MCG tablet TAKE 1 TABLET BY MOUTH EVERY DAY 90 tablet 0   lidocaine-prilocaine (EMLA) cream SMARTSIG:1 Topical Daily     triamcinolone cream (KENALOG) 0.1 % APPLY TWICE A DAY TO AFFECTED AREAS 30 g 6   Vitamin D, Ergocalciferol, (DRISDOL) 1.25 MG (50000 UNIT) CAPS capsule Take 1 capsule (50,000 Units total) by mouth every 7 (seven) days. 12 capsule 1   ZORYVE 0.3 % CREA Apply 1 application topically daily as needed (Psoriasis). (Patient not taking: Reported on 01/17/2022)     No current facility-administered medications for this visit.      Marland Kitchen  PHYSICAL EXAMINATION: ECOG PERFORMANCE STATUS: 0 - Asymptomatic  Vitals:   01/17/22 1135  BP: 138/80  Pulse: 80  Temp: 98.9 F (37.2 C)  SpO2: 98%   Filed Weights   01/17/22 1135  Weight: 131 lb 9.6 oz (59.7 kg)    Physical Exam Vitals and nursing note reviewed.  HENT:      Head: Normocephalic and atraumatic.     Mouth/Throat:     Pharynx: Oropharynx is clear.  Eyes:     Extraocular Movements: Extraocular movements intact.     Pupils: Pupils are equal, round, and reactive to light.  Cardiovascular:     Rate and Rhythm: Normal rate and regular rhythm.  Pulmonary:     Comments: Decreased breath sounds bilaterally.  Abdominal:     Palpations: Abdomen is soft.  Musculoskeletal:        General: Normal range of motion.     Cervical back: Normal range of motion.  Skin:    General: Skin is warm.  Neurological:     General: No focal deficit present.  Mental Status: She is alert and oriented to person, place, and time.  Psychiatric:        Behavior: Behavior normal.        Judgment: Judgment normal.    LABORATORY DATA:  I have reviewed the data as listed Lab Results  Component Value Date   WBC 4.8 11/19/2020   HGB 14.4 11/19/2020   HCT 39.2 11/19/2020   MCV 97 11/19/2020   PLT 265 11/19/2020   Recent Labs    11/21/21 0959  NA 141  K 4.5  CL 100  CO2 24  GLUCOSE 92  BUN 11  CREATININE 0.77  CALCIUM 9.9  PROT 7.5  ALBUMIN 4.9*  AST 45*  ALT 52*  ALKPHOS 90  BILITOT 0.5    RADIOGRAPHIC STUDIES: I have personally reviewed the radiological images as listed and agreed with the findings in the report. MR BREAST BILATERAL W WO CONTRAST INC CAD  Result Date: 01/15/2022 CLINICAL DATA:  Biopsy proven invasive mammary carcinoma in the 9 o'clock region of the left breast. EXAM: BILATERAL BREAST MRI WITH AND WITHOUT CONTRAST TECHNIQUE: Multiplanar, multisequence MR images of both breasts were obtained prior to and following the intravenous administration of 7.5 ml of Gadavist Three-dimensional MR images were rendered by post-processing of the original MR data on an independent workstation. The three-dimensional MR images were interpreted, and findings are reported in the following complete MRI report for this study. Three dimensional images  were evaluated at the independent interpreting workstation using the DynaCAD thin client. COMPARISON:  Previous exam(s). FINDINGS: Breast composition: c. Heterogeneous fibroglandular tissue. Background parenchymal enhancement: Moderate. Right breast: No mass or abnormal enhancement. Left breast: Post biopsy changes are seen in the 9 o'clock region of the left breast. There is an irregular enhancing mass measuring 1.3 cm (series 13, image 58). Signal void artifact is seen in this area from the biopsy marker clip. Lymph nodes: No abnormal appearing lymph nodes. Ancillary findings:  None. IMPRESSION: 1.3 cm enhancing mass in the 9 o'clock region of the left breast corresponding with the biopsy proven invasive mammary carcinoma. RECOMMENDATION: Treatment planning of the known left breast cancer is recommended. BI-RADS CATEGORY  6: Known biopsy-proven malignancy. Electronically Signed   By: Lillia Mountain M.D.   On: 01/15/2022 14:25  US BREAST LTD UNI LEFT INC AXILLA  Result Date: 12/25/2021 CLINICAL DATA:  Callback for LEFT breast mass. EXAM: DIGITAL DIAGNOSTIC UNILATERAL LEFT MAMMOGRAM WITH TOMOSYNTHESIS AND CAD; ULTRASOUND LEFT BREAST LIMITED TECHNIQUE: Left digital diagnostic mammography and breast tomosynthesis was performed. The images were evaluated with computer-aided detection.; Targeted ultrasound examination of the left breast was performed. COMPARISON:  Previous exam(s). ACR Breast Density Category c: The breast tissue is heterogeneously dense, which may obscure small masses. FINDINGS: Spot compression tomosynthesis views demonstrate persistent architectural distortion in the LEFT upper outer retroareolar breast. It is best seen on spot MLO slice 23 and spot CC slice 23 with a correlate on ML slice 24. On physical exam, no suspicious mass is appreciated. Targeted ultrasound was performed of the LEFT inner breast. At 9 o'clock 1 cm from the nipple, there is an irregular hypoechoic mass with indistinct margins  and posterior acoustic shadowing. It is estimated to measure 6 x 4 x 4 mm although exact measurements are difficult to delineate due to indistinct margins. Targeted ultrasound was performed of the LEFT axilla. No suspicious LEFT axillary lymph nodes are seen. IMPRESSION: 1. A 6 mm mass in the LEFT inner breast at 9 o'clock 1 cm  from the nipple with associated architectural distortion is indeterminate. Recommend ultrasound-guided biopsy for definitive characterization with attention on post marker placement mammogram to assess for mammographic/sonographic correlation. 2. No suspicious LEFT axillary adenopathy. RECOMMENDATION: LEFT breast ultrasound-guided biopsy x1. I have discussed the findings and recommendations with the patient. The biopsy procedure was discussed with the patient and questions were answered. Patient expressed their understanding of the biopsy recommendation. Patient will be scheduled for biopsy at her earliest convenience by the schedulers. Ordering provider will be notified. If applicable, a reminder letter will be sent to the patient regarding the next appointment. BI-RADS CATEGORY  4: Suspicious. Electronically Signed   By: Valentino Saxon M.D.   On: 12/25/2021 15:00  MM DIAG BREAST TOMO UNI LEFT  Result Date: 12/25/2021 CLINICAL DATA:  Callback for LEFT breast mass. EXAM: DIGITAL DIAGNOSTIC UNILATERAL LEFT MAMMOGRAM WITH TOMOSYNTHESIS AND CAD; ULTRASOUND LEFT BREAST LIMITED TECHNIQUE: Left digital diagnostic mammography and breast tomosynthesis was performed. The images were evaluated with computer-aided detection.; Targeted ultrasound examination of the left breast was performed. COMPARISON:  Previous exam(s). ACR Breast Density Category c: The breast tissue is heterogeneously dense, which may obscure small masses. FINDINGS: Spot compression tomosynthesis views demonstrate persistent architectural distortion in the LEFT upper outer retroareolar breast. It is best seen on spot MLO  slice 23 and spot CC slice 23 with a correlate on ML slice 24. On physical exam, no suspicious mass is appreciated. Targeted ultrasound was performed of the LEFT inner breast. At 9 o'clock 1 cm from the nipple, there is an irregular hypoechoic mass with indistinct margins and posterior acoustic shadowing. It is estimated to measure 6 x 4 x 4 mm although exact measurements are difficult to delineate due to indistinct margins. Targeted ultrasound was performed of the LEFT axilla. No suspicious LEFT axillary lymph nodes are seen. IMPRESSION: 1. A 6 mm mass in the LEFT inner breast at 9 o'clock 1 cm from the nipple with associated architectural distortion is indeterminate. Recommend ultrasound-guided biopsy for definitive characterization with attention on post marker placement mammogram to assess for mammographic/sonographic correlation. 2. No suspicious LEFT axillary adenopathy. RECOMMENDATION: LEFT breast ultrasound-guided biopsy x1. I have discussed the findings and recommendations with the patient. The biopsy procedure was discussed with the patient and questions were answered. Patient expressed their understanding of the biopsy recommendation. Patient will be scheduled for biopsy at her earliest convenience by the schedulers. Ordering provider will be notified. If applicable, a reminder letter will be sent to the patient regarding the next appointment. BI-RADS CATEGORY  4: Suspicious. Electronically Signed   By: Valentino Saxon M.D.   On: 12/25/2021 15:00  MM CLIP PLACEMENT LEFT  Result Date: 01/08/2022 CLINICAL DATA:  Left breast 9 o'clock mass EXAM: 3D DIAGNOSTIC LEFT MAMMOGRAM POST ULTRASOUND BIOPSY COMPARISON:  Previous exam(s). FINDINGS: 3D Mammographic images were obtained following ultrasound guided biopsy of left breast 9 o'clock mass. The biopsy marking clip is in expected position at the site of biopsy. IMPRESSION: Appropriate positioning of the Venus shaped biopsy marking clip at the site of  biopsy in the left breast 9 o'clock. Final Assessment: Post Procedure Mammograms for Marker Placement Electronically Signed   By: Fidela Salisbury M.D.   On: 01/08/2022 10:21  [place] LT BREAST BX W LOC DEV 1ST LESION IMG BX SPEC US GUIDE  Addendum Date: 01/09/2022   ADDENDUM REPORT: 01/09/2022 14:30 ADDENDUM: PATHOLOGY revealed: A. BREAST, LEFT 9:00 1 CM FN; ULTRASOUND-GUIDED BIOPSY: - INVASIVE MAMMARY CARCINOMA WITH LOBULAR FEATURES. - LOBULAR NEOPLASIA.  Size of invasive carcinoma: 8 mm in this sample. Histologic grade of invasive carcinoma: Grade 2. Ductal carcinoma in situ: Not identified. Lymphovascular invasion: Not identified. Pathology results are CONCORDANT with imaging findings, per Dr. Fidela Salisbury. Pathology results and recommendations were discussed with patient via telephone on 01/09/2022. Patient reported biopsy site doing well with no adverse symptoms, and only slight tenderness at the site. Post biopsy care instructions were reviewed, questions were answered and my direct phone number was provided. Patient was instructed to call [facility] for any additional questions or concerns related to biopsy site. RECOMMENDATIONS: 1. Surgical consultation. Request for surgical consultation relayed to Al Pimple RN and Tanya Nones RN at [facility] by Electa Sniff RN on 01/09/2022. 2. Consider bilateral breast MRI given lobular features and to evaluate extent of breast disease. Pathology results reported by Electa Sniff RN on 01/09/2022. Electronically Signed   By: Fidela Salisbury M.D.   On: 01/09/2022 14:30   Result Date: 01/09/2022 CLINICAL DATA:  Left breast 9 o'clock mass. EXAM: ULTRASOUND GUIDED LEFT BREAST CORE NEEDLE BIOPSY COMPARISON:  Previous exam(s). PROCEDURE: I met with the patient and we discussed the procedure of ultrasound-guided biopsy, including benefits and alternatives. We discussed the high likelihood of a successful procedure. We discussed the risks of  the procedure, including infection, bleeding, tissue injury, clip migration, and inadequate sampling. Informed written consent was given. The usual time-out protocol was performed immediately prior to the procedure. Lesion quadrant: Upper inner quadrant Using sterile technique and 1% Lidocaine as local anesthetic, under direct ultrasound visualization, a 14 gauge spring-loaded device was used to perform biopsy of left breast 9 o'clock mass using a inferior approach. At the conclusion of the procedure venus shaped tissue marker clip was deployed into the biopsy cavity. Follow up 2 view mammogram was performed and dictated separately. IMPRESSION: Ultrasound guided biopsy of left breast.  No apparent complications. Electronically Signed: By: Fidela Salisbury M.D. On: 01/08/2022 10:19    ASSESSMENT & PLAN:   Mass overlapping multiple quadrants of left breast #Stage I clinical-breast cancer 9 o'clock position-invasive mammary carcinoma with lobular features.  Breast profile pending.  # I had a long discussion with the patient in general regarding the treatment options of breast cancer including-surgery; adjuvant radiation; role of adjuvant systemic therapy including-chemotherapy antihormone therapy.    # Discussed surgical options-sentinel lymph node biopsy with lumpectomy versus mastectomy.  Discussed that lumpectomy is usually followed by adjuvant radiation.  Whereas mastectomy typically does not indicate radiation.  Patient has been evaluated by Dr. Bary Castilla; awaiting surgery on 2/24. Patient awaiting lumpectomy with sentinel lymph node evaluation; followed by radiation.   # Systemic therapy options include-chemotherapy or hormonal therapy breast cancer profile/which is still pending.  Also might need molecular testing [Oncotype/Mammaprint] after surgery.  Need for systemic adjuvant endocrine therapy based upon receptor status [which again still pending].  # Clinical trials: Blood collection biomarker  study-patient declined.  # genetics: Given the family history of malignancies recommend evaluation with genetics.  However low clinical suspicion for any genetic predisposition to malignancy.  Thank you for allowing me to participate in the care of your pleasant patient. Please do not hesitate to contact me with questions or concerns in the interim.  # DISPOSITION:  # No labs- # genetic counselor re: genetics/breast cancer # follow up in 3 weeks- Dr.B    All questions were answered. The patient/family knows to call the clinic with any problems, questions or concerns.  Cammie Sickle, MD 01/17/2022 4:44 PM

## 2022-01-17 NOTE — Progress Notes (Signed)
Subjective:     Patient ID: Tricia Rivera is a 61 y.o. female.   HPI   The following portions of the patient's history were reviewed and updated as appropriate.   This an established patient is here today for: office visit. The patient is here today for a left breast ultrasound. She is scheduled for a left breast lumpectomy with SLN on 01-24-22 at [facility].        Chief Complaint  Patient presents with   Pre-op Exam      BP 130/76    Pulse 86    Temp 36.7 C (98.1 F)    Ht 162.6 cm (5\' 4" )    Wt 59.9 kg (132 lb)    SpO2 97%    BMI 22.66 kg/m        Past Medical History:  Diagnosis Date   Abnormal EKG     ADD (attention deficit disorder)     Anxiety     Claustrophobia     Depression     Hallux abductovalgus      bilateral deformities   Hyperlipidemia     Psoriasis     Tubular adenoma of colon 01/31/2016   Vitamin D deficiency             Past Surgical History:  Procedure Laterality Date   bunionectomy   12/01/2008   HALLUX VALGUS CORRECTION Right 2015   COLONOSCOPY   01/31/2016    Tubular adenoma of colon/Repeat 46yrs/MGR   ultrasound guided breast biopsy Left 01/08/2022                OB History     Gravida  2   Para  2   Term      Preterm      AB      Living         SAB      IAB      Ectopic      Molar      Multiple      Live Births           Obstetric Comments  Age at first period 37 Age of first pregnancy 44             Social History           Socioeconomic History   Marital status: Married  Tobacco Use   Smoking status: Never      Passive exposure: Never   Smokeless tobacco: Never  Substance and Sexual Activity   Alcohol use: No   Drug use: No   Sexual activity: Defer        No Known Allergies   Current Medications        Current Outpatient Medications  Medication Sig Dispense Refill   acetaminophen (TYLENOL) 325 MG tablet Take 650 mg by mouth every 4 (four) hours as needed for Pain       clobetasol (TEMOVATE)  0.05 % ointment Apply to psoriasis areas twice daily until clear. Can apply under occlusion with saran wrap nightly as needed as well       ergocalciferol, vitamin D2, 1,250 mcg (50,000 unit) capsule Take 50,000 Units by mouth every 7 (seven) days       levothyroxine (SYNTHROID) 50 MCG tablet Take 50 mcg by mouth once daily       triamcinolone 0.1 % cream     1   ZORYVE 0.3 % Crea Apply topically once daily Apply to affected area  hydrocortisone (ANUSOL-HC) 25 mg suppository Place 25 mg rectally as directed for Hemorrhoids. (Patient not taking: Reported on 01/14/2022)       lidocaine-prilocaine (EMLA) cream Apply to areola one hour prior to arrival day of procedure. Cover with saran wrap. 5 g 0    No current facility-administered medications for this visit.             Family History  Problem Relation Age of Onset   Pancreatic cancer Mother     Liver cancer Father     Cancer Maternal Grandfather     Liver cancer Paternal Grandfather     Breast cancer Maternal Aunt     Glaucoma Other     Cancer Other     Colon polyps Neg Hx     Colon cancer Neg Hx     Inflammatory bowel disease Neg Hx          Labs and Radiology:      Limited left breast ultrasound January 16, 2022:   The patient has been asked to return for repeat left breast ultrasound to determine if preoperative localization would be required.  In the retroareolar tissue, less than 1 cm from the nipple, an irregular mass measuring 1 x 1.2 x 1.4 cm is identified.  This comes close to the overlying dermis.  BI-RADS-6.   On the original ultrasound of December 25, 2021 the mass did not approach the dermis, and some of the changes evident are related to her recently completed biopsy.           Review of Systems  Constitutional: Negative for chills and fever.  Respiratory: Negative for cough.          Objective:   Physical Exam Exam conducted with a chaperone present.  Constitutional:      Appearance: Normal  appearance.  Cardiovascular:     Rate and Rhythm: Normal rate and regular rhythm.     Pulses: Normal pulses.     Heart sounds: Normal heart sounds.  Pulmonary:     Effort: Pulmonary effort is normal.     Breath sounds: Normal breath sounds.  Chest:    Musculoskeletal:     Cervical back: Neck supple.  Lymphadenopathy:     Upper Body:     Left upper body: No supraclavicular or axillary adenopathy.  Skin:    General: Skin is warm and dry.  Neurological:     Mental Status: She is alert and oriented to person, place, and time.  Psychiatric:        Mood and Affect: Mood normal.        Behavior: Behavior normal.           Assessment:     Candidate for breast conservation with unifocal invasive lobular carcinoma.    Plan:     The patient was advised that the superficial or skin margin is closest, and intraoperative assessment will be obtained.  If there is involvement, it may be necessary to sacrifice the nipple areolar complex.  If intraoperative exam is negative but final pathology is positive for an anterior margin, she may require delayed resection of that structure.        This note is partially prepared by Ledell Noss, CMA acting as a scribe in the presence of Dr. Hervey Ard, MD.    The documentation recorded by the scribe accurately reflects the service I personally performed and the decisions made by me.    Robert Bellow, MD FACS

## 2022-01-17 NOTE — Progress Notes (Signed)
Met patient and her husband today during her initial medical oncology consult with Dr. Rogue Bussing.  Gave patient breast cancer educational literature, "My Breast Cancer Treatment Handbook" by Josephine Igo, RN.   ER/PR and Her2 are still pending.  Patient is scheduled for surgery on 01/24/22.  Dr. Rogue Bussing discussed genetic testing after surgery.  She is agreeable.  She was encouraged to call with any questions or needs.

## 2022-01-17 NOTE — Research (Signed)
[employer] [phone] Protocol:   Dr. Rogue Bussing spoke with this patient during his consult appointment today. He explained and discussed the study with her briefly. The patient has declined participation at this time.  Jeral Fruit, RN 01/17/22 12:39 PM

## 2022-01-20 ENCOUNTER — Other Ambulatory Visit: Payer: Self-pay

## 2022-01-20 ENCOUNTER — Encounter
Admission: RE | Admit: 2022-01-20 | Discharge: 2022-01-20 | Disposition: A | Discharge status: Home / Self Care | Payer: BC Managed Care – PPO | Source: Ambulatory Visit | Attending: General Surgery | Admitting: General Surgery

## 2022-01-20 NOTE — Patient Instructions (Signed)
Your procedure is scheduled on: 01/24/22 Report to [facility] AT 8:15 AM  Remember: Instructions that are not followed completely may result in serious medical risk, up to and including death, or upon the discretion of your surgeon and anesthesiologist your surgery may need to be rescheduled.     _X__ 1. Do not eat food after midnight the night before your procedure.                 No gum chewing or hard candies. You may drink clear liquids up to 2 hours                 before you are scheduled to arrive for your surgery- DO not drink clear                 liquids within 2 hours of the start of your surgery.                 Clear Liquids include:  water, apple juice without pulp, clear carbohydrate                 drink such as Clearfast or Gatorade, Black Coffee or Tea (Do not add                 anything to coffee or tea). Diabetics water only  __X__2.  On the morning of surgery brush your teeth with toothpaste and water, you                 may rinse your mouth with mouthwash if you wish.  Do not swallow any              toothpaste of mouthwash.     _X__ 3.  No Alcohol for 24 hours before or after surgery.   _X__ 4.  Do Not Smoke or use e-cigarettes For 24 Hours Prior to Your Surgery.                 Do not use any chewable tobacco products for at least 6 hours prior to                 surgery.  ____  5.  Bring all medications with you on the day of surgery if instructed.   __X__  6.  Notify your doctor if there is any change in your medical condition      (cold, fever, infections).     Do not wear jewelry, make-up, hairpins, clips or nail polish. Do not wear lotions, powders, or perfumes. NO DEODORANT Do not shave BODY HAIR 48 hours prior to surgery. Men may shave face and neck. Do not bring valuables to the hospital.    [facility] is not responsible for any belongings or valuables.  Contacts, dentures/partials or body piercings may not be worn into  surgery. Bring a case for your contacts, glasses or hearing aids, a denture cup will be supplied. Leave your suitcase in the car. After surgery it may be brought to your room. For patients admitted to the hospital, discharge time is determined by your treatment team.   Patients discharged the day of surgery will not be allowed to drive home.   Please read over the following fact sheets that you were given:   CHG SOAP  __X__ Take these medicines the morning of surgery with A SIP OF WATER:    1. levothyroxine (SYNTHROID) 50 MCG tablet  2.   3.  4.  5.  6.  ____ Fleet Enema (as directed)   __X__ Use CHG Soap/SAGE wipes as directed  ____ Use inhalers on the day of surgery  ____ Stop metformin/Janumet/Farxiga 2 days prior to surgery    ____ Take 1/2 of usual insulin dose the night before surgery. No insulin the morning          of surgery.   ____ Stop Blood Thinners Coumadin/Plavix/Xarelto/Pleta/Pradaxa/Eliquis/Effient/Aspirin  on   Or contact your Surgeon, Cardiologist or Medical Doctor regarding  ability to stop your blood thinners  __X__ Stop Anti-inflammatories 7 days before surgery such as Advil, Ibuprofen, Motrin,  BC or Goodies Powder, Naprosyn, Naproxen, Aleve, Aspirin   MAY TAKE TYLENOL IF NEEDED  __X__ Stop all herbals and supplements, fish oil or vitamins  until after surgery.    ____ Bring C-Pap to the hospital.

## 2022-01-22 ENCOUNTER — Other Ambulatory Visit: Payer: Self-pay | Admitting: General Surgery

## 2022-01-22 DIAGNOSIS — C50912 Malignant neoplasm of unspecified site of left female breast: Secondary | ICD-10-CM

## 2022-01-24 ENCOUNTER — Other Ambulatory Visit: Payer: Self-pay

## 2022-01-24 ENCOUNTER — Ambulatory Visit
Admission: RE | Admit: 2022-01-24 | Discharge: 2022-01-24 | Disposition: A | Discharge status: Home / Self Care | Payer: BC Managed Care – PPO | Source: Ambulatory Visit | Attending: General Surgery | Admitting: General Surgery

## 2022-01-24 ENCOUNTER — Ambulatory Visit
Admission: RE | Admit: 2022-01-24 | Discharge: 2022-01-24 | Disposition: A | Discharge status: Home / Self Care | Payer: BC Managed Care – PPO | Attending: General Surgery | Admitting: General Surgery

## 2022-01-24 ENCOUNTER — Encounter
Admission: RE | Disposition: A | Discharge status: Home / Self Care | Payer: Self-pay | Source: Home / Self Care | Attending: General Surgery

## 2022-01-24 ENCOUNTER — Encounter: Payer: Self-pay | Admitting: General Surgery

## 2022-01-24 ENCOUNTER — Ambulatory Visit: Payer: BC Managed Care – PPO | Admitting: Anesthesiology

## 2022-01-24 DIAGNOSIS — L409 Psoriasis, unspecified: Secondary | ICD-10-CM | POA: Diagnosis not present

## 2022-01-24 DIAGNOSIS — E559 Vitamin D deficiency, unspecified: Secondary | ICD-10-CM | POA: Diagnosis not present

## 2022-01-24 DIAGNOSIS — C50912 Malignant neoplasm of unspecified site of left female breast: Secondary | ICD-10-CM

## 2022-01-24 DIAGNOSIS — E785 Hyperlipidemia, unspecified: Secondary | ICD-10-CM | POA: Diagnosis not present

## 2022-01-24 DIAGNOSIS — Z170 Estrogen receptor positive status [ER+]: Secondary | ICD-10-CM | POA: Insufficient documentation

## 2022-01-24 HISTORY — PX: BREAST LUMPECTOMY: SHX2

## 2022-01-24 HISTORY — PX: BREAST LUMPECTOMY WITH SENTINEL LYMPH NODE BIOPSY: SHX5597

## 2022-01-24 SURGERY — BREAST LUMPECTOMY WITH SENTINEL LYMPH NODE BX
Anesthesia: General | Laterality: Left

## 2022-01-24 MED ORDER — ACETAMINOPHEN 10 MG/ML IV SOLN
INTRAVENOUS | Status: DC | PRN
Start: 2022-01-24 — End: 2022-01-24
  Administered 2022-01-24: 1000 mg via INTRAVENOUS

## 2022-01-24 MED ORDER — CHLORHEXIDINE GLUCONATE 0.12 % MT SOLN
OROMUCOSAL | Status: AC
  Administered 2022-01-24: 15 mL via OROMUCOSAL
  Filled 2022-01-24: qty 15

## 2022-01-24 MED ORDER — OXYCODONE HCL 5 MG PO TABS
5.0000 mg | ORAL_TABLET | Freq: Once | ORAL | Status: AC | PRN
  Administered 2022-01-24: 5 mg via ORAL

## 2022-01-24 MED ORDER — BUPIVACAINE-EPINEPHRINE (PF) 0.5% -1:200000 IJ SOLN
INTRAMUSCULAR | Status: AC
  Filled 2022-01-24: qty 30

## 2022-01-24 MED ORDER — FENTANYL CITRATE (PF) 100 MCG/2ML IJ SOLN: 25.0000 ug | INTRAMUSCULAR | Status: DC | PRN

## 2022-01-24 MED ORDER — OXYCODONE HCL 5 MG/5ML PO SOLN: 5.0000 mg | Freq: Once | ORAL | Status: AC | PRN

## 2022-01-24 MED ORDER — PROPOFOL 10 MG/ML IV BOLUS
INTRAVENOUS | Status: DC | PRN
  Administered 2022-01-24: 150 mg via INTRAVENOUS

## 2022-01-24 MED ORDER — LACTATED RINGERS IV SOLN: INTRAVENOUS | Status: DC

## 2022-01-24 MED ORDER — FENTANYL CITRATE (PF) 100 MCG/2ML IJ SOLN
INTRAMUSCULAR | Status: AC
  Filled 2022-01-24: qty 2

## 2022-01-24 MED ORDER — CHLORHEXIDINE GLUCONATE 0.12 % MT SOLN: 15.0000 mL | Freq: Once | OROMUCOSAL | Status: AC

## 2022-01-24 MED ORDER — HYDROCODONE-ACETAMINOPHEN 5-325 MG PO TABS: 1.0000 | ORAL_TABLET | ORAL | 0 refills | Status: DC | PRN

## 2022-01-24 MED ORDER — ONDANSETRON HCL 4 MG/2ML IJ SOLN
INTRAMUSCULAR | Status: DC | PRN
  Administered 2022-01-24: 4 mg via INTRAVENOUS

## 2022-01-24 MED ORDER — PROPOFOL 10 MG/ML IV BOLUS
INTRAVENOUS | Status: AC
  Filled 2022-01-24: qty 20

## 2022-01-24 MED ORDER — DEXAMETHASONE SODIUM PHOSPHATE 10 MG/ML IJ SOLN
INTRAMUSCULAR | Status: DC | PRN
  Administered 2022-01-24: 5 mg via INTRAVENOUS

## 2022-01-24 MED ORDER — CHLORHEXIDINE GLUCONATE CLOTH 2 % EX PADS: 6.0000 | MEDICATED_PAD | Freq: Once | CUTANEOUS | Status: DC

## 2022-01-24 MED ORDER — FENTANYL CITRATE (PF) 100 MCG/2ML IJ SOLN
INTRAMUSCULAR | Status: DC | PRN
  Administered 2022-01-24: 25 ug via INTRAVENOUS
  Administered 2022-01-24 (×2): 50 ug via INTRAVENOUS
  Administered 2022-01-24: 25 ug via INTRAVENOUS

## 2022-01-24 MED ORDER — DEXAMETHASONE SODIUM PHOSPHATE 10 MG/ML IJ SOLN
INTRAMUSCULAR | Status: AC
  Filled 2022-01-24: qty 1

## 2022-01-24 MED ORDER — OXYCODONE HCL 5 MG PO TABS
ORAL_TABLET | ORAL | Status: AC
  Filled 2022-01-24: qty 1

## 2022-01-24 MED ORDER — LIDOCAINE HCL (CARDIAC) PF 100 MG/5ML IV SOSY
PREFILLED_SYRINGE | INTRAVENOUS | Status: DC | PRN
  Administered 2022-01-24: 100 mg via INTRAVENOUS

## 2022-01-24 MED ORDER — STERILE WATER FOR IRRIGATION IR SOLN
Status: DC | PRN
  Administered 2022-01-24: 1000 mL

## 2022-01-24 MED ORDER — TECHNETIUM TC 99M TILMANOCEPT KIT
1.0000 | PACK | Freq: Once | INTRAVENOUS | Status: AC | PRN
  Administered 2022-01-24: 1.1 via INTRADERMAL

## 2022-01-24 MED ORDER — ORAL CARE MOUTH RINSE: 15.0000 mL | Freq: Once | OROMUCOSAL | Status: AC

## 2022-01-24 MED ORDER — MIDAZOLAM HCL 2 MG/2ML IJ SOLN
INTRAMUSCULAR | Status: AC
  Filled 2022-01-24: qty 2

## 2022-01-24 MED ORDER — ACETAMINOPHEN 10 MG/ML IV SOLN
INTRAVENOUS | Status: AC
  Filled 2022-01-24: qty 100

## 2022-01-24 MED ORDER — FAMOTIDINE 20 MG PO TABS: 20.0000 mg | ORAL_TABLET | Freq: Once | ORAL | Status: AC

## 2022-01-24 MED ORDER — FAMOTIDINE 20 MG PO TABS
ORAL_TABLET | ORAL | Status: AC
  Administered 2022-01-24: 20 mg via ORAL
  Filled 2022-01-24: qty 1

## 2022-01-24 MED ORDER — METHYLENE BLUE 0.5 % INJ SOLN
INTRAVENOUS | Status: AC
  Filled 2022-01-24: qty 10

## 2022-01-24 MED ORDER — BUPIVACAINE-EPINEPHRINE (PF) 0.5% -1:200000 IJ SOLN
INTRAMUSCULAR | Status: DC | PRN
  Administered 2022-01-24: 20 mL via PERINEURAL
  Administered 2022-01-24: 10 mL via PERINEURAL

## 2022-01-24 MED ORDER — MIDAZOLAM HCL 2 MG/2ML IJ SOLN
INTRAMUSCULAR | Status: DC | PRN
Start: 2022-01-24 — End: 2022-01-24
  Administered 2022-01-24: 2 mg via INTRAVENOUS

## 2022-01-24 MED ORDER — KETOROLAC TROMETHAMINE 30 MG/ML IJ SOLN
INTRAMUSCULAR | Status: DC | PRN
  Administered 2022-01-24: 30 mg via INTRAVENOUS

## 2022-01-24 MED ORDER — ONDANSETRON HCL 4 MG/2ML IJ SOLN
INTRAMUSCULAR | Status: AC
  Filled 2022-01-24: qty 2

## 2022-01-24 SURGICAL SUPPLY — 61 items
APL PRP STRL LF DISP 70% ISPRP (MISCELLANEOUS) ×1
BINDER BREAST MEDIUM (GAUZE/BANDAGES/DRESSINGS) ×1 IMPLANT
BLADE BOVIE TIP EXT 4 (BLADE) IMPLANT
BLADE SURG 15 STRL SS SAFETY (BLADE) ×4 IMPLANT
BULB RESERV EVAC DRAIN JP 100C (MISCELLANEOUS) IMPLANT
CHLORAPREP W/TINT 26 (MISCELLANEOUS) ×2 IMPLANT
CNTNR SPEC 2.5X3XGRAD LEK (MISCELLANEOUS)
CONT SPEC 4OZ STER OR WHT (MISCELLANEOUS)
CONT SPEC 4OZ STRL OR WHT (MISCELLANEOUS)
CONTAINER SPEC 2.5X3XGRAD LEK (MISCELLANEOUS) IMPLANT
COVER PROBE FLX POLY STRL (MISCELLANEOUS) ×2 IMPLANT
DEVICE DUBIN SPECIMEN MAMMOGRA (MISCELLANEOUS) ×2 IMPLANT
DRAIN CHANNEL JP 15F RND 16 (MISCELLANEOUS) IMPLANT
DRAPE LAPAROTOMY TRNSV 106X77 (MISCELLANEOUS) ×2 IMPLANT
DRSG GAUZE FLUFF 36X18 (GAUZE/BANDAGES/DRESSINGS) ×4 IMPLANT
DRSG TELFA 3X8 NADH (GAUZE/BANDAGES/DRESSINGS) ×2 IMPLANT
ELECT CAUTERY BLADE TIP 2.5 (TIP) ×2
ELECT REM PT RETURN 9FT ADLT (ELECTROSURGICAL) ×2
ELECTRODE CAUTERY BLDE TIP 2.5 (TIP) ×1 IMPLANT
ELECTRODE REM PT RTRN 9FT ADLT (ELECTROSURGICAL) ×1 IMPLANT
GAUZE 4X4 16PLY ~~LOC~~+RFID DBL (SPONGE) ×2 IMPLANT
GLOVE SURG ENC MOIS LTX SZ7.5 (GLOVE) ×2 IMPLANT
GLOVE SURG UNDER LTX SZ8 (GLOVE) ×2 IMPLANT
GOWN STRL REUS W/ TWL LRG LVL3 (GOWN DISPOSABLE) ×2 IMPLANT
GOWN STRL REUS W/TWL LRG LVL3 (GOWN DISPOSABLE) ×4
KIT TURNOVER KIT A (KITS) ×2 IMPLANT
LABEL OR SOLS (LABEL) ×2 IMPLANT
MANIFOLD NEPTUNE II (INSTRUMENTS) ×2 IMPLANT
MARGIN MAP 10MM (MISCELLANEOUS) ×2 IMPLANT
NDL HYPO 25X1 1.5 SAFETY (NEEDLE) ×2 IMPLANT
NDL SPNL 20GX3.5 QUINCKE YW (NEEDLE) IMPLANT
NEEDLE HYPO 22GX1.5 SAFETY (NEEDLE) ×2 IMPLANT
NEEDLE HYPO 25X1 1.5 SAFETY (NEEDLE) ×4 IMPLANT
NEEDLE SPNL 20GX3.5 QUINCKE YW (NEEDLE) IMPLANT
PACK BASIN MINOR ARMC (MISCELLANEOUS) ×2 IMPLANT
PAD DRESSING TELFA 3X8 NADH (GAUZE/BANDAGES/DRESSINGS) ×1 IMPLANT
PENCIL ELECTRO HAND CTR (MISCELLANEOUS) ×1 IMPLANT
RETRACTOR RING XSMALL (MISCELLANEOUS) IMPLANT
RTRCTR WOUND ALEXIS 13CM XS SH (MISCELLANEOUS)
SHEARS FOC LG CVD HARMONIC 17C (MISCELLANEOUS) IMPLANT
SHEARS HARMONIC 9CM CVD (BLADE) IMPLANT
SLEVE PROBE SENORX GAMMA FIND (MISCELLANEOUS) IMPLANT
STRIP CLOSURE SKIN 1/2X4 (GAUZE/BANDAGES/DRESSINGS) ×2 IMPLANT
SUT ETHILON 3-0 FS-10 30 BLK (SUTURE) ×2
SUT SILK 2 0 (SUTURE) ×2
SUT SILK 2-0 18XBRD TIE 12 (SUTURE) ×1 IMPLANT
SUT VIC AB 2-0 CT1 27 (SUTURE) ×4
SUT VIC AB 2-0 CT1 TAPERPNT 27 (SUTURE) ×2 IMPLANT
SUT VIC AB 3-0 54X BRD REEL (SUTURE) ×1 IMPLANT
SUT VIC AB 3-0 BRD 54 (SUTURE) ×2
SUT VIC AB 3-0 SH 27 (SUTURE) ×4
SUT VIC AB 3-0 SH 27X BRD (SUTURE) ×2 IMPLANT
SUT VIC AB 4-0 FS2 27 (SUTURE) ×4 IMPLANT
SUTURE EHLN 3-0 FS-10 30 BLK (SUTURE) ×1 IMPLANT
SWABSTK COMLB BENZOIN TINCTURE (MISCELLANEOUS) ×2 IMPLANT
SYR 10ML LL (SYRINGE) ×2 IMPLANT
SYR BULB IRRIG 60ML STRL (SYRINGE) ×2 IMPLANT
TAPE TRANSPORE STRL 2 31045 (GAUZE/BANDAGES/DRESSINGS) ×1 IMPLANT
TRAP NEPTUNE SPECIMEN COLLECT (MISCELLANEOUS) ×1 IMPLANT
WATER STERILE IRR 1000ML POUR (IV SOLUTION) ×2 IMPLANT
WATER STERILE IRR 500ML POUR (IV SOLUTION) ×2 IMPLANT

## 2022-01-24 NOTE — Discharge Instructions (Signed)

## 2022-01-24 NOTE — Transfer of Care (Signed)
Immediate Anesthesia Transfer of Care Note  Patient: Tricia Rivera  Procedure(s) Performed: BREAST LUMPECTOMY WITH SENTINEL AXIILLARY LYMPH NODE BX (Left)  Patient Location: PACU  Anesthesia Type:General  Level of Consciousness: awake and alert   Airway & Oxygen Therapy: Patient Spontanous Breathing  Post-op Assessment: Report given to RN and Post -op Vital signs reviewed and stable  Post vital signs: Reviewed and stable  Last Vitals:  Vitals Value Taken Time  BP 132/67 01/24/22 1111  Temp 36.5 C 01/24/22 1111  Pulse 91 01/24/22 1112  Resp 14 01/24/22 1112  SpO2 97 % 01/24/22 1112  Vitals shown include unvalidated device data.  Last Pain:  Vitals:   01/24/22 1111  TempSrc:   PainSc: 0-No pain         Complications: No notable events documented.

## 2022-01-24 NOTE — Anesthesia Procedure Notes (Signed)
Procedure Name: LMA Insertion Date/Time: 01/24/2022 9:40 AM Performed by: Hedda Slade, CRNA Pre-anesthesia Checklist: Patient identified, Patient being monitored, Timeout performed, Emergency Drugs available and Suction available Patient Re-evaluated:Patient Re-evaluated prior to induction Oxygen Delivery Method: Circle system utilized Preoxygenation: Pre-oxygenation with 100% oxygen Induction Type: IV induction Ventilation: Mask ventilation without difficulty LMA: LMA inserted LMA Size: 3.5 Tube type: Oral Number of attempts: 1 Placement Confirmation: positive ETCO2 and breath sounds checked- equal and bilateral Tube secured with: Tape Dental Injury: Teeth and Oropharynx as per pre-operative assessment

## 2022-01-24 NOTE — Op Note (Signed)
Preoperative diagnosis: Invasive lobular carcinoma of the left breast.  Postoperative diagnosis: Same.  Procedure: Left breast wide excision with ultrasound guidance, tissue transfer, sentinel node biopsy.  Operating surgeon: Hervey Ard, MD.  Anesthesia: General by LMA, Marcaine 0.5% with 1: 200,000 units of epinephrine, pectoralis block, local infiltration.  Estimated blood loss: 5 cc.  Clinical note: This 61 year old woman was recently identified with a new retroareolar mass in the left breast.  Core biopsy showed evidence of invasive mammary carcinoma with lobular features.  Preoperative MRI showed a isolated 1.9 cm area corresponding to the mammographic pathology.  She desired breast conservation.  She underwent injection with technetium sulfur colloid the morning of the procedure and tolerated this well.  Antibiotics were not indicated.  SCD stockings for DVT prevention.  Operative note: With the patient under adequate general anesthesia in the breast chest and axilla was cleansed with ChloraPrep and draped.  Ultrasound was used to identify the area of the primary tumor.  Image placed in the chart for permanent record.  Local anesthesia was infiltrated well away from the areola and then as a pectoralis block in the axilla.  A circumareolar incision from the 6 to 12 o'clock position was made.  The skin was incised sharply and then the remaining dissection completed with electrocautery.  Based on the ultrasound images the tumor was about a centimeter below the skin surface.  A thick flap underneath the areola was maintained and a 2-1/2 x 2 and half by 2 and half centimeter block of tissue was excised orientated and specimen radiograph completed.  This showed the previously placed biopsy clip towards the superior aspect of the specimen.  The specimen was sent to pathology for review and subsequent report showed no gross positive disease.  While the breast specimen was being processed  attention was turned to the axilla.  The node seeker device was utilized to identify the area of increased uptake in the base of the axilla.  Local anesthesia was infiltrated followed by a transverse skin line incision.  This was deepened down to and then through the axillary envelope.  1 node with counts of about 2500, second with counts of 300-1/3 with counts of about 400 were then identified.  There was a pad of adipose tissue that was sent for routine histology as it was in between the 2 groups of nodes in made it difficult to visualize the highest node.  Good hemostasis was noted.  The axillary envelope was closed with interrupted 2-0 Vicryl suture as was the adipose layer.  The skin was closed with a running 4-0 Vicryl subcuticular suture  Attention was turned to closing the breast defect.  The breast was elevated circumferentially off the underlying pectoralis muscle.  This was then approximated to reconstruct the volume behind the nipple areolar complex.  This was done in 2 layers with 2-0 Vicryl figure-of-eight sutures.  The adipose layer was then approximated with interrupted 2-0 Vicryl sutures.  The skin was closed with interrupted 4-0 Vicryl subcuticular sutures.  Benzoin and Steri-Strips followed by Telfa, taking care not to compress the nipple, were applied followed by fluff gauze and a compressive wrap.  Patient tolerated the procedure well and was taken to the PACU in stable condition.

## 2022-01-24 NOTE — Anesthesia Preprocedure Evaluation (Signed)
Anesthesia Evaluation  Patient identified by MRN, date of birth, ID band Patient awake    Reviewed: Allergy & Precautions, NPO status , Patient's Chart, lab work & pertinent test results  History of Anesthesia Complications Negative for: history of anesthetic complications  Airway Mallampati: III  TM Distance: <3 FB Neck ROM: full    Dental  (+) Chipped, Poor Dentition   Pulmonary neg shortness of breath,    Pulmonary exam normal        Cardiovascular Exercise Tolerance: Good (-) angina(-) Past MI and (-) DOE negative cardio ROS Normal cardiovascular exam     Neuro/Psych PSYCHIATRIC DISORDERS negative neurological ROS     GI/Hepatic negative GI ROS, Neg liver ROS,   Endo/Other  negative endocrine ROS  Renal/GU      Musculoskeletal   Abdominal   Peds  Hematology negative hematology ROS (+)   Anesthesia Other Findings Past Medical History: No date: ADD (attention deficit disorder) No date: Anxiety No date: Depression No date: Hyperlipidemia No date: Malignant neoplasm of left breast (HCC) No date: Psoriasis No date: Vitamin D deficiency  Past Surgical History: 01/08/2022: BREAST BIOPSY; Left     Comment:  [place] bs, venus marker, path pending 12/01/2008: BUNIONECTOMY No date: BUNIONECTOMY; N/A     Comment:  2015 No date: COLONOSCOPY     Reproductive/Obstetrics negative OB ROS                             Anesthesia Physical Anesthesia Plan  ASA: 2  Anesthesia Plan: General LMA   Post-op Pain Management:    Induction: Intravenous  PONV Risk Score and Plan: Dexamethasone, Ondansetron, Midazolam and Treatment may vary due to age or medical condition  Airway Management Planned: LMA  Additional Equipment:   Intra-op Plan:   Post-operative Plan: Extubation in OR  Informed Consent: I have reviewed the patients History and Physical, chart, labs and discussed the procedure  including the risks, benefits and alternatives for the proposed anesthesia with the patient or authorized representative who has indicated his/her understanding and acceptance.     Dental Advisory Given  Plan Discussed with: Anesthesiologist, CRNA and Surgeon  Anesthesia Plan Comments: (Patient consented for risks of anesthesia including but not limited to:  - adverse reactions to medications - damage to eyes, teeth, lips or other oral mucosa - nerve damage due to positioning  - sore throat or hoarseness - Damage to heart, brain, nerves, lungs, other parts of body or loss of life  Patient voiced understanding.)        Anesthesia Quick Evaluation

## 2022-01-24 NOTE — Anesthesia Postprocedure Evaluation (Signed)
Anesthesia Post Note  Patient: Tricia Rivera  Procedure(s) Performed: BREAST LUMPECTOMY WITH SENTINEL AXIILLARY LYMPH NODE BX (Left)  Patient location during evaluation: PACU Anesthesia Type: General Level of consciousness: awake and alert Pain management: pain level controlled Vital Signs Assessment: post-procedure vital signs reviewed and stable Respiratory status: spontaneous breathing, nonlabored ventilation, respiratory function stable and patient connected to nasal cannula oxygen Cardiovascular status: blood pressure returned to baseline and stable Postop Assessment: no apparent nausea or vomiting Anesthetic complications: no   No notable events documented.   Last Vitals:  Vitals:   01/24/22 1145 01/24/22 1152  BP: 130/80 132/69  Pulse: 87 80  Resp: 16 18  Temp:  (!) 36.2 C  SpO2: 92% 98%    Last Pain:  Vitals:   01/24/22 1152  TempSrc: Tympanic  PainSc: 0-No pain                 [name]

## 2022-01-24 NOTE — H&P (Signed)
Tricia Rivera [id number] [birth year]     HPI: Healthy 61 y/o with recently identified left breast cancer. Has decided on breast conservation.  Tolerated SLN injection well.   Medications Prior to Admission  Medication Sig Dispense Refill Last Dose   levothyroxine (SYNTHROID) 50 MCG tablet TAKE 1 TABLET BY MOUTH EVERY DAY 90 tablet 0 01/24/2022   lidocaine-prilocaine (EMLA) cream SMARTSIG:1 Topical Daily   Past Month   Vitamin D, Ergocalciferol, (DRISDOL) 1.25 MG (50000 UNIT) CAPS capsule Take 1 capsule (50,000 Units total) by mouth every 7 (seven) days. 12 capsule 1 01/22/2022   ZORYVE 0.3 % CREA Apply 1 application topically daily as needed (Psoriasis).   Past Month   triamcinolone cream (KENALOG) 0.1 % APPLY TWICE A DAY TO AFFECTED AREAS (Patient taking differently: Apply 1 application topically 2 (two) times daily as needed. APPLY TWICE A DAY TO AFFECTED AREAS) 30 g 6 Not Taking   No Known Allergies Past Medical History:  Diagnosis Date   ADD (attention deficit disorder)    Anxiety    Depression    Hyperlipidemia    Malignant neoplasm of left breast (HCC)    Psoriasis    Vitamin D deficiency    Past Surgical History:  Procedure Laterality Date   BREAST BIOPSY Left 01/08/2022   [place] bs, venus marker, path pending   BUNIONECTOMY  12/01/2008   BUNIONECTOMY N/A    2015   COLONOSCOPY     Social History   Socioeconomic History   Marital status: Married    Spouse name: Not on file   Number of children: Not on file   Years of education: Not on file   Highest education level: Not on file  Occupational History   Not on file  Tobacco Use   Smoking status: Never   Smokeless tobacco: Never  Vaping Use   Vaping Use: Never used  Substance and Sexual Activity   Alcohol use: Not Currently    Alcohol/week: 3.0 standard drinks    Types: 3 Glasses of wine per week   Drug use: No   Sexual activity: Not Currently  Other Topics Concern   Not on file  Social History Narrative    Lives in [place]; with husband; son' grandson. Daughter- in [place]. 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    Social Determinants of Health   Financial Resource Strain: Not on file  Food Insecurity: Not on file  Transportation Needs: Not on file  Physical Activity: Not on file  Stress: Not on file  Social Connections: Not on file  Intimate Partner Violence: Not on file   Social History   Social History Narrative   Lives in [place]; with husband; son' grandson. Daughter- in [place]. 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      ROS: Negative.     PE: HEENT: Negative. Lungs: Clear. Cardio: RR.  Assessment/Plan:  Proceed with planned left breast wide excision and SLN exam.    Forest Gleason [facility] 01/24/2022

## 2022-01-25 ENCOUNTER — Encounter: Payer: Self-pay | Admitting: General Surgery

## 2022-01-27 ENCOUNTER — Encounter: Payer: Self-pay | Admitting: *Deleted

## 2022-01-27 LAB — SURGICAL PATHOLOGY

## 2022-01-27 NOTE — Progress Notes (Signed)
Returned patient's call.  Patient had questions about her Her2 status and her follow up appointment with Dr. Bary Castilla.  Informed patient that her Her2 is negative and she has an appointment with the nurse on 01/30/22 @ 9:00

## 2022-01-28 ENCOUNTER — Other Ambulatory Visit: Payer: Self-pay | Admitting: Anatomic Pathology & Clinical Pathology

## 2022-01-28 ENCOUNTER — Encounter: Payer: Self-pay | Admitting: General Surgery

## 2022-01-28 LAB — SURGICAL PATHOLOGY

## 2022-02-07 ENCOUNTER — Encounter: Payer: Self-pay | Admitting: Internal Medicine

## 2022-02-07 ENCOUNTER — Inpatient Hospital Stay: Payer: BC Managed Care – PPO | Attending: Internal Medicine | Admitting: Internal Medicine

## 2022-02-07 ENCOUNTER — Telehealth: Payer: Self-pay

## 2022-02-07 ENCOUNTER — Other Ambulatory Visit: Payer: Self-pay

## 2022-02-07 DIAGNOSIS — Z170 Estrogen receptor positive status [ER+]: Secondary | ICD-10-CM | POA: Insufficient documentation

## 2022-02-07 DIAGNOSIS — C50812 Malignant neoplasm of overlapping sites of left female breast: Secondary | ICD-10-CM | POA: Diagnosis not present

## 2022-02-07 NOTE — Assessment & Plan Note (Addendum)
#  Invasive lobular breast cancere ER;PR Positive; her 2 Negative; stage IA. ? ?# #Discussed the role of adjuvant chemotherapy/endocrine therapy.  Recommend Oncotype testing.  Discussed the prognostic/predictive rational for the testing in detail. .  We will make decisions on chemotherapy based on the results of the Oncotype testing. Patient agreement.  We will order Oncotype testing today.  However low clinical likelihood the patient will need chemotherapy.  ? ?# #Discussed the mechanism of action of aromatase inhibitors-with blocking of estrogen to prevent breast cancer.  Also discussed the potential side effects including but not limited to arthralgias hot flashes and increased risk of osteoporosis.  We will check bone density next visit.  We will start this after radiation. ? ?#We will make a referral to Dr. Donella Stade for consideration of radiation postlumpectomy.  Patient understands that if patient needs chemotherapy [it is again less likely]-she will need this prior to radiation.  ? ?# Genetics: Given the family history of malignancies recommend evaluation with genetics.  However low clinical suspicion for any genetic predisposition to malignancy; awaiting on 3/14.  Again discussed the rationale with the patient at length.  ? ?Will call re: oncotype results ?# DISPOSITION:  ?# refer to Dr.Chrystal re: Breast cancer ?# follow up in 2 months; MD: no labs-- Dr.B ?

## 2022-02-07 NOTE — Progress Notes (Signed)
Patient denies new problems/concerns today.   °

## 2022-02-07 NOTE — Progress Notes (Signed)
[facility] CONSULT NOTE  Patient Care Team: [name], PA-C as PCP - General (Physician Assistant) Theodore Demark, RN as Oncology Nurse Navigator Cammie Sickle, MD as Consulting Physician (Oncology) Bary Castilla Forest Gleason, MD as Consulting Physician (General Surgery)  CHIEF COMPLAINTS/PURPOSE OF CONSULTATION: Breast cancer    Oncology History Overview Note  #Invasive lobular breast cancere ER;PR Positive; her 2 Negative; stage IA. mT [84m]; pN0; s/p lumpectomy; Oncotype -;   # IMPRESSION: 1. A 6 mm mass in the LEFT inner breast at 9 o'clock 1 cm from the nipple with associated architectural distortion is indeterminate. Recommend ultrasound-guided biopsy for definitive characterization with attention on post marker placement mammogram to assess for mammographic/sonographic correlation. 2. No suspicious LEFT axillary adenopathy.  DIAGNOSIS:  A.  BREAST, LEFT 9:00 1 CM FN; ULTRASOUND-GUIDED BIOPSY:  - INVASIVE MAMMARY CARCINOMA WITH LOBULAR FEATURES.  - LOBULAR NEOPLASIA.   Size of invasive carcinoma: 8 mm in this sample  Histologic grade of invasive carcinoma: Grade 2                       Glandular/tubular differentiation score: 3                       Nuclear pleomorphism score: 2                       Mitotic rate score: 1                       Total score: 6  Ductal carcinoma in situ: Not identified  Lymphovascular invasion: Not identified   ER/PR/HER2: Immunohistochemistry will be performed on block A1, with  reflex to [place] HER2 2+. The results will be reported in an addendum.   Comment:  The definitive grade will be assigned on the excisional specimen.    Carcinoma of overlapping sites of left breast in female, estrogen receptor positive ([facility]  02/07/2022 Initial Diagnosis   Carcinoma of overlapping sites of left breast in female, estrogen receptor positive ([facility]   02/07/2022 Cancer Staging   Staging form: Breast, AJCC 8th Edition -  Pathologic: Stage IA (pT1c, pN0, cM0, G2, ER+, PR+, HER2-) - Signed by BCammie Sickle MD on 02/07/2022 Histologic grading system: 3 grade system       HISTORY OF PRESENTING ILLNESS: Patient ambulating independently.  Accompanied by husband   PMende Biswell61y.o.  female with a history of early stage breast cancer is here to review the results of her surgery; and the plan of care.  Patient denies any postoperative complications.  She is healing well.  No nausea no vomiting.    Review of Systems  Constitutional:  Negative for chills, diaphoresis, fever, malaise/fatigue and weight loss.  HENT:  Negative for nosebleeds and sore throat.   Eyes:  Negative for double vision.  Respiratory:  Negative for cough, hemoptysis, sputum production, shortness of breath and wheezing.   Cardiovascular:  Negative for chest pain, palpitations, orthopnea and leg swelling.  Gastrointestinal:  Negative for abdominal pain, blood in stool, constipation, diarrhea, heartburn, melena, nausea and vomiting.  Genitourinary:  Negative for dysuria, frequency and urgency.  Musculoskeletal:  Negative for back pain and joint pain.  Skin: Negative.  Negative for itching and rash.  Neurological:  Negative for dizziness, tingling, focal weakness, weakness and headaches.  Endo/Heme/Allergies:  Does not bruise/bleed easily.  Psychiatric/Behavioral:  Negative for depression. The  patient is not nervous/anxious and does not have insomnia.     MEDICAL HISTORY:  Past Medical History:  Diagnosis Date   ADD (attention deficit disorder)    Anxiety    Depression    Hyperlipidemia    Malignant neoplasm of left breast (HCC)    Psoriasis    Vitamin D deficiency     SURGICAL HISTORY: Past Surgical History:  Procedure Laterality Date   BREAST BIOPSY Left 01/08/2022   [place] bs, venus marker, path pending   BREAST LUMPECTOMY WITH SENTINEL LYMPH NODE BIOPSY Left 01/24/2022   Procedure: BREAST LUMPECTOMY WITH SENTINEL  AXIILLARY LYMPH NODE BX;  Surgeon: Robert Bellow, MD;  Location: [facility] ORS;  Service: General;  Laterality: Left;   BUNIONECTOMY  12/01/2008   BUNIONECTOMY N/A    2015   COLONOSCOPY      SOCIAL HISTORY: Social History   Socioeconomic History   Marital status: Married    Spouse name: Not on file   Number of children: Not on file   Years of education: Not on file   Highest education level: Not on file  Occupational History   Not on file  Tobacco Use   Smoking status: Never   Smokeless tobacco: Never  Vaping Use   Vaping Use: Never used  Substance and Sexual Activity   Alcohol use: Not Currently    Alcohol/week: 3.0 standard drinks    Types: 3 Glasses of wine per week   Drug use: No   Sexual activity: Not Currently  Other Topics Concern   Not on file  Social History Narrative   Lives in [place]; with husband; son' grandson. Daughter- in [place]. 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    Social Determinants of Health   Financial Resource Strain: Not on file  Food Insecurity: Not on file  Transportation Needs: Not on file  Physical Activity: Not on file  Stress: Not on file  Social Connections: Not on file  Intimate Partner Violence: Not on file    FAMILY HISTORY: Family History  Problem Relation Age of Onset   Pancreatic cancer Mother    Liver cancer Father    Breast cancer Maternal Aunt    Liver cancer Maternal Grandfather     ALLERGIES:  has No Known Allergies.  MEDICATIONS:  Current Outpatient Medications  Medication Sig Dispense Refill   levothyroxine (SYNTHROID) 50 MCG tablet TAKE 1 TABLET BY MOUTH EVERY DAY 90 tablet 0   triamcinolone cream (KENALOG) 0.1 % APPLY TWICE A DAY TO AFFECTED AREAS (Patient taking differently: Apply 1 application. topically 2 (two) times daily as needed. APPLY TWICE A DAY TO AFFECTED AREAS) 30 g 6   Vitamin D, Ergocalciferol, (DRISDOL) 1.25 MG (50000 UNIT) CAPS capsule Take 1 capsule (50,000 Units  total) by mouth every 7 (seven) days. 12 capsule 1   ZORYVE 0.3 % CREA Apply 1 application topically daily as needed (Psoriasis).     HYDROcodone-acetaminophen (NORCO/VICODIN) 5-325 MG tablet Take 1 tablet by mouth every 4 (four) hours as needed for moderate pain. 15 tablet 0   No current facility-administered medications for this visit.      Marland Kitchen  PHYSICAL EXAMINATION: ECOG PERFORMANCE STATUS: 0 - Asymptomatic  Vitals:   02/07/22 1500  BP: 133/79  Pulse: 72  Resp: 18  Temp: 98.4 F (36.9 C)   Filed Weights   02/07/22 1500  Weight: 134 lb 12.8 oz (61.1 kg)    Physical Exam Vitals and nursing note reviewed.  HENT:  Head: Normocephalic and atraumatic.     Mouth/Throat:     Pharynx: Oropharynx is clear.  Eyes:     Extraocular Movements: Extraocular movements intact.     Pupils: Pupils are equal, round, and reactive to light.  Cardiovascular:     Rate and Rhythm: Normal rate and regular rhythm.  Pulmonary:     Comments: Decreased breath sounds bilaterally.  Abdominal:     Palpations: Abdomen is soft.  Musculoskeletal:        General: Normal range of motion.     Cervical back: Normal range of motion.  Skin:    General: Skin is warm.  Neurological:     General: No focal deficit present.     Mental Status: She is alert and oriented to person, place, and time.  Psychiatric:        Behavior: Behavior normal.        Judgment: Judgment normal.    LABORATORY DATA:  I have reviewed the data as listed Lab Results  Component Value Date   WBC 4.8 11/19/2020   HGB 14.4 11/19/2020   HCT 39.2 11/19/2020   MCV 97 11/19/2020   PLT 265 11/19/2020   Recent Labs    11/21/21 0959  NA 141  K 4.5  CL 100  CO2 24  GLUCOSE 92  BUN 11  CREATININE 0.77  CALCIUM 9.9  PROT 7.5  ALBUMIN 4.9*  AST 45*  ALT 52*  ALKPHOS 90  BILITOT 0.5    RADIOGRAPHIC STUDIES: I have personally reviewed the radiological images as listed and agreed with the findings in the report. MR  BREAST BILATERAL W WO CONTRAST INC CAD  Result Date: 01/15/2022 CLINICAL DATA:  Biopsy proven invasive mammary carcinoma in the 9 o'clock region of the left breast. EXAM: BILATERAL BREAST MRI WITH AND WITHOUT CONTRAST TECHNIQUE: Multiplanar, multisequence MR images of both breasts were obtained prior to and following the intravenous administration of 7.5 ml of Gadavist Three-dimensional MR images were rendered by post-processing of the original MR data on an independent workstation. The three-dimensional MR images were interpreted, and findings are reported in the following complete MRI report for this study. Three dimensional images were evaluated at the independent interpreting workstation using the DynaCAD thin client. COMPARISON:  Previous exam(s). FINDINGS: Breast composition: c. Heterogeneous fibroglandular tissue. Background parenchymal enhancement: Moderate. Right breast: No mass or abnormal enhancement. Left breast: Post biopsy changes are seen in the 9 o'clock region of the left breast. There is an irregular enhancing mass measuring 1.3 cm (series 13, image 58). Signal void artifact is seen in this area from the biopsy marker clip. Lymph nodes: No abnormal appearing lymph nodes. Ancillary findings:  None. IMPRESSION: 1.3 cm enhancing mass in the 9 o'clock region of the left breast corresponding with the biopsy proven invasive mammary carcinoma. RECOMMENDATION: Treatment planning of the known left breast cancer is recommended. BI-RADS CATEGORY  6: Known biopsy-proven malignancy. Electronically Signed   By: Lillia Mountain M.D.   On: 01/15/2022 14:25  NM Sentinel Node Inj-No Rpt (Breast)  Result Date: 01/24/2022 Sulfur Colloid was injected by the Nuclear Medicine Technologist for sentinel lymph node localization.   MM Breast Surgical Specimen  Result Date: 01/24/2022 CLINICAL DATA:  61 year old with biopsy proven invasive mammary carcinoma with lobular features and lobular neoplasia involving the inner  LEFT breast. EXAM: SPECIMEN RADIOGRAPH OF THE LEFT BREAST COMPARISON:  Previous exams. FINDINGS: Status post excision of the LEFT breast. The specimen radiograph is submitted for interpretation post-operatively. The specimen  contains the Venus tissue marking clip. IMPRESSION: Specimen radiograph of the LEFT breast. Electronically Signed   By: Evangeline Dakin M.D.   On: 01/24/2022 15:37   ASSESSMENT & PLAN:   Carcinoma of overlapping sites of left breast in female, estrogen receptor positive ([place]) #Invasive lobular breast cancere ER;PR Positive; her 2 Negative; stage IA.  # #Discussed the role of adjuvant chemotherapy/endocrine therapy.  Recommend Oncotype testing.  Discussed the prognostic/predictive rational for the testing in detail. .  We will make decisions on chemotherapy based on the results of the Oncotype testing. Patient agreement.  We will order Oncotype testing today.  However low clinical likelihood the patient will need chemotherapy.   # #Discussed the mechanism of action of aromatase inhibitors-with blocking of estrogen to prevent breast cancer.  Also discussed the potential side effects including but not limited to arthralgias hot flashes and increased risk of osteoporosis.  We will check bone density next visit.  We will start this after radiation.  #We will make a referral to Dr. Donella Stade for consideration of radiation postlumpectomy.  Patient understands that if patient needs chemotherapy [it is again less likely]-she will need this prior to radiation.   # Genetics: Given the family history of malignancies recommend evaluation with genetics.  However low clinical suspicion for any genetic predisposition to malignancy; awaiting on 3/14.  Again discussed the rationale with the patient at length.   Will call re: oncotype results # DISPOSITION:  # refer to Dr.Chrystal re: Breast cancer # follow up in 2 months; MD: no labs-- Dr.B    All questions were answered. The patient/family  knows to call the clinic with any problems, questions or concerns.       Cammie Sickle, MD 02/07/2022 4:30 PM

## 2022-02-07 NOTE — Telephone Encounter (Signed)
Oncotype order submitted online (order #[id number]) for accession [id number]. ?

## 2022-02-11 ENCOUNTER — Encounter: Payer: Self-pay | Admitting: Licensed Clinical Social Worker

## 2022-02-11 ENCOUNTER — Inpatient Hospital Stay (HOSPITAL_BASED_OUTPATIENT_CLINIC_OR_DEPARTMENT_OTHER): Payer: BC Managed Care – PPO | Admitting: Licensed Clinical Social Worker

## 2022-02-11 ENCOUNTER — Inpatient Hospital Stay: Payer: BC Managed Care – PPO

## 2022-02-11 ENCOUNTER — Other Ambulatory Visit: Payer: Self-pay

## 2022-02-11 DIAGNOSIS — Z800 Family history of malignant neoplasm of digestive organs: Secondary | ICD-10-CM | POA: Diagnosis not present

## 2022-02-11 DIAGNOSIS — Z170 Estrogen receptor positive status [ER+]: Secondary | ICD-10-CM | POA: Diagnosis not present

## 2022-02-11 DIAGNOSIS — C50812 Malignant neoplasm of overlapping sites of left female breast: Secondary | ICD-10-CM

## 2022-02-11 DIAGNOSIS — Z803 Family history of malignant neoplasm of breast: Secondary | ICD-10-CM

## 2022-02-11 NOTE — Progress Notes (Signed)
REFERRING PROVIDER: ?Cammie Sickle, MD ?[place],  [place] [address] ? ?PRIMARY PROVIDER:  ?[name], PA-C ? ?PRIMARY REASON FOR VISIT:  ?1. Carcinoma of overlapping sites of left breast in female, estrogen receptor positive ([place])   ?2. Family history of breast cancer   ?3. Family history of pancreatic cancer   ?4. Family history of liver cancer   ? ? ? ?HISTORY OF PRESENT ILLNESS:   ?Tricia Rivera, a 61 y.o. female, was seen for a [place] cancer genetics consultation at the request of Dr. Rogue Bussing due to a personal and family history of cancer.  Tricia Rivera presents to clinic today to discuss the possibility of a hereditary predisposition to cancer, genetic testing, and to further clarify her future cancer risks, as well as potential cancer risks for family members.  ? ?In 2023, at the age of 71, Tricia Rivera was diagnosed with invasive mammary carcinoma of the left breast with lobular features, ER/PR+, HER2-. The treatment plan includes lumpectomy which was completed on 01/24/2022, Oncotype, adjuvant radiation, adjuvant antiestrogen therapy.  ? ? ?CANCER HISTORY:  ?Oncology History Overview Note  ?#Invasive lobular breast cancere ER;PR Positive; her 2 Negative; stage IA. mT [42m]; pN0; s/p lumpectomy; Oncotype -;  ? ?# IMPRESSION: ?1. A 6 mm mass in the LEFT inner breast at 9 o'clock 1 cm from the ?nipple with associated architectural distortion is indeterminate. ?Recommend ultrasound-guided biopsy for definitive characterization ?with attention on post marker placement mammogram to assess for ?mammographic/sonographic correlation. ?2. No suspicious LEFT axillary adenopathy. ? ?DIAGNOSIS:  ?A.  BREAST, LEFT 9:00 1 CM FN; ULTRASOUND-GUIDED BIOPSY:  ?- INVASIVE MAMMARY CARCINOMA WITH LOBULAR FEATURES.  ?- LOBULAR NEOPLASIA.  ? ?Size of invasive carcinoma: 8 mm in this sample  ?Histologic grade of invasive carcinoma: Grade 2  ?                     Glandular/tubular differentiation score:  3  ?                     Nuclear pleomorphism score: 2  ?                     Mitotic rate score: 1  ?                     Total score: 6  ?Ductal carcinoma in situ: Not identified  ?Lymphovascular invasion: Not identified  ? ?ER/PR/HER2: Immunohistochemistry will be performed on block A1, with  ?reflex to [place] HER2 2+. The results will be reported in an addendum.  ? ?Comment:  ?The definitive grade will be assigned on the excisional specimen.  ?  ?Carcinoma of overlapping sites of left breast in female, estrogen receptor positive ([place]  ?02/07/2022 Initial Diagnosis  ? Carcinoma of overlapping sites of left breast in female, estrogen receptor positive ([place] ?  ?02/07/2022 Cancer Staging  ? Staging form: Breast, AJCC 8th Edition ?- Pathologic: Stage IA (pT1c, pN0, cM0, G2, ER+, PR+, HER2-) - Signed by BCammie Sickle MD on 02/07/2022 ?Histologic grading system: 3 grade system ? ?  ? ? ? ?RISK FACTORS:  ?Menarche was at age 61  ?First live birth at age 61  ?Ovaries intact: yes.  ?Hysterectomy: no.  ?Menopausal status: postmenopausal.  ?Colonoscopy: yes;  1 polyp 5 years ago . ? ?Past Medical History:  ?Diagnosis Date  ? ADD (attention deficit disorder)   ? Anxiety   ?  Depression   ? Family history of breast cancer   ? Family history of liver cancer   ? Family history of pancreatic cancer   ? Hyperlipidemia   ? Malignant neoplasm of left breast ([place])   ? Psoriasis   ? Vitamin D deficiency   ? ? ?Past Surgical History:  ?Procedure Laterality Date  ? BREAST BIOPSY Left 01/08/2022  ? [place] bs, venus marker, path pending  ? BREAST LUMPECTOMY WITH SENTINEL LYMPH NODE BIOPSY Left 01/24/2022  ? Procedure: BREAST LUMPECTOMY WITH SENTINEL AXIILLARY LYMPH NODE BX;  Surgeon: Robert Bellow, MD;  Location: [facility] ORS;  Service: General;  Laterality: Left;  ? BUNIONECTOMY  12/01/2008  ? BUNIONECTOMY N/A   ? 2015  ? COLONOSCOPY    ? ? ?Social History  ? ?Socioeconomic History  ? Marital status: Married  ?  Spouse name: Not  on file  ? Number of children: Not on file  ? Years of education: Not on file  ? Highest education level: Not on file  ?Occupational History  ? Not on file  ?Tobacco Use  ? Smoking status: Never  ? Smokeless tobacco: Never  ?Vaping Use  ? Vaping Use: Never used  ?Substance and Sexual Activity  ? Alcohol use: Not Currently  ?  Alcohol/week: 3.0 standard drinks  ?  Types: 3 Glasses of wine per week  ? Drug use: No  ? Sexual activity: Not Currently  ?Other Topics Concern  ? Not on file  ?Social History Narrative  ? Lives in [place]; with husband; son' grandson. Daughter- in [place]. 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   ? ?Social Determinants of Health  ? ?Financial Resource Strain: Not on file  ?Food Insecurity: Not on file  ?Transportation Needs: Not on file  ?Physical Activity: Not on file  ?Stress: Not on file  ?Social Connections: Not on file  ?  ? ?FAMILY HISTORY:  ?We obtained a detailed, 4-generation family history.  Significant diagnoses are listed below: ?Family History  ?Problem Relation Age of Onset  ? Pancreatic cancer Mother   ? Liver cancer Father   ? Breast cancer Maternal Aunt   ? Liver cancer Maternal Grandfather   ? ?Tricia Rivera has 1 son, 28, and 1 daughter, 63, no cancers. She has 2 sisters, no cancers. ? ?Tricia Rivera mother passed at 22 of pancreatic cancer. Patient has 2 maternal aunts. One died of breast cancer, unknown age. Maternal grandmother died in her 68s, grandfather died over 19 of liver cancer. ? ?Tricia Rivera father died at 6 of liver cancer. Patient had 4 paternal uncles, 1 aunt. One of her uncles's daughters had breast cancer recently. Paternal grandmother had breast cancer over age 34, grandfather passed over age 58.  ? ?Tricia Rivera is unaware of previous family history of genetic testing for hereditary cancer risks. Patient's maternal ancestors are of unknown descent, and paternal ancestors are of unknown descent. There is no reported Ashkenazi Jewish  ancestry. There is no known consanguinity. ? ? ? ?GENETIC COUNSELING ASSESSMENT: Tricia Rivera is a 61 y.o. female with a personal and family history which is somewhat suggestive of a hereditary cancer syndrome and predisposition to cancer. We, therefore, discussed and recommended the following at today's visit.  ? ?DISCUSSION: We discussed that approximately 10% of breast cancer is hereditary. Most cases of hereditary breast cancer are associated with BRCA1/BRCA2 genes, although there are other genes associated with hereditary breast/pancreatic cancer as well. Cancers and risks are gene specific.  We discussed  that testing is beneficial for several reasons including knowing about other cancer risks, identifying potential screening and risk-reduction options that may be appropriate, and to understand if other family members could be at risk for cancer and allow them to undergo genetic testing.  ? ?We reviewed the characteristics, features and inheritance patterns of hereditary cancer syndromes. We also discussed genetic testing, including the appropriate family members to test, the process of testing, insurance coverage and turn-around-time for results. We discussed the implications of a negative, positive and/or variant of uncertain significant result. We recommended Ms. Vanness pursue genetic testing for the [facility] CancerNext-Expanded+RNA gene panel.  ? ?The CancerNext-Expanded + RNAinsight gene panel offered by [employer] and includes sequencing and rearrangement analysis for the following 77 genes: IP, ALK, APC*, ATM*, AXIN2, BAP1, BARD1, BLM, BMPR1A, BRCA1*, BRCA2*, BRIP1*, CDC73, CDH1*,CDK4, CDKN1B, CDKN2A, CHEK2*, CTNNA1, DICER1, FANCC, FH, FLCN, GALNT12, KIF1B, LZTR1, MAX, MEN1, MET, MLH1*, MSH2*, MSH3, MSH6*, MUTYH*, NBN, NF1*, NF2, NTHL1, PALB2*, PHOX2B, PMS2*, POT1, PRKAR1A, PTCH1, PTEN*, RAD51C*, RAD51D*,RB1, RECQL, RET, SDHA, SDHAF2, SDHB, SDHC, SDHD, SMAD4, SMARCA4, SMARCB1, SMARCE1, STK11, SUFU,  TMEM127, TP53*,TSC1, TSC2, VHL and XRCC2 (sequencing and deletion/duplication); EGFR, EGLN1, HOXB13, KIT, MITF, PDGFRA, POLD1 and POLE (sequencing only); EPCAM and GREM1 (deletion/duplication only). ? ?Based on

## 2022-02-14 ENCOUNTER — Encounter: Payer: Self-pay | Admitting: Radiation Oncology

## 2022-02-14 ENCOUNTER — Ambulatory Visit
Admission: RE | Admit: 2022-02-14 | Discharge: 2022-02-14 | Disposition: A | Discharge status: Home / Self Care | Payer: BC Managed Care – PPO | Source: Ambulatory Visit | Attending: Radiation Oncology | Admitting: Radiation Oncology

## 2022-02-14 ENCOUNTER — Other Ambulatory Visit: Payer: Self-pay

## 2022-02-14 VITALS — BP 141/83 | HR 80 | Temp 98.4°F | Resp 18 | Ht 64.0 in | Wt 134.4 lb

## 2022-02-14 DIAGNOSIS — Z803 Family history of malignant neoplasm of breast: Secondary | ICD-10-CM | POA: Diagnosis not present

## 2022-02-14 DIAGNOSIS — E785 Hyperlipidemia, unspecified: Secondary | ICD-10-CM | POA: Diagnosis not present

## 2022-02-14 DIAGNOSIS — Z800 Family history of malignant neoplasm of digestive organs: Secondary | ICD-10-CM | POA: Diagnosis not present

## 2022-02-14 DIAGNOSIS — C50812 Malignant neoplasm of overlapping sites of left female breast: Secondary | ICD-10-CM | POA: Diagnosis present

## 2022-02-14 DIAGNOSIS — E559 Vitamin D deficiency, unspecified: Secondary | ICD-10-CM | POA: Diagnosis not present

## 2022-02-14 DIAGNOSIS — Z170 Estrogen receptor positive status [ER+]: Secondary | ICD-10-CM | POA: Insufficient documentation

## 2022-02-14 NOTE — Consult Note (Signed)
?NEW PATIENT EVALUATION ? ?Name: Tricia Rivera  ?MRN: [id number]  ?Date:   02/14/2022     ?DOB: [birth year] ? ? ?This 60 y.o. female patient presents to the clinic for initial evaluation of stage Ia (T1 cN0 M0) ER/PR positive invasive lobular carcinoma of the left breast status post wide local excision and sentinel node biopsy. ? ?REFERRING PHYSICIAN: [name], PA-C ? ?CHIEF COMPLAINT:  ?Chief Complaint  ?Patient presents with  ? Breast Cancer  ?  Initial consultation  ? ? ?DIAGNOSIS: The encounter diagnosis was Carcinoma of overlapping sites of left breast in female, estrogen receptor positive ([place]). ?  ?PREVIOUS INVESTIGATIONS:  ?Mammogram and ultrasound and MRI reviewed ?Clinical notes reviewed ?Pathology report reviewed ? ?HPI: Patient is a 61 year old female presented with an abnormal mammogram of her left breast.  Tomogram showed a 6 mm mass in the left inner breast at the 9 o'clock position 1 cm from the nipple associated with architectural distortion.  She underwent ultrasound-guided biopsy which was positive for invasive mammary carcinoma.  By ultrasound there was no left axillary adenopathy.  MRI of her breast confirm 1.3 cm enhancing mass at 9 o'clock position of the left breast corresponding to the biopsy-proven invasive mammary carcinoma.  She underwent a wide local excision and sentinel node biopsy by Dr. Tollie Pizza showing a 1.1 cm overall grade 2 invasive lobular carcinoma with clear margins of 2 mm.  She had 5 lymph nodes examined all negative for metastatic disease.  She was strongly ER positive borderline PR positive HER2/neu not overexpressed.  Oncotype DX has been sent out.  She is doing well postoperatively specifically denies breast tenderness cough or bone pain.  She is now referred to radiation oncology for consideration of treatment ?PLANNED TREATMENT REGIMEN: Left hypofractionated whole breast radiation ? ?PAST MEDICAL HISTORY:  has a past medical history of ADD (attention deficit  disorder), Anxiety, Depression, Family history of breast cancer, Family history of liver cancer, Family history of pancreatic cancer, Hyperlipidemia, Malignant neoplasm of left breast ([place]), Psoriasis, and Vitamin D deficiency.   ? ?PAST SURGICAL HISTORY:  ?Past Surgical History:  ?Procedure Laterality Date  ? BREAST BIOPSY Left 01/08/2022  ? [place] bs, venus marker, path pending  ? BREAST LUMPECTOMY WITH SENTINEL LYMPH NODE BIOPSY Left 01/24/2022  ? Procedure: BREAST LUMPECTOMY WITH SENTINEL AXIILLARY LYMPH NODE BX;  Surgeon: Robert Bellow, MD;  Location: [facility] ORS;  Service: General;  Laterality: Left;  ? BUNIONECTOMY  12/01/2008  ? BUNIONECTOMY N/A   ? 2015  ? COLONOSCOPY    ? ? ?FAMILY HISTORY: family history includes Breast cancer in her maternal aunt; Liver cancer in her father and maternal grandfather; Pancreatic cancer in her mother. ? ?SOCIAL HISTORY:  reports that she has never smoked. She has never used smokeless tobacco. She reports that she does not currently use alcohol after a past usage of about 3.0 standard drinks per week. She reports that she does not use drugs. ? ?ALLERGIES: Patient has no known allergies. ? ?MEDICATIONS:  ?Current Outpatient Medications  ?Medication Sig Dispense Refill  ? levothyroxine (SYNTHROID) 50 MCG tablet TAKE 1 TABLET BY MOUTH EVERY DAY 90 tablet 0  ? triamcinolone cream (KENALOG) 0.1 % APPLY TWICE A DAY TO AFFECTED AREAS (Patient taking differently: Apply 1 application. topically 2 (two) times daily as needed. APPLY TWICE A DAY TO AFFECTED AREAS) 30 g 6  ? Vitamin D, Ergocalciferol, (DRISDOL) 1.25 MG (50000 UNIT) CAPS capsule Take 1 capsule (50,000 Units total) by mouth every 7 (  seven) days. 12 capsule 1  ? ZORYVE 0.3 % CREA Apply 1 application topically daily as needed (Psoriasis). (Patient not taking: Reported on 02/14/2022)    ? ?No current facility-administered medications for this encounter.  ? ? ?ECOG PERFORMANCE STATUS:  0 - Asymptomatic ? ?REVIEW OF  SYSTEMS: ?Patient denies any weight loss, fatigue, weakness, fever, chills or night sweats. Patient denies any loss of vision, blurred vision. Patient denies any ringing  of the ears or hearing loss. No irregular heartbeat. Patient denies heart murmur or history of fainting. Patient denies any chest pain or pain radiating to her upper extremities. Patient denies any shortness of breath, difficulty breathing at night, cough or hemoptysis. Patient denies any swelling in the lower legs. Patient denies any nausea vomiting, vomiting of blood, or coffee ground material in the vomitus. Patient denies any stomach pain. Patient states has had normal bowel movements no significant constipation or diarrhea. Patient denies any dysuria, hematuria or significant nocturia. Patient denies any problems walking, swelling in the joints or loss of balance. Patient denies any skin changes, loss of hair or loss of weight. Patient denies any excessive worrying or anxiety or significant depression. Patient denies any problems with insomnia. Patient denies excessive thirst, polyuria, polydipsia. Patient denies any swollen glands, patient denies easy bruising or easy bleeding. Patient denies any recent infections, allergies or URI. Patient "s visual fields have not changed significantly in recent time. ?  ?PHYSICAL EXAM: ?BP (!) 141/83 (BP Location: Left Arm, Patient Position: Sitting)   Pulse 80   Temp 98.4 ?F (36.9 ?C) (Tympanic)   Resp 18   Ht _0  (1.626 m)   Wt 134 lb 6.4 oz (61 kg)   BMI 23.07 kg/m?  ?She is status post the retroareolar incision with which is healed well no dominant masses noted in either breast no axillary or supraclavicular adenopathy is identified.  Well-developed well-nourished patient in NAD. HEENT reveals PERLA, EOMI, discs not visualized.  Oral cavity is clear. No oral mucosal lesions are identified. Neck is clear without evidence of cervical or supraclavicular adenopathy. Lungs are clear to A&P. Cardiac  examination is essentially unremarkable with regular rate and rhythm without murmur rub or thrill. Abdomen is benign with no organomegaly or masses noted. Motor sensory and DTR levels are equal and symmetric in the upper and lower extremities. Cranial nerves II through XII are grossly intact. Proprioception is intact. No peripheral adenopathy or edema is identified. No motor or sensory levels are noted. Crude visual fields are within normal range. ? ?LABORATORY DATA: Pathology report reviewed ? ?  ?RADIOLOGY RESULTS: Mammograms ultrasound and MRI scans all reviewed compatible with above-stated findings ? ? ?IMPRESSION: Stage Ia invasive lobular carcinoma of the left breast status post wide local excision and sentinel node biopsy in 61 year old female ? ?PLAN: At this time I have recommended adjuvant radiation therapy.  I believe I can do hypofractionated course of treatment over 3 weeks boosting her scar another 1000 cGy using electron beam.  Risks and benefits of treatment including skin reaction fatigue alteration of blood counts possible inclusion of superficial lung all were reviewed reviewed with the patient and her husband.  They both seem to comprehend my treatment plan well.  Because it is invasive lobular carcinoma I doubt she will need chemotherapy although we will wait for Oncotype DX to be returned prior to starting treatments.  I have pushed out her simulation about 2 weeks.  We will certainly check on her Oncotype DX prior to initiating  treatment.  She will obviously benefit from antiestrogen therapy after completion of radiation. ? ?I would like to take this opportunity to thank you for allowing me to participate in the care of your patient.. ? ?Noreene Filbert, MD ? ? ? ? ? ? ? ? ?

## 2022-02-18 ENCOUNTER — Encounter: Payer: Self-pay | Admitting: Internal Medicine

## 2022-02-19 NOTE — Telephone Encounter (Signed)
Results available for review in chart. ?

## 2022-02-24 ENCOUNTER — Ambulatory Visit: Payer: Self-pay | Admitting: Licensed Clinical Social Worker

## 2022-02-24 ENCOUNTER — Encounter: Payer: Self-pay | Admitting: Licensed Clinical Social Worker

## 2022-02-24 ENCOUNTER — Telehealth: Payer: Self-pay | Admitting: Licensed Clinical Social Worker

## 2022-02-24 DIAGNOSIS — Z1379 Encounter for other screening for genetic and chromosomal anomalies: Secondary | ICD-10-CM | POA: Insufficient documentation

## 2022-02-24 NOTE — Progress Notes (Signed)
HPI:  Tricia Rivera was previously seen in the [place] clinic due to a personal and family history of cancer and concerns regarding a hereditary predisposition to cancer. Please refer to our prior cancer genetics clinic note for more information regarding our discussion, assessment and recommendations, at the time. Tricia Rivera recent genetic test results were disclosed to her, as were recommendations warranted by these results. These results and recommendations are discussed in more detail below. ? ?CANCER HISTORY:  ?Oncology History Overview Note  ?#Invasive lobular breast cancere ER;PR Positive; her 2 Negative; stage IA. mT [38mm]; pN0; s/p lumpectomy; Oncotype -;  ? ?# IMPRESSION: ?1. A 6 mm mass in the LEFT inner breast at 9 o'clock 1 cm from the ?nipple with associated architectural distortion is indeterminate. ?Recommend ultrasound-guided biopsy for definitive characterization ?with attention on post marker placement mammogram to assess for ?mammographic/sonographic correlation. ?2. No suspicious LEFT axillary adenopathy. ? ?DIAGNOSIS:  ?A.  BREAST, LEFT 9:00 1 CM FN; ULTRASOUND-GUIDED BIOPSY:  ?- INVASIVE MAMMARY CARCINOMA WITH LOBULAR FEATURES.  ?- LOBULAR NEOPLASIA.  ? ?Size of invasive carcinoma: 8 mm in this sample  ?Histologic grade of invasive carcinoma: Grade 2  ?                     Glandular/tubular differentiation score: 3  ?                     Nuclear pleomorphism score: 2  ?                     Mitotic rate score: 1  ?                     Total score: 6  ?Ductal carcinoma in situ: Not identified  ?Lymphovascular invasion: Not identified  ? ?ER/PR/HER2: Immunohistochemistry will be performed on block A1, with  ?reflex to [place] for HER2 2+. The results will be reported in an addendum.  ? ?Comment:  ?The definitive grade will be assigned on the excisional specimen.  ?  ?Carcinoma of overlapping sites of left breast in female, estrogen receptor positive ([place])  ?02/07/2022 Initial  Diagnosis  ? Carcinoma of overlapping sites of left breast in female, estrogen receptor positive ([place]) ?  ?02/07/2022 Cancer Staging  ? Staging form: Breast, AJCC 8th Edition ?- Pathologic: Stage IA (pT1c, pN0, cM0, G2, ER+, PR+, HER2-) - Signed by Cammie Sickle, MD on 02/07/2022 ?Histologic grading system: 3 grade system ? ?  ? Genetic Testing  ? Negative genetic testing. No pathogenic variants identified on the [facility] CancerNext-Expanded+RNA panel. The report date is 02/21/2022. ? ?The CancerNext-Expanded + RNAinsight gene panel offered by [employer] and includes sequencing and rearrangement analysis for the following 77 genes: IP, ALK, APC*, ATM*, AXIN2, BAP1, BARD1, BLM, BMPR1A, BRCA1*, BRCA2*, BRIP1*, CDC73, CDH1*,CDK4, CDKN1B, CDKN2A, CHEK2*, CTNNA1, DICER1, FANCC, FH, FLCN, GALNT12, KIF1B, LZTR1, MAX, MEN1, MET, MLH1*, MSH2*, MSH3, MSH6*, MUTYH*, NBN, NF1*, NF2, NTHL1, PALB2*, PHOX2B, PMS2*, POT1, PRKAR1A, PTCH1, PTEN*, RAD51C*, RAD51D*,RB1, RECQL, RET, SDHA, SDHAF2, SDHB, SDHC, SDHD, SMAD4, SMARCA4, SMARCB1, SMARCE1, STK11, SUFU, TMEM127, TP53*,TSC1, TSC2, VHL and XRCC2 (sequencing and deletion/duplication); EGFR, EGLN1, HOXB13, KIT, MITF, PDGFRA, POLD1 and POLE (sequencing only); EPCAM and GREM1 (deletion/duplication only). ?  ? ? ?FAMILY HISTORY:  ?We obtained a detailed, 4-generation family history.  Significant diagnoses are listed below: ?Family History  ?Problem Relation Age of Onset  ? Pancreatic cancer Mother   ? Liver  cancer Father   ? Breast cancer Maternal Aunt   ? Liver cancer Maternal Grandfather   ?Tricia Rivera has 1 son, 81, and 1 daughter, 70, no cancers. She has 2 sisters, no cancers. ?  ?Tricia Rivera's mother passed at 70 of pancreatic cancer. Patient has 2 maternal aunts. One died of breast cancer, unknown age. Maternal grandmother died in her 16s, grandfather died over 42 of liver cancer. ?  ?Tricia Rivera's father died at 81 of liver cancer. Patient had 4 paternal uncles, 1 aunt. One of  her uncles's daughters had breast cancer recently. Paternal grandmother had breast cancer over age 69, grandfather passed over age 55.  ?  ?Tricia Rivera is unaware of previous family history of genetic testing for hereditary cancer risks. Patient's maternal ancestors are of unknown descent, and paternal ancestors are of unknown descent. There is no reported Ashkenazi Jewish ancestry. There is no known consanguinity. ?  ? ? ? ? ?GENETIC TEST RESULTS: Genetic testing reported out on 02/21/2022 through the [employer] CancerNext-Expanded+RNA cancer panel found no pathogenic mutations.  ? ?The CancerNext-Expanded + RNAinsight gene panel offered by [employer] and includes sequencing and rearrangement analysis for the following 77 genes: IP, ALK, APC*, ATM*, AXIN2, BAP1, BARD1, BLM, BMPR1A, BRCA1*, BRCA2*, BRIP1*, CDC73, CDH1*,CDK4, CDKN1B, CDKN2A, CHEK2*, CTNNA1, DICER1, FANCC, FH, FLCN, GALNT12, KIF1B, LZTR1, MAX, MEN1, MET, MLH1*, MSH2*, MSH3, MSH6*, MUTYH*, NBN, NF1*, NF2, NTHL1, PALB2*, PHOX2B, PMS2*, POT1, PRKAR1A, PTCH1, PTEN*, RAD51C*, RAD51D*,RB1, RECQL, RET, SDHA, SDHAF2, SDHB, SDHC, SDHD, SMAD4, SMARCA4, SMARCB1, SMARCE1, STK11, SUFU, TMEM127, TP53*,TSC1, TSC2, VHL and XRCC2 (sequencing and deletion/duplication); EGFR, EGLN1, HOXB13, KIT, MITF, PDGFRA, POLD1 and POLE (sequencing only); EPCAM and GREM1 (deletion/duplication only).  ? ?The test report has been scanned into EPIC and is located under the Molecular Pathology section of the Results Review tab.  A portion of the result report is included below for reference.  ? ? ?We discussed that because current genetic testing is not perfect, it is possible there may be a gene mutation in one of these genes that current testing cannot detect, but that chance is small.  There could be another gene that has not yet been discovered, or that we have not yet tested, that is responsible for the cancer diagnoses in the family. It is also possible there is a hereditary cause  for the cancer in the family that Tricia Rivera did not inherit and therefore was not identified in her testing.  Therefore, it is important to remain in touch with cancer genetics in the future so that we can continue to offer Tricia Rivera the most up to date genetic testing.  ? ?ADDITIONAL GENETIC TESTING: We discussed with Tricia Rivera that her genetic testing was fairly extensive.  If there are genes identified to increase cancer risk that can be analyzed in the future, we would be happy to discuss and coordinate this testing at that time.   ? ?CANCER SCREENING RECOMMENDATIONS: Tricia Rivera test result is considered negative (normal).  This means that we have not identified a hereditary cause for her  personal and family history of cancer at this time. Most cancers happen by chance and this negative test suggests that her cancer may fall into this category.   ? ?While reassuring, this does not definitively rule out a hereditary predisposition to cancer. It is still possible that there could be genetic mutations that are undetectable by current technology. There could be genetic mutations in genes that have not been tested or identified to  increase cancer risk.  Therefore, it is recommended she continue to follow the cancer management and screening guidelines provided by her oncology and primary healthcare provider.  ? ?An individual's cancer risk and medical management are not determined by genetic test results alone. Overall cancer risk assessment incorporates additional factors, including personal medical history, family history, and any available genetic information that may result in a personalized plan for cancer prevention and surveillance. ? ?RECOMMENDATIONS FOR FAMILY MEMBERS:  Relatives in this family might be at some increased risk of developing cancer, over the general population risk, simply due to the family history of cancer.  We recommended female relatives in this family have a yearly mammogram  beginning at age 9, or 51 years younger than the earliest onset of cancer, an annual clinical breast exam, and perform monthly breast self-exams. Female relatives in this family should also have a gynecological e

## 2022-02-24 NOTE — Telephone Encounter (Signed)
Revealed negative genetic testing.  This normal result is reassuring and indicates that it is unlikely Tricia Rivera's cancer is due to a hereditary cause.  It is unlikely that there is an increased risk of another cancer due to a mutation in one of these genes.  However, genetic testing is not perfect, and cannot definitively rule out a hereditary cause.  It will be important for her to keep in contact with genetics to learn if any additional testing may be needed in the future.    ? ?

## 2022-02-27 ENCOUNTER — Telehealth: Payer: Self-pay | Admitting: *Deleted

## 2022-02-27 ENCOUNTER — Ambulatory Visit
Admission: RE | Admit: 2022-02-27 | Discharge: 2022-02-27 | Disposition: A | Discharge status: Home / Self Care | Payer: BC Managed Care – PPO | Source: Ambulatory Visit | Attending: Radiation Oncology | Admitting: Radiation Oncology

## 2022-02-27 ENCOUNTER — Other Ambulatory Visit: Payer: Self-pay | Admitting: *Deleted

## 2022-02-27 DIAGNOSIS — Z170 Estrogen receptor positive status [ER+]: Secondary | ICD-10-CM

## 2022-02-27 DIAGNOSIS — C50812 Malignant neoplasm of overlapping sites of left female breast: Secondary | ICD-10-CM | POA: Diagnosis present

## 2022-02-27 DIAGNOSIS — Z510 Encounter for antineoplastic radiation therapy: Secondary | ICD-10-CM | POA: Diagnosis present

## 2022-02-27 MED ORDER — ALPRAZOLAM 0.25 MG PO TABS: 0.2500 mg | ORAL_TABLET | Freq: Every day | ORAL | 0 refills | Status: DC

## 2022-02-27 NOTE — Telephone Encounter (Signed)
Spoke with patient regarding this prescription.  ?

## 2022-02-27 NOTE — Telephone Encounter (Signed)
Patient called stating that she does want to take the Valium before her radiation therapy treatments as offered by Dr Baruch Gouty and to please send in prescription for it ?

## 2022-03-01 DIAGNOSIS — Z510 Encounter for antineoplastic radiation therapy: Secondary | ICD-10-CM | POA: Diagnosis present

## 2022-03-01 DIAGNOSIS — C50812 Malignant neoplasm of overlapping sites of left female breast: Secondary | ICD-10-CM | POA: Diagnosis present

## 2022-03-01 DIAGNOSIS — Z170 Estrogen receptor positive status [ER+]: Secondary | ICD-10-CM | POA: Diagnosis not present

## 2022-03-05 ENCOUNTER — Ambulatory Visit: Admission: RE | Admit: 2022-03-05 | Payer: BC Managed Care – PPO | Source: Ambulatory Visit

## 2022-03-05 ENCOUNTER — Other Ambulatory Visit: Payer: Self-pay | Admitting: *Deleted

## 2022-03-05 DIAGNOSIS — C50812 Malignant neoplasm of overlapping sites of left female breast: Secondary | ICD-10-CM | POA: Diagnosis not present

## 2022-03-05 MED ORDER — ALPRAZOLAM 0.25 MG PO TABS: 0.2500 mg | ORAL_TABLET | Freq: Every day | ORAL | 0 refills | Status: DC

## 2022-03-06 ENCOUNTER — Ambulatory Visit
Admission: RE | Admit: 2022-03-06 | Discharge: 2022-03-06 | Disposition: A | Discharge status: Home / Self Care | Payer: BC Managed Care – PPO | Source: Ambulatory Visit | Attending: Radiation Oncology | Admitting: Radiation Oncology

## 2022-03-06 DIAGNOSIS — C50812 Malignant neoplasm of overlapping sites of left female breast: Secondary | ICD-10-CM | POA: Diagnosis not present

## 2022-03-07 ENCOUNTER — Ambulatory Visit
Admission: RE | Admit: 2022-03-07 | Discharge: 2022-03-07 | Disposition: A | Discharge status: Home / Self Care | Payer: BC Managed Care – PPO | Source: Ambulatory Visit | Attending: Radiation Oncology | Admitting: Radiation Oncology

## 2022-03-07 DIAGNOSIS — C50812 Malignant neoplasm of overlapping sites of left female breast: Secondary | ICD-10-CM | POA: Diagnosis not present

## 2022-03-10 ENCOUNTER — Ambulatory Visit
Admission: RE | Admit: 2022-03-10 | Discharge: 2022-03-10 | Disposition: A | Discharge status: Home / Self Care | Payer: BC Managed Care – PPO | Source: Ambulatory Visit | Attending: Radiation Oncology | Admitting: Radiation Oncology

## 2022-03-10 ENCOUNTER — Encounter: Payer: Self-pay | Admitting: Internal Medicine

## 2022-03-10 DIAGNOSIS — C50812 Malignant neoplasm of overlapping sites of left female breast: Secondary | ICD-10-CM | POA: Diagnosis not present

## 2022-03-11 ENCOUNTER — Ambulatory Visit
Admission: RE | Admit: 2022-03-11 | Discharge: 2022-03-11 | Disposition: A | Discharge status: Home / Self Care | Payer: BC Managed Care – PPO | Source: Ambulatory Visit | Attending: Radiation Oncology | Admitting: Radiation Oncology

## 2022-03-11 DIAGNOSIS — C50812 Malignant neoplasm of overlapping sites of left female breast: Secondary | ICD-10-CM | POA: Diagnosis not present

## 2022-03-12 ENCOUNTER — Ambulatory Visit
Admission: RE | Admit: 2022-03-12 | Discharge: 2022-03-12 | Disposition: A | Discharge status: Home / Self Care | Payer: BC Managed Care – PPO | Source: Ambulatory Visit | Attending: Radiation Oncology | Admitting: Radiation Oncology

## 2022-03-12 DIAGNOSIS — C50812 Malignant neoplasm of overlapping sites of left female breast: Secondary | ICD-10-CM | POA: Diagnosis not present

## 2022-03-13 ENCOUNTER — Ambulatory Visit
Admission: RE | Admit: 2022-03-13 | Discharge: 2022-03-13 | Disposition: A | Discharge status: Home / Self Care | Payer: BC Managed Care – PPO | Source: Ambulatory Visit | Attending: Radiation Oncology | Admitting: Radiation Oncology

## 2022-03-13 DIAGNOSIS — C50812 Malignant neoplasm of overlapping sites of left female breast: Secondary | ICD-10-CM | POA: Diagnosis not present

## 2022-03-14 ENCOUNTER — Ambulatory Visit
Admission: RE | Admit: 2022-03-14 | Discharge: 2022-03-14 | Disposition: A | Discharge status: Home / Self Care | Payer: BC Managed Care – PPO | Source: Ambulatory Visit | Attending: Radiation Oncology | Admitting: Radiation Oncology

## 2022-03-14 DIAGNOSIS — C50812 Malignant neoplasm of overlapping sites of left female breast: Secondary | ICD-10-CM | POA: Diagnosis not present

## 2022-03-17 ENCOUNTER — Ambulatory Visit
Admission: RE | Admit: 2022-03-17 | Discharge: 2022-03-17 | Disposition: A | Discharge status: Home / Self Care | Payer: BC Managed Care – PPO | Source: Ambulatory Visit | Attending: Radiation Oncology | Admitting: Radiation Oncology

## 2022-03-17 DIAGNOSIS — C50812 Malignant neoplasm of overlapping sites of left female breast: Secondary | ICD-10-CM | POA: Diagnosis not present

## 2022-03-18 ENCOUNTER — Ambulatory Visit
Admission: RE | Admit: 2022-03-18 | Discharge: 2022-03-18 | Disposition: A | Discharge status: Home / Self Care | Payer: BC Managed Care – PPO | Source: Ambulatory Visit | Attending: Radiation Oncology | Admitting: Radiation Oncology

## 2022-03-18 ENCOUNTER — Other Ambulatory Visit: Payer: Self-pay

## 2022-03-18 DIAGNOSIS — C50812 Malignant neoplasm of overlapping sites of left female breast: Secondary | ICD-10-CM | POA: Diagnosis not present

## 2022-03-18 LAB — RAD ONC ARIA SESSION SUMMARY
Course Elapsed Days: 12
Plan Fractions Treated to Date: 9
Plan Prescribed Dose Per Fraction: 2.66 Gy
Plan Total Fractions Prescribed: 16
Plan Total Prescribed Dose: 42.56 Gy
Reference Point Dosage Given to Date: 23.94 Gy
Reference Point Session Dosage Given: 2.66 Gy
Session Number: 9

## 2022-03-19 ENCOUNTER — Ambulatory Visit
Admission: RE | Admit: 2022-03-19 | Discharge: 2022-03-19 | Disposition: A | Discharge status: Home / Self Care | Payer: BC Managed Care – PPO | Source: Ambulatory Visit | Attending: Radiation Oncology | Admitting: Radiation Oncology

## 2022-03-19 ENCOUNTER — Other Ambulatory Visit: Payer: Self-pay

## 2022-03-19 DIAGNOSIS — C50812 Malignant neoplasm of overlapping sites of left female breast: Secondary | ICD-10-CM | POA: Diagnosis not present

## 2022-03-19 LAB — RAD ONC ARIA SESSION SUMMARY
Course Elapsed Days: 13
Plan Fractions Treated to Date: 10
Plan Prescribed Dose Per Fraction: 2.66 Gy
Plan Total Fractions Prescribed: 16
Plan Total Prescribed Dose: 42.56 Gy
Reference Point Dosage Given to Date: 26.6 Gy
Reference Point Session Dosage Given: 2.66 Gy
Session Number: 10

## 2022-03-20 ENCOUNTER — Ambulatory Visit
Admission: RE | Admit: 2022-03-20 | Discharge: 2022-03-20 | Disposition: A | Discharge status: Home / Self Care | Payer: BC Managed Care – PPO | Source: Ambulatory Visit | Attending: Radiation Oncology | Admitting: Radiation Oncology

## 2022-03-20 ENCOUNTER — Other Ambulatory Visit: Payer: Self-pay

## 2022-03-20 ENCOUNTER — Inpatient Hospital Stay: Payer: BC Managed Care – PPO

## 2022-03-20 DIAGNOSIS — Z170 Estrogen receptor positive status [ER+]: Secondary | ICD-10-CM | POA: Insufficient documentation

## 2022-03-20 DIAGNOSIS — C50812 Malignant neoplasm of overlapping sites of left female breast: Secondary | ICD-10-CM | POA: Insufficient documentation

## 2022-03-20 LAB — RAD ONC ARIA SESSION SUMMARY
Course Elapsed Days: 14
Plan Fractions Treated to Date: 11
Plan Prescribed Dose Per Fraction: 2.66 Gy
Plan Total Fractions Prescribed: 16
Plan Total Prescribed Dose: 42.56 Gy
Reference Point Dosage Given to Date: 29.26 Gy
Reference Point Session Dosage Given: 2.66 Gy
Session Number: 11

## 2022-03-20 LAB — CBC
HCT: 40.7 % (ref 36.0–46.0)
Hemoglobin: 13.9 g/dL (ref 12.0–15.0)
MCH: 34.2 pg — ABNORMAL HIGH (ref 26.0–34.0)
MCHC: 34.2 g/dL (ref 30.0–36.0)
MCV: 100 fL (ref 80.0–100.0)
Platelets: 288 10*3/uL (ref 150–400)
RBC: 4.07 MIL/uL (ref 3.87–5.11)
RDW: 12.7 % (ref 11.5–15.5)
WBC: 5.1 10*3/uL (ref 4.0–10.5)
nRBC: 0 % (ref 0.0–0.2)

## 2022-03-21 ENCOUNTER — Ambulatory Visit
Admission: RE | Admit: 2022-03-21 | Discharge: 2022-03-21 | Disposition: A | Discharge status: Home / Self Care | Payer: BC Managed Care – PPO | Source: Ambulatory Visit | Attending: Radiation Oncology | Admitting: Radiation Oncology

## 2022-03-21 ENCOUNTER — Other Ambulatory Visit: Payer: Self-pay

## 2022-03-21 DIAGNOSIS — C50812 Malignant neoplasm of overlapping sites of left female breast: Secondary | ICD-10-CM | POA: Diagnosis not present

## 2022-03-21 LAB — RAD ONC ARIA SESSION SUMMARY
Course Elapsed Days: 15
Plan Fractions Treated to Date: 12
Plan Prescribed Dose Per Fraction: 2.66 Gy
Plan Total Fractions Prescribed: 16
Plan Total Prescribed Dose: 42.56 Gy
Reference Point Dosage Given to Date: 31.92 Gy
Reference Point Session Dosage Given: 2.66 Gy
Session Number: 12

## 2022-03-24 ENCOUNTER — Other Ambulatory Visit: Payer: Self-pay

## 2022-03-24 ENCOUNTER — Ambulatory Visit
Admission: RE | Admit: 2022-03-24 | Discharge: 2022-03-24 | Disposition: A | Discharge status: Home / Self Care | Payer: BC Managed Care – PPO | Source: Ambulatory Visit | Attending: Radiation Oncology | Admitting: Radiation Oncology

## 2022-03-24 DIAGNOSIS — C50812 Malignant neoplasm of overlapping sites of left female breast: Secondary | ICD-10-CM | POA: Diagnosis not present

## 2022-03-24 LAB — RAD ONC ARIA SESSION SUMMARY
Course Elapsed Days: 18
Plan Fractions Treated to Date: 13
Plan Prescribed Dose Per Fraction: 2.66 Gy
Plan Total Fractions Prescribed: 16
Plan Total Prescribed Dose: 42.56 Gy
Reference Point Dosage Given to Date: 34.58 Gy
Reference Point Session Dosage Given: 2.66 Gy
Session Number: 13

## 2022-03-25 ENCOUNTER — Other Ambulatory Visit: Payer: Self-pay

## 2022-03-25 ENCOUNTER — Ambulatory Visit
Admission: RE | Admit: 2022-03-25 | Discharge: 2022-03-25 | Disposition: A | Discharge status: Home / Self Care | Payer: BC Managed Care – PPO | Source: Ambulatory Visit | Attending: Radiation Oncology | Admitting: Radiation Oncology

## 2022-03-25 DIAGNOSIS — C50812 Malignant neoplasm of overlapping sites of left female breast: Secondary | ICD-10-CM | POA: Diagnosis not present

## 2022-03-25 LAB — RAD ONC ARIA SESSION SUMMARY
Course Elapsed Days: 19
Plan Fractions Treated to Date: 14
Plan Prescribed Dose Per Fraction: 2.66 Gy
Plan Total Fractions Prescribed: 16
Plan Total Prescribed Dose: 42.56 Gy
Reference Point Dosage Given to Date: 37.24 Gy
Reference Point Session Dosage Given: 2.66 Gy
Session Number: 14

## 2022-03-26 ENCOUNTER — Ambulatory Visit
Admission: RE | Admit: 2022-03-26 | Discharge: 2022-03-26 | Disposition: A | Discharge status: Home / Self Care | Payer: BC Managed Care – PPO | Source: Ambulatory Visit | Attending: Radiation Oncology | Admitting: Radiation Oncology

## 2022-03-26 ENCOUNTER — Other Ambulatory Visit: Payer: Self-pay

## 2022-03-26 DIAGNOSIS — C50812 Malignant neoplasm of overlapping sites of left female breast: Secondary | ICD-10-CM | POA: Diagnosis not present

## 2022-03-26 LAB — RAD ONC ARIA SESSION SUMMARY
Course Elapsed Days: 20
Plan Fractions Treated to Date: 15
Plan Prescribed Dose Per Fraction: 2.66 Gy
Plan Total Fractions Prescribed: 16
Plan Total Prescribed Dose: 42.56 Gy
Reference Point Dosage Given to Date: 39.9 Gy
Reference Point Session Dosage Given: 2.66 Gy
Session Number: 15

## 2022-03-27 ENCOUNTER — Ambulatory Visit
Admission: RE | Admit: 2022-03-27 | Discharge: 2022-03-27 | Disposition: A | Discharge status: Home / Self Care | Payer: BC Managed Care – PPO | Source: Ambulatory Visit | Attending: Radiation Oncology | Admitting: Radiation Oncology

## 2022-03-27 ENCOUNTER — Other Ambulatory Visit: Payer: Self-pay

## 2022-03-27 DIAGNOSIS — C50812 Malignant neoplasm of overlapping sites of left female breast: Secondary | ICD-10-CM | POA: Diagnosis not present

## 2022-03-27 LAB — RAD ONC ARIA SESSION SUMMARY
Course Elapsed Days: 21
Plan Fractions Treated to Date: 16
Plan Prescribed Dose Per Fraction: 2.66 Gy
Plan Total Fractions Prescribed: 16
Plan Total Prescribed Dose: 42.56 Gy
Reference Point Dosage Given to Date: 42.56 Gy
Reference Point Session Dosage Given: 2.66 Gy
Session Number: 16

## 2022-03-28 ENCOUNTER — Other Ambulatory Visit: Payer: Self-pay

## 2022-03-28 ENCOUNTER — Ambulatory Visit
Admission: RE | Admit: 2022-03-28 | Discharge: 2022-03-28 | Disposition: A | Discharge status: Home / Self Care | Payer: BC Managed Care – PPO | Source: Ambulatory Visit | Attending: Radiation Oncology | Admitting: Radiation Oncology

## 2022-03-28 DIAGNOSIS — C50812 Malignant neoplasm of overlapping sites of left female breast: Secondary | ICD-10-CM | POA: Diagnosis not present

## 2022-03-28 LAB — RAD ONC ARIA SESSION SUMMARY
Course Elapsed Days: 22
Plan Fractions Treated to Date: 1
Plan Prescribed Dose Per Fraction: 2 Gy
Plan Total Fractions Prescribed: 5
Plan Total Prescribed Dose: 10 Gy
Reference Point Dosage Given to Date: 44.56 Gy
Reference Point Session Dosage Given: 2 Gy
Session Number: 17

## 2022-03-30 ENCOUNTER — Other Ambulatory Visit: Payer: Self-pay | Admitting: Physician Assistant

## 2022-03-31 ENCOUNTER — Ambulatory Visit
Admission: RE | Admit: 2022-03-31 | Discharge: 2022-03-31 | Disposition: A | Discharge status: Home / Self Care | Payer: BC Managed Care – PPO | Source: Ambulatory Visit | Attending: Radiation Oncology | Admitting: Radiation Oncology

## 2022-03-31 ENCOUNTER — Other Ambulatory Visit: Payer: Self-pay

## 2022-03-31 DIAGNOSIS — Z170 Estrogen receptor positive status [ER+]: Secondary | ICD-10-CM | POA: Insufficient documentation

## 2022-03-31 DIAGNOSIS — Z510 Encounter for antineoplastic radiation therapy: Secondary | ICD-10-CM | POA: Diagnosis present

## 2022-03-31 DIAGNOSIS — C50812 Malignant neoplasm of overlapping sites of left female breast: Secondary | ICD-10-CM | POA: Insufficient documentation

## 2022-03-31 LAB — RAD ONC ARIA SESSION SUMMARY
Course Elapsed Days: 25
Plan Fractions Treated to Date: 2
Plan Prescribed Dose Per Fraction: 2 Gy
Plan Total Fractions Prescribed: 5
Plan Total Prescribed Dose: 10 Gy
Reference Point Dosage Given to Date: 46.56 Gy
Reference Point Session Dosage Given: 2 Gy
Session Number: 18

## 2022-04-01 ENCOUNTER — Ambulatory Visit
Admission: RE | Admit: 2022-04-01 | Discharge: 2022-04-01 | Disposition: A | Discharge status: Home / Self Care | Payer: BC Managed Care – PPO | Source: Ambulatory Visit | Attending: Radiation Oncology | Admitting: Radiation Oncology

## 2022-04-01 ENCOUNTER — Other Ambulatory Visit: Payer: Self-pay

## 2022-04-01 DIAGNOSIS — C50812 Malignant neoplasm of overlapping sites of left female breast: Secondary | ICD-10-CM | POA: Diagnosis not present

## 2022-04-01 LAB — RAD ONC ARIA SESSION SUMMARY
Course Elapsed Days: 26
Plan Fractions Treated to Date: 3
Plan Prescribed Dose Per Fraction: 2 Gy
Plan Total Fractions Prescribed: 5
Plan Total Prescribed Dose: 10 Gy
Reference Point Dosage Given to Date: 48.56 Gy
Reference Point Session Dosage Given: 2 Gy
Session Number: 19

## 2022-04-02 ENCOUNTER — Other Ambulatory Visit: Payer: Self-pay

## 2022-04-02 ENCOUNTER — Ambulatory Visit
Admission: RE | Admit: 2022-04-02 | Discharge: 2022-04-02 | Disposition: A | Discharge status: Home / Self Care | Payer: BC Managed Care – PPO | Source: Ambulatory Visit | Attending: Radiation Oncology | Admitting: Radiation Oncology

## 2022-04-02 DIAGNOSIS — C50812 Malignant neoplasm of overlapping sites of left female breast: Secondary | ICD-10-CM | POA: Diagnosis not present

## 2022-04-02 LAB — RAD ONC ARIA SESSION SUMMARY
Course Elapsed Days: 27
Plan Fractions Treated to Date: 4
Plan Prescribed Dose Per Fraction: 2 Gy
Plan Total Fractions Prescribed: 5
Plan Total Prescribed Dose: 10 Gy
Reference Point Dosage Given to Date: 50.56 Gy
Reference Point Session Dosage Given: 2 Gy
Session Number: 20

## 2022-04-03 ENCOUNTER — Other Ambulatory Visit: Payer: Self-pay

## 2022-04-03 ENCOUNTER — Ambulatory Visit
Admission: RE | Admit: 2022-04-03 | Discharge: 2022-04-03 | Disposition: A | Discharge status: Home / Self Care | Payer: BC Managed Care – PPO | Source: Ambulatory Visit | Attending: Radiation Oncology | Admitting: Radiation Oncology

## 2022-04-03 DIAGNOSIS — C50812 Malignant neoplasm of overlapping sites of left female breast: Secondary | ICD-10-CM | POA: Diagnosis not present

## 2022-04-03 LAB — RAD ONC ARIA SESSION SUMMARY
Course Elapsed Days: 28
Plan Fractions Treated to Date: 5
Plan Prescribed Dose Per Fraction: 2 Gy
Plan Total Fractions Prescribed: 5
Plan Total Prescribed Dose: 10 Gy
Reference Point Dosage Given to Date: 52.56 Gy
Reference Point Session Dosage Given: 2 Gy
Session Number: 21

## 2022-04-11 ENCOUNTER — Inpatient Hospital Stay: Payer: BC Managed Care – PPO | Attending: Internal Medicine | Admitting: Internal Medicine

## 2022-04-11 ENCOUNTER — Encounter: Payer: Self-pay | Admitting: Internal Medicine

## 2022-04-11 VITALS — BP 141/84 | HR 82 | Temp 98.4°F | Ht 64.0 in | Wt 134.8 lb

## 2022-04-11 DIAGNOSIS — Z79811 Long term (current) use of aromatase inhibitors: Secondary | ICD-10-CM | POA: Insufficient documentation

## 2022-04-11 DIAGNOSIS — Z170 Estrogen receptor positive status [ER+]: Secondary | ICD-10-CM | POA: Diagnosis not present

## 2022-04-11 DIAGNOSIS — Z923 Personal history of irradiation: Secondary | ICD-10-CM | POA: Diagnosis not present

## 2022-04-11 DIAGNOSIS — Z79899 Other long term (current) drug therapy: Secondary | ICD-10-CM | POA: Insufficient documentation

## 2022-04-11 DIAGNOSIS — C50812 Malignant neoplasm of overlapping sites of left female breast: Secondary | ICD-10-CM

## 2022-04-11 MED ORDER — ANASTROZOLE 1 MG PO TABS: 1.0000 mg | ORAL_TABLET | Freq: Every day | ORAL | 4 refills | Status: DC

## 2022-04-11 NOTE — Assessment & Plan Note (Addendum)
#  Invasive lobular breast cancere ER;PR Positive; her 2 Negative; stage IA; low risk Oncotype.  No benefit from chemotherapy.  Patient s/p radiation. ? ?# Discussed the mechanism of action of aromatase inhibitors-with blocking of estrogen to prevent breast cancer.  Also discussed the potential side effects including but not limited to arthralgias hot flashes and increased risk of osteoporosis.  We will check bone density next visit.  Prescription for anastrozole given.  Recommend for 5 years ? ?# Genetics: S/p counseling negative for any deleterious mutations. ? ? ?# DISPOSITION:  ?# follow up in 4-6 weeks; MD: BMD prior- no labs-- Dr.B ?

## 2022-04-11 NOTE — Progress Notes (Signed)
C/o a sunburn type of pain in the left breast. ? ? ?

## 2022-04-11 NOTE — Progress Notes (Signed)
[facility] ?CONSULT NOTE ? ?Patient Care Team: ?[name] as PCP - General (Physician Assistant) ?Theodore Demark, RN as Oncology Nurse Navigator ?Cammie Sickle, MD as Consulting Physician (Oncology) ?Robert Bellow, MD as Consulting Physician (General Surgery) ? ?CHIEF COMPLAINTS/PURPOSE OF CONSULTATION: Breast cancer ? ?  ?Oncology History Overview Note  ?#Invasive lobular breast cancere ER;PR Positive; her 2 Negative; stage IA. mT [64m]; pN0; s/p lumpectomy; Oncotype -;  ? ?# IMPRESSION: ?1. A 6 mm mass in the LEFT inner breast at 9 o'clock 1 cm from the ?nipple with associated architectural distortion is indeterminate. ?Recommend ultrasound-guided biopsy for definitive characterization ?with attention on post marker placement mammogram to assess for ?mammographic/sonographic correlation. ?2. No suspicious LEFT axillary adenopathy. ? ?DIAGNOSIS:  ?A.  BREAST, LEFT 9:00 1 CM FN; ULTRASOUND-GUIDED BIOPSY:  ?- INVASIVE MAMMARY CARCINOMA WITH LOBULAR FEATURES.  ?- LOBULAR NEOPLASIA.  ? ?Size of invasive carcinoma: 8 mm in this sample  ?Histologic grade of invasive carcinoma: Grade 2  ?                     Glandular/tubular differentiation score: 3  ?                     Nuclear pleomorphism score: 2  ?                     Mitotic rate score: 1  ?                     Total score: 6  ?Ductal carcinoma in situ: Not identified  ?Lymphovascular invasion: Not identified  ? ?ER/PR/HER2: Immunohistochemistry will be performed on block A1, with  ?reflex to [place] HER[place] 2+. The results will be reported in an addendum.  ? ?Comment:  ?The definitive grade will be assigned on the excisional specimen.  ? 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  Oncotype low risk. MAY 12th, 2023- Anastrazole ?  ?Carcinoma of overlapping sites of left breast in female, estrogen receptor positive ([place]  ?02/07/2022 Initial Diagnosis  ?  Carcinoma of overlapping sites of left breast in female, estrogen receptor positive ([facility] ?  ?02/07/2022 Cancer Staging  ? Staging form: Breast, AJCC 8th Edition ?- Pathologic: Stage IA (pT1c, pN0, cM0, G2, ER+, PR+, HER2-) - Signed by BCammie Sickle MD on 02/07/2022 ?Histologic grading system: 3 grade system ? ?  ? Genetic Testing  ? Negative genetic testing. No pathogenic variants identified on the [facility] panel. The report date is 02/21/2022. ? ?The CancerNext-Expanded + RNAinsight gene panel offered by [employer] includes sequencing and rearrangement analysis for the following 77 genes: IP, ALK, APC*, ATM*, AXIN2, BAP1, BARD1, BLM, BMPR1A, BRCA1*, BRCA2*, BRIP1*, CDC73, CDH1*,CDK4, CDKN1B, CDKN2A, CHEK2*, CTNNA1, DICER1, FANCC, FH, FLCN, GALNT12, KIF1B, LZTR1, MAX, MEN1, MET, MLH1*, MSH2*, MSH3, MSH6*, MUTYH*, NBN, NF1*, NF2, NTHL1, PALB2*, PHOX2B, PMS2*, POT1, PRKAR1A, PTCH1, PTEN*, RAD51C*, RAD51D*,RB1, RECQL, RET, SDHA, SDHAF2, SDHB, SDHC, SDHD, SMAD4, SMARCA4, SMARCB1, SMARCE1, STK11, SUFU, TMEM127, TP53*,TSC1, TSC2, VHL and XRCC2 (sequencing and deletion/duplication); EGFR, EGLN1, HOXB13, KIT, MITF, PDGFRA, POLD1 and POLE (sequencing only); EPCAM and GREM1 (deletion/duplication only). ?  ? ? ? ?HISTORY OF PRESENTING ILLNESS: Patient ambulating independently.  Accompanied by husband  ? ?PChairty TomanMoyers 61y.o.  female with a history of stage I ER/PR breast cancer is here to review the results of the  Oncotype.  Patient is currently s/p radiation approximately 1 month ago. ? ?C/o a sunburn type of pain in the left breast. Patient denies any postoperative complications.  She is healing well.  No nausea no vomiting.   ? ?Review of Systems  ?Constitutional:  Negative for chills, diaphoresis, fever, malaise/fatigue and weight loss.  ?HENT:  Negative for nosebleeds and sore throat.   ?Eyes:  Negative for double vision.  ?Respiratory:  Negative for cough, hemoptysis, sputum  production, shortness of breath and wheezing.   ?Cardiovascular:  Negative for chest pain, palpitations, orthopnea and leg swelling.  ?Gastrointestinal:  Negative for abdominal pain, blood in stool, constipation, diarrhea, heartburn, melena, nausea and vomiting.  ?Genitourinary:  Negative for dysuria, frequency and urgency.  ?Musculoskeletal:  Negative for back pain and joint pain.  ?Skin: Negative.  Negative for itching and rash.  ?Neurological:  Negative for dizziness, tingling, focal weakness, weakness and headaches.  ?Endo/Heme/Allergies:  Does not bruise/bleed easily.  ?Psychiatric/Behavioral:  Negative for depression. The patient is not nervous/anxious and does not have insomnia.    ? ?MEDICAL HISTORY:  ?Past Medical History:  ?Diagnosis Date  ? ADD (attention deficit disorder)   ? Anxiety   ? Depression   ? Family history of breast cancer   ? Family history of liver cancer   ? Family history of pancreatic cancer   ? Hyperlipidemia   ? Malignant neoplasm of left breast ([place])   ? Psoriasis   ? Vitamin D deficiency   ? ? ?SURGICAL HISTORY: ?Past Surgical History:  ?Procedure Laterality Date  ? BREAST BIOPSY Left 01/08/2022  ? [place] bs, venus marker, path pending  ? BREAST LUMPECTOMY WITH SENTINEL LYMPH NODE BIOPSY Left 01/24/2022  ? Procedure: BREAST LUMPECTOMY WITH SENTINEL AXIILLARY LYMPH NODE BX;  Surgeon: Robert Bellow, MD;  Location: [facility] ORS;  Service: General;  Laterality: Left;  ? BUNIONECTOMY  12/01/2008  ? BUNIONECTOMY N/A   ? 2015  ? COLONOSCOPY    ? ? ?SOCIAL HISTORY: ?Social History  ? ?Socioeconomic History  ? Marital status: Married  ?  Spouse name: Not on file  ? Number of children: Not on file  ? Years of education: Not on file  ? Highest education level: Not on file  ?Occupational History  ? Not on file  ?Tobacco Use  ? Smoking status: Never  ? Smokeless tobacco: Never  ?Vaping Use  ? Vaping Use: Never used  ?Substance and Sexual Activity  ? Alcohol use: Not Currently  ?  Alcohol/week: 3.0  standard drinks  ?  Types: 3 Glasses of wine per week  ? Drug use: No  ? Sexual activity: Not Currently  ?Other Topics Concern  ? Not on file  ?Social History Narrative  ? Lives in [place]; with husband; son' grandson. Daughter- in [place]. 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   ? ?Social Determinants of Health  ? ?Financial Resource Strain: Not on file  ?Food Insecurity: Not on file  ?Transportation Needs: Not on file  ?Physical Activity: Not on file  ?Stress: Not on file  ?Social Connections: Not on file  ?Intimate Partner Violence: Not on file  ? ? ?FAMILY HISTORY: ?Family History  ?Problem Relation Age of Onset  ? Pancreatic cancer Mother   ? Liver cancer Father   ? Breast cancer Maternal Aunt   ? Liver cancer Maternal Grandfather   ? ? ?ALLERGIES:  has No Known Allergies. ? ?MEDICATIONS:  ?Current Outpatient Medications  ?Medication Sig Dispense Refill  ? ALPRAZolam (  XANAX) 0.25 MG tablet Take 1 tablet (0.25 mg total) by mouth daily. 30 minutes prior to Radiation Treatment 30 tablet 0  ? anastrozole (ARIMIDEX) 1 MG tablet Take 1 tablet (1 mg total) by mouth daily. 30 tablet 4  ? levothyroxine (SYNTHROID) 50 MCG tablet TAKE 1 TABLET BY MOUTH EVERY DAY 90 tablet 0  ? triamcinolone cream (KENALOG) 0.1 % APPLY TWICE A DAY TO AFFECTED AREAS (Patient taking differently: Apply 1 application. topically 2 (two) times daily as needed. APPLY TWICE A DAY TO AFFECTED AREAS) 30 g 6  ? Vitamin D, Ergocalciferol, (DRISDOL) 1.25 MG (50000 UNIT) CAPS capsule Take 1 capsule (50,000 Units total) by mouth every 7 (seven) days. 12 capsule 1  ? ZORYVE 0.3 % CREA Apply 1 application topically daily as needed (Psoriasis). (Patient not taking: Reported on 02/14/2022)    ? ?No current facility-administered medications for this visit.  ? ? ?  ?. ? ?PHYSICAL EXAMINATION: ?ECOG PERFORMANCE STATUS: 0 - Asymptomatic ? ?Vitals:  ? 04/11/22 1045  ?BP: (!) 141/84  ?Pulse: 82  ?Temp: 98.4 ?F (36.9 ?C)  ?SpO2: 98%  ? ?Filed  Weights  ? 04/11/22 1045  ?Weight: 134 lb 12.8 oz (61.1 kg)  ? ? ?Physical Exam ?Vitals and nursing note reviewed.  ?HENT:  ?   Head: Normocephalic and atraumatic.  ?   Mouth/Throat:  ?   Pharynx: Oropharynx is clear.

## 2022-05-02 ENCOUNTER — Other Ambulatory Visit: Payer: Self-pay | Admitting: Physician Assistant

## 2022-05-02 ENCOUNTER — Ambulatory Visit
Admission: RE | Admit: 2022-05-02 | Discharge: 2022-05-02 | Disposition: A | Discharge status: Home / Self Care | Payer: BC Managed Care – PPO | Source: Ambulatory Visit | Attending: Radiation Oncology | Admitting: Radiation Oncology

## 2022-05-02 VITALS — BP 146/85 | HR 89 | Resp 18 | Ht 64.0 in | Wt 137.0 lb

## 2022-05-02 DIAGNOSIS — C50812 Malignant neoplasm of overlapping sites of left female breast: Secondary | ICD-10-CM | POA: Diagnosis not present

## 2022-05-02 DIAGNOSIS — Z923 Personal history of irradiation: Secondary | ICD-10-CM | POA: Insufficient documentation

## 2022-05-02 DIAGNOSIS — Z170 Estrogen receptor positive status [ER+]: Secondary | ICD-10-CM | POA: Diagnosis not present

## 2022-05-02 DIAGNOSIS — E559 Vitamin D deficiency, unspecified: Secondary | ICD-10-CM

## 2022-05-02 DIAGNOSIS — Z79811 Long term (current) use of aromatase inhibitors: Secondary | ICD-10-CM | POA: Diagnosis not present

## 2022-05-02 NOTE — Progress Notes (Signed)
Radiation Oncology Follow up Note  Name: Tricia Rivera   Date:   05/02/2022 MRN:  [id number] DOB: September 10, 1961    This 61 y.o. female presents to the clinic today for 1 month follow-up status post whole breast radiation to her left breast for stage Ia ER/PR positive invasive lobular carcinoma.  REFERRING PROVIDER: [name], PA-C  HPI: Patient is a 61 year old female now at 1 month having completed whole breast radiation to her left breast for stage Ia ER positive invasive lobular carcinoma.  Seen today in routine follow-up she is doing well.  She specifically denies breast tenderness cough or bone pain..  She has been started on Arimidex tolerating it well without side effect.  COMPLICATIONS OF TREATMENT: none  FOLLOW UP COMPLIANCE: keeps appointments   PHYSICAL EXAM:  BP (!) 146/85   Pulse 89   Resp 18   Ht '5\' 4"'$  (1.626 m)   Wt 137 lb (62.1 kg)   BMI 23.52 kg/m  Lungs are clear to A&P cardiac examination essentially unremarkable with regular rate and rhythm. No dominant mass or nodularity is noted in either breast in 2 positions examined. Incision is well-healed. No axillary or supraclavicular adenopathy is appreciated. Cosmetic result is excellent.  Well-developed well-nourished patient in NAD. HEENT reveals PERLA, EOMI, discs not visualized.  Oral cavity is clear. No oral mucosal lesions are identified. Neck is clear without evidence of cervical or supraclavicular adenopathy. Lungs are clear to A&P. Cardiac examination is essentially unremarkable with regular rate and rhythm without murmur rub or thrill. Abdomen is benign with no organomegaly or masses noted. Motor sensory and DTR levels are equal and symmetric in the upper and lower extremities. Cranial nerves II through XII are grossly intact. Proprioception is intact. No peripheral adenopathy or edema is identified. No motor or sensory levels are noted. Crude visual fields are within normal range.  RADIOLOGY RESULTS: No current  films to review  PLAN: Present time patient is doing well with very low side effect profile from whole breast radiation and pleased with her overall progress.  Of asked to see her back in 5 months for follow-up.  Patient continues on Arimidex.  Patient is to call with any concerns.  I would like to take this opportunity to thank you for allowing me to participate in the care of your patient.Noreene Filbert, MD

## 2022-05-07 ENCOUNTER — Ambulatory Visit: Payer: BC Managed Care – PPO | Admitting: Dermatology

## 2022-05-13 ENCOUNTER — Telehealth: Payer: Self-pay | Admitting: *Deleted

## 2022-05-13 ENCOUNTER — Encounter: Payer: Self-pay | Admitting: *Deleted

## 2022-05-13 NOTE — Telephone Encounter (Signed)
Patient called stating she noticed bilateral hand swelling, left worse then right that started yesterday.  It got better overnight but started swelling again today around lunch.  She also noticed some blue "dots and lines" under the skin that looks like a child might have drew on her with marker.  She is wondering if this could be coming from the anastrazole and if she needs to come in and be seen.  Please advise.

## 2022-05-13 NOTE — Telephone Encounter (Signed)
Pt notified. She would a call to sch an appt with [facility].

## 2022-05-14 ENCOUNTER — Inpatient Hospital Stay: Payer: BC Managed Care – PPO | Attending: Internal Medicine | Admitting: Medical Oncology

## 2022-05-14 ENCOUNTER — Encounter: Payer: Self-pay | Admitting: Medical Oncology

## 2022-05-14 ENCOUNTER — Encounter: Payer: BC Managed Care – PPO | Admitting: Hospice and Palliative Medicine

## 2022-05-14 VITALS — BP 145/91 | HR 83 | Temp 97.0°F | Resp 17 | Wt 134.0 lb

## 2022-05-14 DIAGNOSIS — Z79899 Other long term (current) drug therapy: Secondary | ICD-10-CM | POA: Diagnosis not present

## 2022-05-14 DIAGNOSIS — Z170 Estrogen receptor positive status [ER+]: Secondary | ICD-10-CM | POA: Insufficient documentation

## 2022-05-14 DIAGNOSIS — Z79811 Long term (current) use of aromatase inhibitors: Secondary | ICD-10-CM | POA: Insufficient documentation

## 2022-05-14 DIAGNOSIS — C50812 Malignant neoplasm of overlapping sites of left female breast: Secondary | ICD-10-CM | POA: Diagnosis present

## 2022-05-14 DIAGNOSIS — R21 Rash and other nonspecific skin eruption: Secondary | ICD-10-CM | POA: Insufficient documentation

## 2022-05-14 DIAGNOSIS — R030 Elevated blood-pressure reading, without diagnosis of hypertension: Secondary | ICD-10-CM | POA: Diagnosis not present

## 2022-05-14 NOTE — Progress Notes (Signed)
Symptom Management [place] at [facility] [phone] [phone]) [phone]  Patient Care Team: [name], PA-C as PCP - General (Physician Assistant) Theodore Demark, RN (Inactive) as Oncology Nurse Navigator Cammie Sickle, MD as Consulting Physician (Oncology) Bary Castilla Forest Gleason, MD as Consulting Physician (General Surgery)   Name of the patient: Tricia Rivera  [id number]  19-Oct-1961   Date of visit: 05/14/22  Reason for Consult: Tricia Rivera is a 61 y.o. female who presents today for:  Rash: Fraser Din reports that she has had two incidences of a rash on her hands over the past week. Rash looks like capillaries and blue dots under the skin of her hands and fingers. Slight swelling of hands as well. Lasts for a "while" then resolves on its own. Not painful and not itchy. No other rash, fever, SOB. No new products or medications other than the anastrozole that she started 4 weeks ago. She reports that she stopped the anastrozole last night. No joint pains.    PAST MEDICAL HISTORY: Past Medical History:  Diagnosis Date   ADD (attention deficit disorder)    Anxiety    Depression    Family history of breast cancer    Family history of liver cancer    Family history of pancreatic cancer    Hyperlipidemia    Malignant neoplasm of left breast ([place])    Psoriasis    Vitamin D deficiency     PAST SURGICAL HISTORY:  Past Surgical History:  Procedure Laterality Date   BREAST BIOPSY Left 01/08/2022   [place] bs, venus marker, path pending   BREAST LUMPECTOMY WITH SENTINEL LYMPH NODE BIOPSY Left 01/24/2022   Procedure: BREAST LUMPECTOMY WITH SENTINEL AXIILLARY LYMPH NODE BX;  Surgeon: Robert Bellow, MD;  Location: [facility] ORS;  Service: General;  Laterality: Left;   BUNIONECTOMY  12/01/2008   BUNIONECTOMY N/A    2015   COLONOSCOPY      HEMATOLOGY/ONCOLOGY HISTORY:  Oncology History Overview Note  #Invasive lobular breast cancere  ER;PR Positive; her 2 Negative; stage IA. mT [76m]; pN0; s/p lumpectomy; Oncotype -;   # IMPRESSION: 1. A 6 mm mass in the LEFT inner breast at 9 o'clock 1 cm from the nipple with associated architectural distortion is indeterminate. Recommend ultrasound-guided biopsy for definitive characterization with attention on post marker placement mammogram to assess for mammographic/sonographic correlation. 2. No suspicious LEFT axillary adenopathy.  DIAGNOSIS:  A.  BREAST, LEFT 9:00 1 CM FN; ULTRASOUND-GUIDED BIOPSY:  - INVASIVE MAMMARY CARCINOMA WITH LOBULAR FEATURES.  - LOBULAR NEOPLASIA.   Size of invasive carcinoma: 8 mm in this sample  Histologic grade of invasive carcinoma: Grade 2                       Glandular/tubular differentiation score: 3                       Nuclear pleomorphism score: 2                       Mitotic rate score: 1                       Total score: 6  Ductal carcinoma in situ: Not identified  Lymphovascular invasion: Not identified   ER/PR/HER2: Immunohistochemistry will be performed on block A1, with  reflex to [place] HER2 2+. The results will be reported in an addendum.  Comment:  The definitive grade will be assigned on the excisional specimen.   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  Oncotype low risk. MAY 12th, 2023- Anastrazole   Carcinoma of overlapping sites of left breast in female, estrogen receptor positive ([place])  02/07/2022 Initial Diagnosis   Carcinoma of overlapping sites of left breast in female, estrogen receptor positive ([place])   02/07/2022 Cancer Staging   Staging form: Breast, AJCC 8th Edition - Pathologic: Stage IA (pT1c, pN0, cM0, G2, ER+, PR+, HER2-) - Signed by Cammie Sickle, MD on 02/07/2022 Histologic grading system: 3 grade system    Genetic Testing   Negative genetic testing. No pathogenic variants identified on the [facility] CancerNext-Expanded+RNA  panel. The report date is 02/21/2022.  The CancerNext-Expanded + RNAinsight gene panel offered by [employer] and includes sequencing and rearrangement analysis for the following 77 genes: IP, ALK, APC*, ATM*, AXIN2, BAP1, BARD1, BLM, BMPR1A, BRCA1*, BRCA2*, BRIP1*, CDC73, CDH1*,CDK4, CDKN1B, CDKN2A, CHEK2*, CTNNA1, DICER1, FANCC, FH, FLCN, GALNT12, KIF1B, LZTR1, MAX, MEN1, MET, MLH1*, MSH2*, MSH3, MSH6*, MUTYH*, NBN, NF1*, NF2, NTHL1, PALB2*, PHOX2B, PMS2*, POT1, PRKAR1A, PTCH1, PTEN*, RAD51C*, RAD51D*,RB1, RECQL, RET, SDHA, SDHAF2, SDHB, SDHC, SDHD, SMAD4, SMARCA4, SMARCB1, SMARCE1, STK11, SUFU, TMEM127, TP53*,TSC1, TSC2, VHL and XRCC2 (sequencing and deletion/duplication); EGFR, EGLN1, HOXB13, KIT, MITF, PDGFRA, POLD1 and POLE (sequencing only); EPCAM and GREM1 (deletion/duplication only).     ALLERGIES:  has No Known Allergies.  MEDICATIONS:  Current Outpatient Medications  Medication Sig Dispense Refill   anastrozole (ARIMIDEX) 1 MG tablet Take 1 tablet (1 mg total) by mouth daily. 30 tablet 4   Ibuprofen 200 MG CAPS Take by mouth.     levothyroxine (SYNTHROID) 50 MCG tablet TAKE 1 TABLET BY MOUTH EVERY DAY 90 tablet 0   triamcinolone cream (KENALOG) 0.1 % APPLY TWICE A DAY TO AFFECTED AREAS (Patient taking differently: Apply 1 application  topically 2 (two) times daily as needed. APPLY TWICE A DAY TO AFFECTED AREAS) 30 g 6   Vitamin D, Ergocalciferol, (DRISDOL) 1.25 MG (50000 UNIT) CAPS capsule TAKE 1 CAPSULE (50,000 UNITS TOTAL) BY MOUTH EVERY 7 (SEVEN) DAYS 12 capsule 1   ALPRAZolam (XANAX) 0.25 MG tablet Take 1 tablet (0.25 mg total) by mouth daily. 30 minutes prior to Radiation Treatment 30 tablet 0   ZORYVE 0.3 % CREA Apply 1 application topically daily as needed (Psoriasis). (Patient not taking: Reported on 05/14/2022)     No current facility-administered medications for this visit.    VITAL SIGNS: BP (!) 145/91 (Patient Position: Sitting)   Pulse 83   Temp (!) 97 F (36.1 C)  (Tympanic)   Resp 17   Wt 134 lb (60.8 kg)   SpO2 97%   BMI 23.00 kg/m  Filed Weights   05/14/22 0942  Weight: 134 lb (60.8 kg)    Estimated body mass index is 23 kg/m as calculated from the following:   Height as of 05/02/22: _0  (1.626 m).   Weight as of this encounter: 134 lb (60.8 kg).  LABS: CBC:    Component Value Date/Time   WBC 5.1 03/20/2022 1441   HGB 13.9 03/20/2022 1441   HGB 14.4 11/19/2020 0934   HCT 40.7 03/20/2022 1441   HCT 39.2 11/19/2020 0934   PLT 288 03/20/2022 1441   PLT 265 11/19/2020 0934   MCV 100.0 03/20/2022 1441   MCV 97 11/19/2020 0934   NEUTROABS 2.7 11/19/2020 0934   LYMPHSABS 1.4 11/19/2020 0934  EOSABS 0.1 11/19/2020 0934   BASOSABS 0.1 11/19/2020 0934   Comprehensive Metabolic Panel:    Component Value Date/Time   NA 141 11/21/2021 0959   K 4.5 11/21/2021 0959   CL 100 11/21/2021 0959   CO2 24 11/21/2021 0959   BUN 11 11/21/2021 0959   CREATININE 0.77 11/21/2021 0959   GLUCOSE 92 11/21/2021 0959   CALCIUM 9.9 11/21/2021 0959   AST 45 (H) 11/21/2021 0959   ALT 52 (H) 11/21/2021 0959   ALKPHOS 90 11/21/2021 0959   BILITOT 0.5 11/21/2021 0959   PROT 7.5 11/21/2021 0959   ALBUMIN 4.9 (H) 11/21/2021 0959    RADIOGRAPHIC STUDIES: No results found.  PERFORMANCE STATUS (ECOG) : 0 - Asymptomatic  Review of Systems Unless otherwise noted, a complete review of systems is negative.  Physical Exam General: NAD Cardiovascular: regular rate and rhythm Pulmonary: clear ant fields Abdomen: soft, nontender, + bowel sounds GU: no suprapubic tenderness Extremities: no edema, no joint deformities Skin: no rashes visible today Neurological: Weakness but otherwise nonfocal  Assessment and Plan- Patient is a 61 y.o. female    Encounter Diagnoses  Name Primary?   Skin rash Yes   Elevated blood pressure reading    New.  I discussed with patient that anastrozole can be linked with the rash however has a very typical presentation  which is not what she is describing today.  Patient does not have any photos or videos of the rash for me to evaluate.  The rash has resolved.  We discussed having her continue to hold the anastrozole for the next 2 weeks and that should the rash recur she will take pictures or videos of the rash for [place] to discuss at her 2-week follow-up.  We also did discuss that her blood pressure is slightly elevated today and I have recommended that she follow a low-salt diet and stay hydrated with water.  It is possible that the skin discoloration that she is describing could have something to do with her elevated blood pressure.  She will also schedule a follow-up with her PCP to discuss her blood pressure readings.  Discussed red flag signs and symptoms.  If her rash has not reoccurred within 2 weeks at her follow-up then we will discuss retrialing the anastrozole as this particular rash does not appear similar to typical anastrozole rashes versus starting a different aromatase inhibitor.    Patient expressed understanding and was in agreement with this plan. She also understands that She can call clinic at any time with any questions, concerns, or complaints.   Thank you for allowing me to participate in the care of this very pleasant patient.   Time Total: 15  Visit consisted of counseling and education dealing with the complex and emotionally intense issues of symptom management in the setting of serious illness.Greater than 50%  of this time was spent counseling and coordinating care related to the above assessment and plan.  Signed by: Nelwyn Salisbury, PA-C

## 2022-05-14 NOTE — Progress Notes (Signed)
Patient here for oncology follow-up appointment, concerns of finger swelling, purple hives on fingers for 3 days and slight diarrhea

## 2022-05-15 ENCOUNTER — Ambulatory Visit (INDEPENDENT_AMBULATORY_CARE_PROVIDER_SITE_OTHER): Payer: BC Managed Care – PPO | Admitting: Physician Assistant

## 2022-05-15 ENCOUNTER — Encounter: Payer: Self-pay | Admitting: Internal Medicine

## 2022-05-15 ENCOUNTER — Encounter: Payer: Self-pay | Admitting: Physician Assistant

## 2022-05-15 VITALS — BP 127/85 | HR 78 | Ht 64.0 in | Wt 135.1 lb

## 2022-05-15 DIAGNOSIS — E7800 Pure hypercholesterolemia, unspecified: Secondary | ICD-10-CM | POA: Diagnosis not present

## 2022-05-15 DIAGNOSIS — E034 Atrophy of thyroid (acquired): Secondary | ICD-10-CM | POA: Diagnosis not present

## 2022-05-15 DIAGNOSIS — R030 Elevated blood-pressure reading, without diagnosis of hypertension: Secondary | ICD-10-CM | POA: Diagnosis not present

## 2022-05-15 DIAGNOSIS — R21 Rash and other nonspecific skin eruption: Secondary | ICD-10-CM | POA: Diagnosis not present

## 2022-05-15 NOTE — Assessment & Plan Note (Addendum)
May have been secondary to anastrozole use?, we will monitor for recurrence, advised pt to send photos of rash to oncologist. Appears vascular in nature, unusual presentation of raynaud's ? Will check labs

## 2022-05-15 NOTE — Assessment & Plan Note (Addendum)
Unmedicated, we will recheck lipid panel  The 10-year ASCVD risk score (Arnett DK, et al., 2019) is: 3.9%

## 2022-05-15 NOTE — Telephone Encounter (Signed)
Sarah-I agree with the plan to hold anastrozole for 2 weeks.  However at next assessment based on patient's concerns-I think it is reasonable to switch over to letrozole/femara.  Thanks GB

## 2022-05-15 NOTE — Assessment & Plan Note (Signed)
In the cancer center; normal in office today, normal at home for patient Advised she continue to monitor at home and message/call if elevated > 130/90

## 2022-05-15 NOTE — Progress Notes (Signed)
I,Tricia Rivera,acting as a Education administrator for [employer], PA-C.,have documented all relevant documentation on the behalf of Tricia Kirschner, PA-C,as directed by  Tricia Kirschner, PA-C while in the presence of Tricia Kirschner, PA-C.   Established patient visit   Patient: Tricia Rivera   DOB: 26-Nov-1961   61 y.o. Female  MRN: [id number] Visit Date: 05/15/2022  Today's healthcare provider: [name], PA-C   Cc. rash  Subjective    HPI   Tricia Rivera is a 61 y/o female who presents today with a new onset rash < 1 week ago . Tips of fingers, a purple/redish color. She attributed this to her anastrozole, which she had taken for 4 weeks, and stopped the medication. The rash resolved. Denies any pruritus, pain. Rash has not recurred.   Her blood pressure has been elevated in office at the cancer center. Reports normal values at home.   Medications: Outpatient Medications Prior to Visit  Medication Sig   ALPRAZolam (XANAX) 0.25 MG tablet Take 1 tablet (0.25 mg total) by mouth daily. 30 minutes prior to Radiation Treatment   Ibuprofen 200 MG CAPS Take by mouth.   levothyroxine (SYNTHROID) 50 MCG tablet TAKE 1 TABLET BY MOUTH EVERY DAY   triamcinolone cream (KENALOG) 0.1 % APPLY TWICE A DAY TO AFFECTED AREAS (Patient taking differently: Apply 1 application  topically 2 (two) times daily as needed. APPLY TWICE A DAY TO AFFECTED AREAS)   Vitamin D, Ergocalciferol, (DRISDOL) 1.25 MG (50000 UNIT) CAPS capsule TAKE 1 CAPSULE (50,000 UNITS TOTAL) BY MOUTH EVERY 7 (SEVEN) DAYS   ZORYVE 0.3 % CREA Apply 1 application  topically daily as needed (Psoriasis).   anastrozole (ARIMIDEX) 1 MG tablet Take 1 tablet (1 mg total) by mouth daily. (Patient not taking: Reported on 05/15/2022)   No facility-administered medications prior to visit.    Review of Systems  Constitutional:  Negative for fatigue and fever.  Respiratory:  Negative for cough and shortness of breath.   Cardiovascular:  Negative for chest  pain and leg swelling.  Gastrointestinal:  Negative for abdominal pain.  Neurological:  Negative for dizziness and headaches.      Objective    BP 127/85   Pulse 78   Ht '5\' 4"'$  (1.626 m)   Wt 135 lb 1.6 oz (61.3 kg)   SpO2 99%   BMI 23.19 kg/m  BP Readings from Last 3 Encounters:  05/15/22 127/85  05/14/22 (!) 145/91  05/02/22 (!) 146/85      Physical Exam Vitals reviewed.  Constitutional:      Appearance: She is not ill-appearing.  HENT:     Head: Normocephalic.  Eyes:     Conjunctiva/sclera: Conjunctivae normal.  Cardiovascular:     Rate and Rhythm: Normal rate.  Pulmonary:     Effort: Pulmonary effort is normal. No respiratory distress.  Skin:    Comments: No apparent rash or discoloration to hands  Neurological:     General: No focal deficit present.     Mental Status: She is alert and oriented to person, place, and time.  Psychiatric:        Mood and Affect: Mood normal.        Behavior: Behavior normal.      No results found for any visits on 05/15/22.  Assessment & Plan     Problem List Items Addressed This Visit       Endocrine   Hypothyroidism due to acquired atrophy of thyroid    Last TSH was elevated,  will recheck tsh/t4 today       Relevant Orders   TSH + free T4     Musculoskeletal and Integument   Rash - Primary    May have been secondary to anastrozole use?, we will monitor for recurrence, advised pt to send photos of rash to oncologist. Appears vascular in nature, unusual presentation of raynaud's ? Will check labs       Relevant Orders   CBC w/Diff/Platelet   PT and PTT     Other   Hypercholesteremia    Unmedicated, we will recheck lipid panel  The 10-year ASCVD risk score (Arnett DK, et al., 2019) is: 3.9%       Relevant Orders   Lipid Profile   Comprehensive Metabolic Panel (CMET)   Elevated BP without diagnosis of hypertension    In the cancer center; normal in office today, normal at home for patient Advised she  continue to monitor at home and message/call if elevated > 130/90        Return in about 6 months (around 11/14/2022) for CPE.      I, Tricia Kirschner, PA-C have reviewed all documentation for this visit. The documentation on  05/15/2022 for the exam, diagnosis, procedures, and orders are all accurate and complete.  Tricia Kirschner, PA-C [facility] [address] [place], [place], [address] Office: [phone] Fax: [place]

## 2022-05-15 NOTE — Assessment & Plan Note (Signed)
Last TSH was elevated, will recheck tsh/t4 today

## 2022-05-16 LAB — CBC WITH DIFFERENTIAL/PLATELET
Basophils Absolute: 0 10*3/uL (ref 0.0–0.2)
Basos: 1 %
EOS (ABSOLUTE): 0.1 10*3/uL (ref 0.0–0.4)
Eos: 2 %
Hematocrit: 40.7 % (ref 34.0–46.6)
Hemoglobin: 14.4 g/dL (ref 11.1–15.9)
Immature Grans (Abs): 0 10*3/uL (ref 0.0–0.1)
Immature Granulocytes: 0 %
Lymphocytes Absolute: 0.9 10*3/uL (ref 0.7–3.1)
Lymphs: 29 %
MCH: 35.4 pg — ABNORMAL HIGH (ref 26.6–33.0)
MCHC: 35.4 g/dL (ref 31.5–35.7)
MCV: 100 fL — ABNORMAL HIGH (ref 79–97)
Monocytes Absolute: 0.4 10*3/uL (ref 0.1–0.9)
Monocytes: 13 %
Neutrophils Absolute: 1.7 10*3/uL (ref 1.4–7.0)
Neutrophils: 55 %
Platelets: 272 10*3/uL (ref 150–450)
RBC: 4.07 x10E6/uL (ref 3.77–5.28)
RDW: 12.1 % (ref 11.7–15.4)
WBC: 3.1 10*3/uL — ABNORMAL LOW (ref 3.4–10.8)

## 2022-05-16 LAB — COMPREHENSIVE METABOLIC PANEL
ALT: 76 IU/L — ABNORMAL HIGH (ref 0–32)
AST: 65 IU/L — ABNORMAL HIGH (ref 0–40)
Albumin/Globulin Ratio: 2 (ref 1.2–2.2)
Albumin: 4.5 g/dL (ref 3.8–4.8)
Alkaline Phosphatase: 68 IU/L (ref 44–121)
BUN/Creatinine Ratio: 15 (ref 12–28)
BUN: 11 mg/dL (ref 8–27)
Bilirubin Total: 0.4 mg/dL (ref 0.0–1.2)
CO2: 23 mmol/L (ref 20–29)
Calcium: 9.9 mg/dL (ref 8.7–10.3)
Chloride: 106 mmol/L (ref 96–106)
Creatinine, Ser: 0.73 mg/dL (ref 0.57–1.00)
Globulin, Total: 2.3 g/dL (ref 1.5–4.5)
Glucose: 102 mg/dL — ABNORMAL HIGH (ref 70–99)
Potassium: 4.6 mmol/L (ref 3.5–5.2)
Sodium: 145 mmol/L — ABNORMAL HIGH (ref 134–144)
Total Protein: 6.8 g/dL (ref 6.0–8.5)
eGFR: 92 mL/min/{1.73_m2} (ref >59)

## 2022-05-16 LAB — PT AND PTT
INR: 0.9 (ref 0.9–1.2)
Prothrombin Time: 10 s (ref 9.1–12.0)
aPTT: 26 s (ref 24–33)

## 2022-05-16 LAB — TSH+FREE T4
Free T4: 1.1 ng/dL (ref 0.82–1.77)
TSH: 3.05 u[IU]/mL (ref 0.450–4.500)

## 2022-05-16 LAB — LIPID PANEL
Chol/HDL Ratio: 2.9 ratio (ref 0.0–4.4)
Cholesterol, Total: 268 mg/dL — ABNORMAL HIGH (ref 100–199)
HDL: 94 mg/dL (ref >39)
LDL Chol Calc (NIH): 162 mg/dL — ABNORMAL HIGH (ref 0–99)
Triglycerides: 74 mg/dL (ref 0–149)
VLDL Cholesterol Cal: 12 mg/dL (ref 5–40)

## 2022-05-20 ENCOUNTER — Telehealth: Payer: Self-pay

## 2022-05-20 NOTE — Telephone Encounter (Signed)
Pt returned our call. Shared provider's note with pt. Pt is interested in going to a chiropractor for back pain relief.  We discussed TENS unit therapy. Please order labs for pt in 3 months per note.  Pt would like appt reminder for labs sent via [facility].  [name], PA-C  05/19/2022  3:12 PM EDT     Pam--   Cholesterol is up. Your 10 year risk of heart attack/stroke is still low, but creeping up. We will continue to keep an eye on it. I'll send some information on diet.    Your liver enzymes are still slightly elevated, we were blaming this on diet for quite a while. Lets recheck in 3 months and if they are still elevated I may want to proceed with some additional tests to make sure nothing else is going on.   Blood clotting related testing we did for your rash is all normal.  Thyroid is normal.   [name], PA-C [facility] [address] [place], [place], [address] Office: [phone] Fax: [phone]

## 2022-05-28 ENCOUNTER — Inpatient Hospital Stay (HOSPITAL_BASED_OUTPATIENT_CLINIC_OR_DEPARTMENT_OTHER): Payer: BC Managed Care – PPO | Admitting: Medical Oncology

## 2022-05-28 VITALS — BP 127/78 | HR 79 | Temp 98.7°F | Resp 16

## 2022-05-28 DIAGNOSIS — R21 Rash and other nonspecific skin eruption: Secondary | ICD-10-CM | POA: Diagnosis not present

## 2022-05-28 DIAGNOSIS — R030 Elevated blood-pressure reading, without diagnosis of hypertension: Secondary | ICD-10-CM | POA: Diagnosis not present

## 2022-05-28 DIAGNOSIS — Z170 Estrogen receptor positive status [ER+]: Secondary | ICD-10-CM | POA: Diagnosis not present

## 2022-05-28 DIAGNOSIS — C50812 Malignant neoplasm of overlapping sites of left female breast: Secondary | ICD-10-CM | POA: Diagnosis not present

## 2022-05-28 MED ORDER — LETROZOLE 2.5 MG PO TABS: 2.5000 mg | ORAL_TABLET | Freq: Every day | ORAL | 3 refills | Status: DC

## 2022-05-28 NOTE — Progress Notes (Signed)
[facility] [place] NOTE  Patient Care Team: [name], PA-C as PCP - General (Physician Assistant) Theodore Demark, RN (Inactive) as Oncology Nurse Navigator Cammie Sickle, MD as Consulting Physician (Oncology) Bary Castilla Forest Gleason, MD as Consulting Physician (General Surgery)  CHIEF COMPLAINTS/PURPOSE OF CONSULTATION: Breast cancer    Oncology History Overview Note  #Invasive lobular breast cancere ER;PR Positive; her 2 Negative; stage IA. mT [74m]; pN0; s/p lumpectomy; Oncotype -;   # IMPRESSION: 1. A 6 mm mass in the LEFT inner breast at 9 o'clock 1 cm from the nipple with associated architectural distortion is indeterminate. Recommend ultrasound-guided biopsy for definitive characterization with attention on post marker placement mammogram to assess for mammographic/sonographic correlation. 2. No suspicious LEFT axillary adenopathy.  DIAGNOSIS:  A.  BREAST, LEFT 9:00 1 CM FN; ULTRASOUND-GUIDED BIOPSY:  - INVASIVE MAMMARY CARCINOMA WITH LOBULAR FEATURES.  - LOBULAR NEOPLASIA.   Size of invasive carcinoma: 8 mm in this sample  Histologic grade of invasive carcinoma: Grade 2                       Glandular/tubular differentiation score: 3                       Nuclear pleomorphism score: 2                       Mitotic rate score: 1                       Total score: 6  Ductal carcinoma in situ: Not identified  Lymphovascular invasion: Not identified   ER/PR/HER2: Immunohistochemistry will be performed on block A1, with  reflex to FCathedralfor HER2 2+. The results will be reported in an addendum.   Comment:  The definitive grade will be assigned on the excisional specimen.   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  Oncotype low risk. MAY 12th, 2023- Anastrazole   Carcinoma of overlapping sites of left breast in female, estrogen receptor positive ([place]  02/07/2022 Initial Diagnosis    Carcinoma of overlapping sites of left breast in female, estrogen receptor positive ([facility]   02/07/2022 Cancer Staging   Staging form: Breast, AJCC 8th Edition - Pathologic: Stage IA (pT1c, pN0, cM0, G2, ER+, PR+, HER2-) - Signed by BCammie Sickle MD on 02/07/2022 Histologic grading system: 3 grade system    Genetic Testing   Negative genetic testing. No pathogenic variants identified on the [facility] panel. The report date is 02/21/2022.  The CancerNext-Expanded + RNAinsight gene panel offered by [employer] includes sequencing and rearrangement analysis for the following 77 genes: IP, ALK, APC*, ATM*, AXIN2, BAP1, BARD1, BLM, BMPR1A, BRCA1*, BRCA2*, BRIP1*, CDC73, CDH1*,CDK4, CDKN1B, CDKN2A, CHEK2*, CTNNA1, DICER1, FANCC, FH, FLCN, GALNT12, KIF1B, LZTR1, MAX, MEN1, MET, MLH1*, MSH2*, MSH3, MSH6*, MUTYH*, NBN, NF1*, NF2, NTHL1, PALB2*, PHOX2B, PMS2*, POT1, PRKAR1A, PTCH1, PTEN*, RAD51C*, RAD51D*,RB1, RECQL, RET, SDHA, SDHAF2, SDHB, SDHC, SDHD, SMAD4, SMARCA4, SMARCB1, SMARCE1, STK11, SUFU, TMEM127, TP53*,TSC1, TSC2, VHL and XRCC2 (sequencing and deletion/duplication); EGFR, EGLN1, HOXB13, KIT, MITF, PDGFRA, POLD1 and POLE (sequencing only); EPCAM and GREM1 (deletion/duplication only).      HISTORY OF PRESENTING ILLNESS: Patient ambulating independently.   PArelly WhittenbergMoyers 61y.o.  female with a history of stage I ER/PR breast cancer is here for follow up on rash:  Rash: Began weeks after  starting Anastrozole. Red rash of the hands. Stopped the Anastrozole and and she returns today for follow up. She states that the rash has fully resolved and has not recurred. She is interested in starting either the the anastrozole or a different medication again. She tolerated the Anastrozole well other than the rash. AT her last visit her BP was a bit elevated as well. She stopped ibuprofen use and this has resolved.   Review of Systems  Constitutional:  Negative for  chills, diaphoresis, fever, malaise/fatigue and weight loss.  HENT:  Negative for nosebleeds and sore throat.   Eyes:  Negative for double vision.  Respiratory:  Negative for cough, hemoptysis, sputum production, shortness of breath and wheezing.   Cardiovascular:  Negative for chest pain, palpitations, orthopnea and leg swelling.  Gastrointestinal:  Negative for abdominal pain, blood in stool, constipation, diarrhea, heartburn, melena, nausea and vomiting.  Genitourinary:  Negative for dysuria, frequency and urgency.  Musculoskeletal:  Negative for back pain and joint pain.  Skin: Negative.  Negative for itching and rash.  Neurological:  Negative for dizziness, tingling, focal weakness, weakness and headaches.  Endo/Heme/Allergies:  Does not bruise/bleed easily.  Psychiatric/Behavioral:  Negative for depression. The patient is not nervous/anxious and does not have insomnia.      MEDICAL HISTORY:  Past Medical History:  Diagnosis Date   ADD (attention deficit disorder)    Anxiety    Depression    Family history of breast cancer    Family history of liver cancer    Family history of pancreatic cancer    Hyperlipidemia    Malignant neoplasm of left breast ([place])    Psoriasis    Vitamin D deficiency     SURGICAL HISTORY: Past Surgical History:  Procedure Laterality Date   BREAST BIOPSY Left 01/08/2022   [place] bs, venus marker, path pending   BREAST LUMPECTOMY WITH SENTINEL LYMPH NODE BIOPSY Left 01/24/2022   Procedure: BREAST LUMPECTOMY WITH SENTINEL AXIILLARY LYMPH NODE BX;  Surgeon: Robert Bellow, MD;  Location: [facility] ORS;  Service: General;  Laterality: Left;   BUNIONECTOMY  12/01/2008   BUNIONECTOMY N/A    2015   COLONOSCOPY      SOCIAL HISTORY: Social History   Socioeconomic History   Marital status: Married    Spouse name: Not on file   Number of children: Not on file   Years of education: Not on file   Highest education level: Not on file  Occupational History    Not on file  Tobacco Use   Smoking status: Never   Smokeless tobacco: Never  Vaping Use   Vaping Use: Never used  Substance and Sexual Activity   Alcohol use: Not Currently    Alcohol/week: 3.0 standard drinks of alcohol    Types: 3 Glasses of wine per week   Drug use: No   Sexual activity: Not Currently  Other Topics Concern   Not on file  Social History Narrative   Lives in [place]; with husband; son' grandson. Daughter- in [place]. 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    Social Determinants of Health   Financial Resource Strain: Not on file  Food Insecurity: Not on file  Transportation Needs: Not on file  Physical Activity: Not on file  Stress: Not on file  Social Connections: Not on file  Intimate Partner Violence: Not on file    FAMILY HISTORY: Family History  Problem Relation Age of Onset   Pancreatic cancer Mother    Liver cancer Father  Breast cancer Maternal Aunt    Liver cancer Maternal Grandfather     ALLERGIES:  has No Known Allergies.  MEDICATIONS:  Current Outpatient Medications  Medication Sig Dispense Refill   letrozole (FEMARA) 2.5 MG tablet Take 1 tablet (2.5 mg total) by mouth daily. 90 tablet 3   ALPRAZolam (XANAX) 0.25 MG tablet Take 1 tablet (0.25 mg total) by mouth daily. 30 minutes prior to Radiation Treatment 30 tablet 0   Ibuprofen 200 MG CAPS Take by mouth.     levothyroxine (SYNTHROID) 50 MCG tablet TAKE 1 TABLET BY MOUTH EVERY DAY 90 tablet 0   triamcinolone cream (KENALOG) 0.1 % APPLY TWICE A DAY TO AFFECTED AREAS (Patient taking differently: Apply 1 application  topically 2 (two) times daily as needed. APPLY TWICE A DAY TO AFFECTED AREAS) 30 g 6   Vitamin D, Ergocalciferol, (DRISDOL) 1.25 MG (50000 UNIT) CAPS capsule TAKE 1 CAPSULE (50,000 UNITS TOTAL) BY MOUTH EVERY 7 (SEVEN) DAYS 12 capsule 1   ZORYVE 0.3 % CREA Apply 1 application  topically daily as needed (Psoriasis).     No current facility-administered  medications for this visit.      Marland Kitchen  PHYSICAL EXAMINATION: ECOG PERFORMANCE STATUS: 0 - Asymptomatic  Vitals:   05/28/22 1015  BP: 127/78  Pulse: 79  Resp: 16  Temp: 98.7 F (37.1 C)   There were no vitals filed for this visit.   Physical Exam Vitals and nursing note reviewed.  HENT:     Head: Normocephalic and atraumatic.  Skin:    General: Skin is warm.  Neurological:     General: No focal deficit present.     Mental Status: She is alert and oriented to person, place, and time.  Psychiatric:        Behavior: Behavior normal.        Judgment: Judgment normal.     LABORATORY DATA:  I have reviewed the data as listed Lab Results  Component Value Date   WBC 3.1 (L) 05/15/2022   HGB 14.4 05/15/2022   HCT 40.7 05/15/2022   MCV 100 (H) 05/15/2022   PLT 272 05/15/2022   Recent Labs    11/21/21 0959 05/15/22 1002  NA 141 145*  K 4.5 4.6  CL 100 106  CO2 24 23  GLUCOSE 92 102*  BUN 11 11  CREATININE 0.77 0.73  CALCIUM 9.9 9.9  PROT 7.5 6.8  ALBUMIN 4.9* 4.5  AST 45* 65*  ALT 52* 76*  ALKPHOS 90 68  BILITOT 0.5 0.4    RADIOGRAPHIC STUDIES: I have personally reviewed the radiological images as listed and agreed with the findings in the report. No results found.  ASSESSMENT & PLAN:   No problem-specific Assessment & Plan notes found for this encounter.  Encounter Diagnoses  Name Primary?   Skin rash Yes   Elevated blood pressure reading    Carcinoma of overlapping sites of left breast in female, estrogen receptor positive (HCC)     Rash has resolved. Due to suggestion that this was related to her Anastrozole we are switching her to Letrozole. Discussed and reviewed common potential side effects. Continue PCP follow up. Has DEXA scan and follow up with Dr. B within the next 2 weeks.   All questions were answered. The patient/family knows to call the clinic with any problems, questions or concerns.       Hughie Closs, PA-C 05/28/2022 10:46  AM

## 2022-05-28 NOTE — Progress Notes (Signed)
Pt here for follow-up. Reports no further rash. Reports that she followed-up with her PCP for BP, and BP was normal at that visit.

## 2022-05-29 DIAGNOSIS — Z170 Estrogen receptor positive status [ER+]: Secondary | ICD-10-CM

## 2022-05-29 NOTE — Progress Notes (Signed)
Survivorship Care Plan visit completed.  Treatment summary reviewed and given to patient.  ASCO answers booklet reviewed and given to patient.  CARE program and Cancer Transitions discussed with patient along with other resources cancer center offers to patients and caregivers.  Patient verbalized understanding.    

## 2022-06-04 ENCOUNTER — Ambulatory Visit
Admission: RE | Admit: 2022-06-04 | Discharge: 2022-06-04 | Disposition: A | Discharge status: Home / Self Care | Payer: BC Managed Care – PPO | Source: Ambulatory Visit | Attending: Internal Medicine | Admitting: Internal Medicine

## 2022-06-04 DIAGNOSIS — C50812 Malignant neoplasm of overlapping sites of left female breast: Secondary | ICD-10-CM | POA: Insufficient documentation

## 2022-06-04 DIAGNOSIS — Z170 Estrogen receptor positive status [ER+]: Secondary | ICD-10-CM | POA: Diagnosis present

## 2022-06-06 ENCOUNTER — Inpatient Hospital Stay: Payer: BC Managed Care – PPO | Attending: Internal Medicine | Admitting: Internal Medicine

## 2022-06-06 ENCOUNTER — Encounter: Payer: Self-pay | Admitting: Internal Medicine

## 2022-06-06 DIAGNOSIS — Z923 Personal history of irradiation: Secondary | ICD-10-CM | POA: Insufficient documentation

## 2022-06-06 DIAGNOSIS — Z780 Asymptomatic menopausal state: Secondary | ICD-10-CM

## 2022-06-06 DIAGNOSIS — M8580 Other specified disorders of bone density and structure, unspecified site: Secondary | ICD-10-CM | POA: Diagnosis not present

## 2022-06-06 DIAGNOSIS — Z79899 Other long term (current) drug therapy: Secondary | ICD-10-CM | POA: Insufficient documentation

## 2022-06-06 DIAGNOSIS — C50812 Malignant neoplasm of overlapping sites of left female breast: Secondary | ICD-10-CM | POA: Insufficient documentation

## 2022-06-06 DIAGNOSIS — Z79811 Long term (current) use of aromatase inhibitors: Secondary | ICD-10-CM | POA: Insufficient documentation

## 2022-06-06 DIAGNOSIS — Z170 Estrogen receptor positive status [ER+]: Secondary | ICD-10-CM | POA: Diagnosis not present

## 2022-06-06 NOTE — Assessment & Plan Note (Addendum)
#   MARCH 2023- invasive lobular breast cancer ER;PR Positive; her 2 Negative; stage IA; low risk Oncotype.  No benefit from chemotherapy.  Patient s/p radiation. currently on letrozole [intol to anastrozole; since mid June 2023]  #Tolerating letrozole fairly well.  Continue letrozole at this time.  # Teeth aching: Unlikely any side effect of letrozole.  Monitor for now.  If worse recommend evaluation with dentistry.  #Osteopenia: [June 2023]- The BMD measured at Femur Neck Left  T-score of -2.1.  I discussed at length the potential risk factors for osteoporosis- age/gender/postmenopausal status/use of anti-estrogen treatments. Discussed multiple options including exercise/ calcium and vitamin D supplementation/ and also use of bisphosphonates. Discussed oral bisphosphonates versus parenteral bisphosphonate like Reclast.  Discussed the potential benefits and/side effects  Including but not limited to Osteonecrosis of jaw/ hypocalcemia.  Patient interested in IV infusion rather than taking a pill; ordered reclast for next visit.  [facility]  # DISPOSITION:  # follow up in 3 months MD: labs- cbc/cmp; vit D 25-OH; possible reclast- Dr.B

## 2022-06-06 NOTE — Progress Notes (Signed)
Patient reports aches in left shoulder since taking the Letrozole.  Also started having lower teeth aching last night with dull pain today 2/10 pain scale.  Not sure if the shoulder and teeth pain is related to the Letrozole.

## 2022-06-06 NOTE — Progress Notes (Signed)
[facility] [name] NOTE  Patient Care Team: Tricia Kirschner, PA-C as PCP - General (Physician Assistant) Tricia Demark, RN (Inactive) as Oncology Nurse Navigator Tricia Sickle, MD as Consulting Physician (Oncology) Tricia Castilla [place] Gleason, MD as Consulting Physician (General Surgery) Tricia Filbert, MD as Consulting Physician (Radiation Oncology)  CHIEF COMPLAINTS/PURPOSE OF CONSULTATION: Breast cancer    Oncology History Overview Note  #Invasive lobular breast cancere ER;PR Positive; her 2 Negative; stage IA. mT [93m]; pN0; s/p lumpectomy; Oncotype -;   # IMPRESSION: 1. A 6 mm mass in the LEFT inner breast at 9 o'clock 1 cm from the nipple with associated architectural distortion is indeterminate. Recommend ultrasound-guided biopsy for definitive characterization with attention on post marker placement mammogram to assess for mammographic/sonographic correlation. 2. No suspicious LEFT axillary adenopathy.  DIAGNOSIS:  A.  BREAST, LEFT 9:00 1 CM FN; ULTRASOUND-GUIDED BIOPSY:  - INVASIVE MAMMARY CARCINOMA WITH LOBULAR FEATURES.  - LOBULAR NEOPLASIA.   Size of invasive carcinoma: 8 mm in this sample  Histologic grade of invasive carcinoma: Grade 2                       Glandular/tubular differentiation score: 3                       Nuclear pleomorphism score: 2                       Mitotic rate score: 1                       Total score: 6  Ductal carcinoma in situ: Not identified  Lymphovascular invasion: Not identified   ER/PR/HER2: Immunohistochemistry will be performed on block A1, with  reflex to [place] HER[place] 2+. The results will be reported in an addendum.   Comment:  The definitive grade will be assigned on the excisional specimen.   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  Oncotype low risk. MAY 12th, 2023- Anastrazole.[Stopped sec to MSK]  # MID JUNE 2023- LETROZOLE    Carcinoma of overlapping sites of left breast in female, estrogen receptor positive ([place]  02/07/2022 Initial Diagnosis   Carcinoma of overlapping sites of left breast in female, estrogen receptor positive ([place]   02/07/2022 Cancer Staging   Staging form: Breast, AJCC 8th Edition - Pathologic: Stage IA (pT1c, pN0, cM0, G2, ER+, PR+, HER2-) - Signed by BCammie Sickle MD on 02/07/2022 Histologic grading system: 3 grade system    Genetic Testing   Negative genetic testing. No pathogenic variants identified on the [facility] panel. The report date is 02/21/2022.  The CancerNext-Expanded + RNAinsight gene panel offered by APulte Rivera includes sequencing and rearrangement analysis for the following 77 genes: IP, ALK, APC*, ATM*, AXIN2, BAP1, BARD1, BLM, BMPR1A, BRCA1*, BRCA2*, BRIP1*, CDC73, CDH1*,CDK4, CDKN1B, CDKN2A, CHEK2*, CTNNA1, DICER1, FANCC, FH, FLCN, GALNT12, KIF1B, LZTR1, MAX, MEN1, MET, MLH1*, MSH2*, MSH3, MSH6*, MUTYH*, NBN, NF1*, NF2, NTHL1, PALB2*, PHOX2B, PMS2*, POT1, PRKAR1A, PTCH1, PTEN*, RAD51C*, RAD51D*,RB1, RECQL, RET, SDHA, SDHAF2, SDHB, SDHC, SDHD, SMAD4, SMARCA4, SMARCB1, SMARCE1, STK11, SUFU, TMEM127, TP53*,TSC1, TSC2, VHL and XRCC2 (sequencing and deletion/duplication); EGFR, EGLN1, HOXB13, KIT, MITF, PDGFRA, POLD1 and POLE (sequencing only); EPCAM and GREM1 (deletion/duplication only).     HISTORY OF PRESENTING ILLNESS: Patient ambulating independently.  Alone.  PKielee Rivera 61y.o.  female with a history of  stage I ER/PR breast cancer currently on adjuvant AI is here for follow-up/review results of the BMD  Patient was taken off anastrozole in early June 2023 because of significant musculoskeletal pain.  She was started on letrozole mid June 2023.  Tolerating well however noted to have dull achy pain of the teeth bilaterally  Otherwise no worsening joint pains or back pain.  Review of Systems  Constitutional:  Negative for chills,  diaphoresis, fever, malaise/fatigue and weight loss.  HENT:  Negative for nosebleeds and sore throat.   Eyes:  Negative for double vision.  Respiratory:  Negative for cough, hemoptysis, sputum production, shortness of breath and wheezing.   Cardiovascular:  Negative for chest pain, palpitations, orthopnea and leg swelling.  Gastrointestinal:  Negative for abdominal pain, blood in stool, constipation, diarrhea, heartburn, melena, nausea and vomiting.  Genitourinary:  Negative for dysuria, frequency and urgency.  Musculoskeletal:  Negative for back pain and joint pain.  Skin: Negative.  Negative for itching and rash.  Neurological:  Negative for dizziness, tingling, focal weakness, weakness and headaches.  Endo/Heme/Allergies:  Does not bruise/bleed easily.  Psychiatric/Behavioral:  Negative for depression. The patient is not nervous/anxious and does not have insomnia.      MEDICAL HISTORY:  Past Medical History:  Diagnosis Date   ADD (attention deficit disorder)    Anxiety    Depression    Family history of breast cancer    Family history of liver cancer    Family history of pancreatic cancer    Hyperlipidemia    Malignant neoplasm of left breast ([place])    Psoriasis    Vitamin D deficiency     SURGICAL HISTORY: Past Surgical History:  Procedure Laterality Date   BREAST BIOPSY Left 01/08/2022   [place] bs, venus marker, invasive mammary   BREAST LUMPECTOMY WITH SENTINEL LYMPH NODE BIOPSY Left 01/24/2022   Procedure: BREAST LUMPECTOMY WITH SENTINEL AXIILLARY LYMPH NODE BX;  Surgeon: Robert Bellow, MD;  Location: [facility] ORS;  Service: General;  Laterality: Left;   BUNIONECTOMY  12/01/2008   BUNIONECTOMY N/A    2015   COLONOSCOPY      SOCIAL HISTORY: Social History   Socioeconomic History   Marital status: Married    Spouse name: Not on file   Number of children: Not on file   Years of education: Not on file   Highest education level: Not on file  Occupational History    Not on file  Tobacco Use   Smoking status: Never   Smokeless tobacco: Never  Vaping Use   Vaping Use: Never used  Substance and Sexual Activity   Alcohol use: Not Currently    Alcohol/week: 3.0 standard drinks of alcohol    Types: 3 Glasses of wine per week   Drug use: No   Sexual activity: Not Currently  Other Topics Concern   Not on file  Social History Narrative   Lives in [place]; with husband; son' grandson. Daughter- in [place]. 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    Social Determinants of Health   Financial Resource Strain: Not on file  Food Insecurity: Not on file  Transportation Needs: Not on file  Physical Activity: Not on file  Stress: Not on file  Social Connections: Not on file  Intimate Partner Violence: Not on file    FAMILY HISTORY: Family History  Problem Relation Age of Onset   Pancreatic cancer Mother    Liver cancer Father    Breast cancer Maternal Aunt  Liver cancer Maternal Grandfather     ALLERGIES:  has No Known Allergies.  MEDICATIONS:  Current Outpatient Medications  Medication Sig Dispense Refill   Ibuprofen 200 MG CAPS Take by mouth.     letrozole (FEMARA) 2.5 MG tablet Take 1 tablet (2.5 mg total) by mouth daily. 90 tablet 3   levothyroxine (SYNTHROID) 50 MCG tablet TAKE 1 TABLET BY MOUTH EVERY DAY 90 tablet 0   triamcinolone cream (KENALOG) 0.1 % APPLY TWICE A DAY TO AFFECTED AREAS (Patient taking differently: Apply 1 application  topically 2 (two) times daily as needed. APPLY TWICE A DAY TO AFFECTED AREAS) 30 g 6   Vitamin D, Ergocalciferol, (DRISDOL) 1.25 MG (50000 UNIT) CAPS capsule TAKE 1 CAPSULE (50,000 UNITS TOTAL) BY MOUTH EVERY 7 (SEVEN) DAYS 12 capsule 1   ZORYVE 0.3 % CREA Apply 1 application  topically daily as needed (Psoriasis).     ALPRAZolam (XANAX) 0.25 MG tablet Take 1 tablet (0.25 mg total) by mouth daily. 30 minutes prior to Radiation Treatment 30 tablet 0   No current facility-administered  medications for this visit.      Marland Kitchen  PHYSICAL EXAMINATION: ECOG PERFORMANCE STATUS: 0 - Asymptomatic  Vitals:   06/06/22 1000  BP: 122/79  Pulse: 85  Resp: 18  Temp: 97.6 F (36.4 C)   Filed Weights   06/06/22 1000  Weight: 135 lb 12.8 oz (61.6 kg)    Physical Exam Vitals and nursing note reviewed.  HENT:     Head: Normocephalic and atraumatic.     Mouth/Throat:     Pharynx: Oropharynx is clear.  Eyes:     Extraocular Movements: Extraocular movements intact.     Pupils: Pupils are equal, round, and reactive to light.  Cardiovascular:     Rate and Rhythm: Normal rate and regular rhythm.  Pulmonary:     Comments: Decreased breath sounds bilaterally.  Abdominal:     Palpations: Abdomen is soft.  Musculoskeletal:        General: Normal range of motion.     Cervical back: Normal range of motion.  Skin:    General: Skin is warm.  Neurological:     General: No focal deficit present.     Mental Status: She is alert and oriented to person, place, and time.  Psychiatric:        Behavior: Behavior normal.        Judgment: Judgment normal.     LABORATORY DATA:  I have reviewed the data as listed Lab Results  Component Value Date   WBC 3.1 (L) 05/15/2022   HGB 14.4 05/15/2022   HCT 40.7 05/15/2022   MCV 100 (H) 05/15/2022   PLT 272 05/15/2022   Recent Labs    11/21/21 0959 05/15/22 1002  NA 141 145*  K 4.5 4.6  CL 100 106  CO2 24 23  GLUCOSE 92 102*  BUN 11 11  CREATININE 0.77 0.73  CALCIUM 9.9 9.9  PROT 7.5 6.8  ALBUMIN 4.9* 4.5  AST 45* 65*  ALT 52* 76*  ALKPHOS 90 68  BILITOT 0.5 0.4    RADIOGRAPHIC STUDIES: I have personally reviewed the radiological images as listed and agreed with the findings in the report. DG Bone Density  Result Date: 06/04/2022 EXAM: DUAL X-RAY ABSORPTIOMETRY (DXA) FOR BONE MINERAL DENSITY IMPRESSION: Your patient Tricia Rivera completed a BMD test on 06/04/2022 using the [place] (software version: 14.10)  manufactured by [employer]. The following summarizes the results of  our evaluation. Technologist: SCE PATIENT BIOGRAPHICAL: Name: Tricia Rivera, Tricia Rivera Patient ID: [id number] Birth Date: 15-Sep-1961 Height: 64.0 in. Gender: Female Exam Date: 06/04/2022 Weight: 132.9 lbs. Indications: Caucasian, High Risk Meds, History of Breast Cancer, Hypothyroid, Postmenopausal, Vitamin D Deficiency Fractures: Treatments: Letrozole, Levothyroxine, Vitamin D DENSITOMETRY RESULTS: Site      Region    Measured Date Measured Age WHO Classification Young Adult T-score BMD         %Change vs. Previous Significant Change (*) AP Spine L1-L4 06/04/20[age].3 Osteopenia -1.2 1.045 g/cm2 - - DualFemur Neck Left 06/04/20[age].3 Osteopenia -2.1 0.753 g/cm2 - - ASSESSMENT: The BMD measured at Femur Neck Left is 0.753 g/cm2 with a T-score of -2.1. This patient is considered OSTEOPENIC according to [facility]) criteria. The scan quality is good. World Pharmacologist [facility]) criteria for post-menopausal, Caucasian Women: Normal:                   T-score at or above -1 SD Osteopenia/low bone mass: T-score between -1 and -2.5 SD Osteoporosis:             T-score at or below -2.5 SD RECOMMENDATIONS: 1. All patients should optimize calcium and vitamin D intake. 2. Consider FDA-approved medical therapies in postmenopausal women and men aged 42 years and older, based on the following: a. A hip or vertebral(clinical or morphometric) fracture b. T-score < -2.5 at the femoral neck or spine after appropriate evaluation to exclude secondary causes c. Low bone mass (T-score between -1.0 and -2.5 at the femoral neck or spine) and a 10-year probability of a hip fracture > 3% or a 10-year probability of a major osteoporosis-related fracture > 20% based on the [place]-adapted WHO algorithm 3. Clinician judgment and/or patient preferences may indicate treatment for people with 10-year fracture probabilities above or below these levels  FOLLOW-UP: People with diagnosed cases of osteoporosis or at high risk for fracture should have regular bone mineral density tests. For patients eligible for Medicare, routine testing is allowed once every 2 years. The testing frequency can be increased to one year for patients who have rapidly progressing disease, those who are receiving or discontinuing medical therapy to restore bone mass, or have additional risk factors. I have reviewed this report, and agree with the above findings. Mark A. Thornton Papas, M.D. [facility] Dear Dr Rogue Bussing, Your patient Tricia Rivera completed a FRAX assessment on 06/04/2022 using the [place] (analysis version: 14.10) manufactured by [employer]. The following summarizes the results of our evaluation. PATIENT BIOGRAPHICAL: Name: Tricia Rivera, Tricia Rivera Patient ID: [id number] Birth Date: Jul 25, 1961 Height:    64.0 in. Gender:     Female    [age]       Weight:    132.9 lbs. Ethnicity:  White                            Exam Date: 06/04/2022 FRAX* RESULTS:  (version: 3.5) 10-year Probability of Fracture1 Major Osteoporotic Fracture2 Hip Fracture 10.1% 1.6% Population: [place] (Caucasian) Risk Factors: None Based on Femur (Left) Neck BMD 1 -The 10-year probability of fracture may be lower than reported if the patient has received treatment. 2 -Major Osteoporotic Fracture: Clinical Spine, Forearm, Hip or Shoulder *FRAX is a Materials engineer of the [employer] for Metabolic Bone Disease, a [place] (WHO) [facility]. ASSESSMENT: The probability of  a major osteoporotic fracture is 10.1% within the next ten years. The probability of a hip fracture is 1.6% within the next ten years. I have reviewed this report and agree with the above findings. Mark A. Thornton Papas, M.D. [facility] Electronically Signed   By: Lavonia Dana M.D.   On: 06/04/2022 09:31    ASSESSMENT & PLAN:   Carcinoma of overlapping sites  of left breast in female, estrogen receptor positive ([place]) # MARCH 2023- invasive lobular breast cancer ER;PR Positive; her 2 Negative; stage IA; low risk Oncotype.  No benefit from chemotherapy.  Patient s/p radiation. currently on letrozole [intol to anastrozole; since mid June 2023]  #Tolerating letrozole fairly well.  Continue letrozole at this time.  # Teeth aching: Unlikely any side effect of letrozole.  Monitor for now.  If worse recommend evaluation with dentistry.  #Osteopenia: [June 2023]- The BMD measured at Femur Neck Left  T-score of -2.1.  I discussed at length the potential risk factors for osteoporosis- age/gender/postmenopausal status/use of anti-estrogen treatments. Discussed multiple options including exercise/ calcium and vitamin D supplementation/ and also use of bisphosphonates. Discussed oral bisphosphonates versus parenteral bisphosphonate like Reclast.  Discussed the potential benefits and/side effects  Including but not limited to Osteonecrosis of jaw/ hypocalcemia.  Patient interested in IV infusion rather than taking a pill; ordered reclast for next visit.  [facility]  # DISPOSITION:  # follow up in 3 months MD: labs- cbc/cmp; vit D 25-OH; possible reclast- Dr.B    All questions were answered. The patient/family knows to call the clinic with any problems, questions or concerns.       Tricia Sickle, MD 06/06/2022 1:47 PM

## 2022-06-11 ENCOUNTER — Inpatient Hospital Stay: Payer: BC Managed Care – PPO | Admitting: Occupational Therapy

## 2022-06-11 DIAGNOSIS — M25612 Stiffness of left shoulder, not elsewhere classified: Secondary | ICD-10-CM

## 2022-06-11 NOTE — Therapy (Signed)
[facility] at [facility], [place], [place], [address] Phone: [phone]   Fax:  [phone]  Occupational Therapy Screen  Patient Details  Name: Tricia Rivera MRN: [id number] Date of Birth: 1961-03-12 No data recorded  Encounter Date: 06/11/2022   OT End of Session - 06/11/22 1531     Visit Number 0             Past Medical History:  Diagnosis Date   ADD (attention deficit disorder)    Anxiety    Depression    Family history of breast cancer    Family history of liver cancer    Family history of pancreatic cancer    Hyperlipidemia    Malignant neoplasm of left breast ([place])    Psoriasis    Vitamin D deficiency     Past Surgical History:  Procedure Laterality Date   BREAST BIOPSY Left 01/08/2022   [place] bs, venus marker, invasive mammary   BREAST LUMPECTOMY WITH SENTINEL LYMPH NODE BIOPSY Left 01/24/2022   Procedure: BREAST LUMPECTOMY WITH SENTINEL AXIILLARY LYMPH NODE BX;  Surgeon: Robert Bellow, MD;  Location: [facility] ORS;  Service: General;  Laterality: Left;   BUNIONECTOMY  12/01/2008   BUNIONECTOMY N/A    2015   COLONOSCOPY      There were no vitals filed for this visit.   Subjective Assessment - 06/11/22 1529     Subjective  I am doing really great.  Scar tissue not bothering me.  My active range of motion of the shoulder is great.  I started back walking on the treadmill.  I want to start back with some weights.  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  Radiation was done in May.  Tolerated really well did not had any skin issues.    Currently in Pain? No/denies                 LYMPHEDEMA/ONCOLOGY QUESTIONNAIRE - 06/11/22 0001       Right Upper Extremity Lymphedema   15 cm Proximal to Olecranon Process 28 cm    10 cm Proximal to Olecranon Process 25 cm    Olecranon Process 22.4 cm    Just Proximal to Ulnar Styloid Process 13.4 cm      Left  Upper Extremity Lymphedema   15 cm Proximal to Olecranon Process 26.8 cm    10 cm Proximal to Olecranon Process 24.5 cm    Olecranon Process 22.4 cm    15 cm Proximal to Ulnar Styloid Process 13.5 cm              ASSESSMENT & PLAN: DR Rogue Bussing 06/06/22   Carcinoma of overlapping sites of left breast in female, estrogen receptor positive ([place]) # MARCH 2023- invasive lobular breast cancer ER;PR Positive; her 2 Negative; stage IA; low risk Oncotype.  No benefit from chemotherapy.  Patient s/p radiation. currently on letrozole [intol to anastrozole; since mid June 2023]   #Tolerating letrozole fairly well.  Continue letrozole at this time.   # Teeth aching: Unlikely any side effect of letrozole.  Monitor for now.  If worse recommend evaluation with dentistry.   #Osteopenia: [June 2023]- The BMD measured at Femur Neck Left  T-score of -2.1.  I discussed at length the potential risk factors for osteoporosis- age/gender/postmenopausal status/use of anti-estrogen treatments. Discussed multiple options including exercise/ calcium and vitamin D supplementation/ and also use of bisphosphonates. Discussed oral bisphosphonates versus parenteral  bisphosphonate like Reclast.  Discussed the potential benefits and/side effects  Including but not limited to Osteonecrosis of jaw/ hypocalcemia.  Patient interested in IV infusion rather than taking a pill; ordered reclast for next visit.  [facility]   # DISPOSITION:  # follow up in 3 months MD: labs- cbc/cmp; vit D 25-OH; possible reclast- Dr.B       OT SCREEN 06/11/22: Patient present to OT evaluation postop left lumpectomy on February 20.  2023 Finished radiation 03 Apr 2022 Patient reports she is doing great active range of motion within normal limits with no poor stretch.  Patient with no tenderness over the breast or scar tissue.  Patient works as a Economist.  Is right-hand dominant.  Try and  walk about 3 to 4 miles a day.  Is going to start some weight or strengthening.  But will gradually. Bilateral circumference measurements was taken within normal limits.  Patient was educated and handout provided about lymphedema signs and symptoms as well as prevention. But reinforced with patient that her risk for lymphedema is very low.  No needs at this time patient can contact me if needed in future.                              Visit Diagnosis: Stiffness of left shoulder, not elsewhere classified    Problem List Patient Active Problem List   Diagnosis Date Noted   Osteopenia after menopause 06/06/2022   Rash 05/15/2022   Elevated BP without diagnosis of hypertension 05/15/2022   Genetic testing 02/24/2022   Family history of breast cancer 02/11/2022   Family history of pancreatic cancer 02/11/2022   Family history of liver cancer 02/11/2022   Carcinoma of overlapping sites of left breast in female, estrogen receptor positive ([place]) 02/07/2022   Mass overlapping multiple quadrants of left breast 01/17/2022   Hypothyroidism due to acquired atrophy of thyroid 11/18/2019   ASCUS favoring benign 01/18/2016   ADD (attention deficit disorder) 01/18/2016   Anxiety 01/18/2016   Clinical depression 01/18/2016   Elevated blood sugar 01/18/2016   Hypercholesteremia 01/18/2016   Psoriasis 01/18/2016   Avitaminosis D 01/18/2016    Rosalyn Gess, OTR/L,CLT 06/11/2022, 5:33 PM  [place] at [facility] [address], [place], [place], [address] Phone: [phone]   Fax:  [phone]  Name: Tricia Rivera MRN: [id number] Date of Birth: [birth year]

## 2022-06-25 ENCOUNTER — Other Ambulatory Visit: Payer: Self-pay | Admitting: General Surgery

## 2022-06-26 ENCOUNTER — Other Ambulatory Visit: Payer: Self-pay | Admitting: General Surgery

## 2022-06-26 NOTE — Progress Notes (Signed)
Progress Notes - documented in this encounter Eowyn Tabone, Geronimo Boot, MD - 06/24/2022 10:00 AM EDT Formatting of this note is different from the original. Images from the original note were not included. Subjective:   Patient ID: Tricia Rivera is a 61 y.o. female.  HPI  The following portions of the patient's history were reviewed and updated as appropriate.  This an established patient is here today for: office visit. Here for her follow up visit, post left breast wide excision 01-24-22.  No new breast issues, tolerating the Arimidex.  She also wants to discuss having a colonoscopy, last in 2017. Bowels move daily. She had hemorrhoid banding in May and is doing well, no bleeding.  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 Mainly at [employer] and [employer].  Review of Systems  Constitutional: Negative for chills and fever.  Respiratory: Negative for cough.   Chief Complaint  Patient presents with  Follow-up    BP 122/76  Pulse 81  Temp 36.9 C (98.5 F)  Ht 162.6 cm ('5\' 4"'$ )  Wt 61.7 kg (136 lb)  SpO2 96%  BMI 23.34 kg/m   Past Medical History:  Diagnosis Date  Abnormal EKG  ADD (attention deficit disorder)  Anxiety  Claustrophobia  Depression  Genetic carrier status 02/25/2022  [place] [facility] NExt Expanded + RNA panel: NEGATIVE. [facility]).  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 Closest margin 2 mm. 03 sentinel nodes negative, 2 additional nodes negative.  Psoriasis  Tubular adenoma of colon 01/31/2016  Vitamin D deficiency    Past Surgical History:  Procedure Laterality Date  bunionectomy 12/01/2008  HALLUX VALGUS CORRECTION Right 2015  COLONOSCOPY 01/31/2016  Tubular adenoma of  colon/Repeat 51yr/MGR  ultrasound guided breast biopsy Left 01/08/2022  MASTECTOMY PARTIAL / LUMPECTOMY Left 01/24/2022  with SLN  hemorrhoid banding 04/01/2022    OB History   Gravida  2  Para  2  Term   Preterm   AB   Living     SAB   IAB   Ectopic   Molar   Multiple   Live Births     Obstetric Comments  Age at first period 89Age of first pregnancy 89     Social History   Socioeconomic History  Marital status: Married  Tobacco Use  Smoking status: Never  Passive exposure: Never  Smokeless tobacco: Never  Substance and Sexual Activity  Alcohol use: No  Drug use: No  Sexual activity: Defer    No Known Allergies  Current Outpatient Medications  Medication Sig Dispense Refill  acetaminophen (TYLENOL) 325 MG tablet Take 650 mg by mouth every 4 (four) hours as needed for Pain  anastrozole (ARIMIDEX) 1 mg tablet Take 1 mg by mouth once daily  clobetasol (TEMOVATE) 0.05 % ointment Apply to psoriasis areas twice daily until clear. Can apply under occlusion with saran wrap nightly as needed as well  ergocalciferol, vitamin D2, 1,250 mcg (50,000 unit) capsule Take 50,000 Units by mouth every 7 (seven) days  levothyroxine (SYNTHROID) 50 MCG tablet Take 50 mcg by mouth once daily  triamcinolone 0.1 % cream 1  ZORYVE 0.3 % Crea Apply topically once daily Apply to affected area  ALPRAZolam (XANAX) 0.25 MG tablet TAKE 1 TABLET BY MOUTH DAILY. 30 MINUTES PRIOR TO RADIATION TREATMENT. (Patient  not taking: Reported on 06/24/2022)  hydrocortisone (ANUSOL-HC) 25 mg suppository Place 25 mg rectally as directed for Hemorrhoids (Patient not taking: Reported on 06/24/2022)  lidocaine-prilocaine (EMLA) cream Apply to areola one hour prior to arrival day of procedure. Cover with saran wrap. (Patient not taking: Reported on 06/24/2022) 5 g 0  traMADoL (ULTRAM) 50 mg tablet One tablet every four hours if needed for pain. (Patient not taking: Reported on 02/06/2022) 20  tablet 0   No current facility-administered medications for this visit.   Family History  Problem Relation Age of Onset  Pancreatic cancer Mother  Liver cancer Father  Cancer Maternal Grandfather  Liver cancer Paternal Grandfather  Breast cancer Maternal Aunt  Glaucoma Other  Cancer Other  Colon polyps Neg Hx  Colon cancer Neg Hx  Inflammatory bowel disease Neg Hx   Labs and Radiology:   January 31, 2016 pathology:  4 mm tubular adenoma of the sigmoid colon without high-grade dysplasia. Arther Dames, MD) [place].    Objective:  Physical Exam Exam conducted with a chaperone present.  Constitutional:  Appearance: Normal appearance.  Cardiovascular:  Rate and Rhythm: Normal rate and regular rhythm.  Pulses: Normal pulses.  Heart sounds: Normal heart sounds.  Pulmonary:  Effort: Pulmonary effort is normal.  Breath sounds: Normal breath sounds.  Chest:  Breasts: Right: Normal.  Left: Normal.   Comments: Well-healed left periareolar incision from 6-11 o'clock. Scant edema of the lower inner quadrant skin. Axillary incision well-healed. Full shoulder range of motion. Excellent breast symmetry. Musculoskeletal:  Cervical back: Neck supple.  Lymphadenopathy:  Upper Body:  Right upper body: No supraclavicular or axillary adenopathy.  Left upper body: No supraclavicular or axillary adenopathy.  Skin: General: Skin is warm and dry.  Neurological:  Mental Status: She is alert and oriented to person, place, and time.  Psychiatric:  Mood and Affect: Mood normal.  Behavior: Behavior normal.    Assessment:   Doing well post left breast conservation.  Past history colonic polyp.  Plan:   At the time of her 2017 exam routine was for 5-year follow-up. 2020 [place] multidisciplinary task force that this could be changed to 7+ years with less than 3 polyps under 10 mm in diameter. She is now 6 years out. Offered the opportunity to extend this next year and she decided to go ahead  at this time.  No further rectal bleeding post hemorrhoid banding.  Instructed in regards to preparation by the staff.  We will plan for follow-up bilateral diagnostic mammogram and office visit with Lisabeth Pick, DO in January 2024.   This note is partially prepared by Karie Fetch, RN, acting as a scribe in the presence of Dr. Hervey Ard, MD.  The documentation recorded by the scribe accurately reflects the service I personally performed and the decisions made by me.   Robert Bellow, MD FACS

## 2022-07-02 ENCOUNTER — Other Ambulatory Visit: Payer: Self-pay | Admitting: Physician Assistant

## 2022-07-11 ENCOUNTER — Other Ambulatory Visit: Payer: Self-pay | Admitting: Internal Medicine

## 2022-07-14 ENCOUNTER — Encounter: Payer: Self-pay | Admitting: Internal Medicine

## 2022-07-15 ENCOUNTER — Encounter: Payer: Self-pay | Admitting: General Surgery

## 2022-07-16 ENCOUNTER — Ambulatory Visit
Admission: RE | Admit: 2022-07-16 | Discharge: 2022-07-16 | Disposition: A | Discharge status: Home / Self Care | Payer: BC Managed Care – PPO | Attending: General Surgery | Admitting: General Surgery

## 2022-07-16 ENCOUNTER — Ambulatory Visit: Payer: BC Managed Care – PPO | Admitting: Anesthesiology

## 2022-07-16 ENCOUNTER — Encounter
Admission: RE | Disposition: A | Discharge status: Home / Self Care | Payer: Self-pay | Source: Home / Self Care | Attending: General Surgery

## 2022-07-16 DIAGNOSIS — Z853 Personal history of malignant neoplasm of breast: Secondary | ICD-10-CM | POA: Insufficient documentation

## 2022-07-16 DIAGNOSIS — Z79890 Hormone replacement therapy (postmenopausal): Secondary | ICD-10-CM | POA: Insufficient documentation

## 2022-07-16 DIAGNOSIS — F988 Other specified behavioral and emotional disorders with onset usually occurring in childhood and adolescence: Secondary | ICD-10-CM | POA: Insufficient documentation

## 2022-07-16 DIAGNOSIS — E039 Hypothyroidism, unspecified: Secondary | ICD-10-CM | POA: Diagnosis not present

## 2022-07-16 DIAGNOSIS — F418 Other specified anxiety disorders: Secondary | ICD-10-CM | POA: Diagnosis not present

## 2022-07-16 DIAGNOSIS — K6389 Other specified diseases of intestine: Secondary | ICD-10-CM | POA: Insufficient documentation

## 2022-07-16 DIAGNOSIS — L409 Psoriasis, unspecified: Secondary | ICD-10-CM | POA: Insufficient documentation

## 2022-07-16 DIAGNOSIS — K5730 Diverticulosis of large intestine without perforation or abscess without bleeding: Secondary | ICD-10-CM | POA: Diagnosis not present

## 2022-07-16 DIAGNOSIS — Z79899 Other long term (current) drug therapy: Secondary | ICD-10-CM | POA: Diagnosis not present

## 2022-07-16 DIAGNOSIS — E785 Hyperlipidemia, unspecified: Secondary | ICD-10-CM | POA: Insufficient documentation

## 2022-07-16 DIAGNOSIS — Z1211 Encounter for screening for malignant neoplasm of colon: Secondary | ICD-10-CM | POA: Insufficient documentation

## 2022-07-16 DIAGNOSIS — D123 Benign neoplasm of transverse colon: Secondary | ICD-10-CM | POA: Diagnosis not present

## 2022-07-16 HISTORY — PX: COLONOSCOPY WITH PROPOFOL: SHX5780

## 2022-07-16 SURGERY — COLONOSCOPY WITH PROPOFOL
Anesthesia: General

## 2022-07-16 MED ORDER — PROPOFOL 10 MG/ML IV BOLUS
INTRAVENOUS | Status: AC
  Filled 2022-07-16: qty 40

## 2022-07-16 MED ORDER — SODIUM CHLORIDE 0.9 % IV SOLN
INTRAVENOUS | Status: DC
  Administered 2022-07-16: 20 mL/h via INTRAVENOUS

## 2022-07-16 MED ORDER — LIDOCAINE HCL (CARDIAC) PF 100 MG/5ML IV SOSY
PREFILLED_SYRINGE | INTRAVENOUS | Status: DC | PRN
  Administered 2022-07-16: 50 mg via INTRAVENOUS

## 2022-07-16 MED ORDER — SODIUM CHLORIDE 0.9 % IV SOLN: INTRAVENOUS | Status: DC

## 2022-07-16 MED ORDER — PROPOFOL 500 MG/50ML IV EMUL
INTRAVENOUS | Status: DC | PRN
  Administered 2022-07-16: 150 ug/kg/min via INTRAVENOUS

## 2022-07-16 MED ORDER — PROPOFOL 10 MG/ML IV BOLUS
INTRAVENOUS | Status: DC | PRN
  Administered 2022-07-16: 30 mg via INTRAVENOUS
  Administered 2022-07-16: 20 mg via INTRAVENOUS
  Administered 2022-07-16: 50 mg via INTRAVENOUS

## 2022-07-16 NOTE — Op Note (Addendum)
[facility]astroenterology Patient Name: Tricia Rivera Procedure Date: 07/16/2022 10:06 AM MRN: [id number] Account #: [id number] Date of Birth: 1961-05-12 Admit Type: Outpatient Age: 61 Room: [facility] ENDO ROOM 1 Gender: Female Note Status: Supervisor Override Instrument Name: Peds Colonoscope [id number] Procedure:             Colonoscopy Indications:           Personal history of colonic polyps Providers:             Robert Bellow, MD Medicines:             Propofol per Anesthesia Complications:         No immediate complications. Procedure:             Pre-Anesthesia Assessment:                        - Prior to the procedure, a History and Physical was                         performed, and patient medications, allergies and                         sensitivities were reviewed. The patient's tolerance                         of previous anesthesia was reviewed.                        - The risks and benefits of the procedure and the                         sedation options and risks were discussed with the                         patient. All questions were answered and informed                         consent was obtained.                        After obtaining informed consent, the colonoscope was                         passed under direct vision. Throughout the procedure,                         the patient's blood pressure, pulse, and oxygen                         saturations were monitored continuously. The                         Colonoscope was introduced through the anus and                         advanced to the the cecum, identified by appendiceal                         orifice and ileocecal valve. The colonoscopy was  somewhat difficult due to a tortuous colon. Successful                         completion of the procedure was aided by using manual                         pressure. The patient tolerated the procedure well.                          The quality of the bowel preparation was excellent. Findings:      A 10 mm polyp was found in the transverse colon. The polyp was sessile.       The polyp was removed with a hot snare. Resection and retrieval were       complete.      A few medium-mouthed diverticula were found in the sigmoid colon and       transverse colon.      A diffuse area of mild melanosis was found in the entire colon. This was       biopsied with a cold forceps for histology.      The retroflexed view of the distal rectum and anal verge was normal and       showed no anal or rectal abnormalities. Impression:            - One 10 mm polyp in the transverse colon, removed                         with a hot snare. Resected and retrieved.                        - Diverticulosis in the sigmoid colon and in the                         transverse colon.                        - Melanosis in the colon. Biopsied.                        - The distal rectum and anal verge are normal on                         retroflexion view. Recommendation:        - Telephone endoscopist for pathology results in 1                         week. Procedure Code(s):     --- Professional ---                        [phone], Colonoscopy, flexible; with removal of                         tumor(s), polyp(s), or other lesion(s) by snare                         technique                        83382, 59, Colonoscopy, flexible; with biopsy, single  or multiple Diagnosis Code(s):     --- Professional ---                        Z12.11, Encounter for screening for malignant neoplasm                         of colon                        K63.5, Polyp of colon                        K63.89, Other specified diseases of intestine                        K57.30, Diverticulosis of large intestine without                         perforation or abscess without bleeding CPT copyright 2019 American Medical  Association. All rights reserved. The codes documented in this report are preliminary and upon coder review may  be revised to meet current compliance requirements. Robert Bellow, MD 07/16/2022 10:48:05 AM This report has been signed electronically. Number of Addenda: 0 Note Initiated On: 07/16/2022 10:06 AM Scope Withdrawal Time: 0 hours 11 minutes 3 seconds  Total Procedure Duration: 0 hours 24 minutes 13 seconds  Estimated Blood Loss:  Estimated blood loss: none.      [facility]

## 2022-07-16 NOTE — Transfer of Care (Signed)
Immediate Anesthesia Transfer of Care Note  Patient: Tricia Rivera  Procedure(s) Performed: COLONOSCOPY WITH PROPOFOL  Patient Location: PACU  Anesthesia Type:General  Level of Consciousness: awake and drowsy  Airway & Oxygen Therapy: Patient Spontanous Breathing  Post-op Assessment: Report given to RN and Post -op Vital signs reviewed and stable  Post vital signs: Reviewed and stable  Last Vitals:  Vitals Value Taken Time  BP 110/57 07/16/22 1048  Temp 36.1 C 07/16/22 1048  Pulse 67 07/16/22 1048  Resp 21 07/16/22 1048  SpO2 98 % 07/16/22 1048    Last Pain:  Vitals:   07/16/22 1048  TempSrc: Temporal  PainSc: Asleep         Complications: No notable events documented.

## 2022-07-16 NOTE — H&P (Signed)
Tricia Rivera [id number] 01-12-1961     HPI: Past history tubular adenoma in 2017. For follow up exam. Tolerated prep well.   Medications Prior to Admission  Medication Sig Dispense Refill Last Dose   acetaminophen (TYLENOL) 325 MG tablet Take 650 mg by mouth every 4 (four) hours as needed for mild pain.   07/15/2022   ALPRAZolam (XANAX) 0.25 MG tablet Take 1 tablet (0.25 mg total) by mouth daily. 30 minutes prior to Radiation Treatment 30 tablet 0 Past Week   anastrozole (ARIMIDEX) 1 MG tablet Take 1 mg by mouth daily.   Past Week   clobetasol ointment (TEMOVATE) 9.37 % Apply 1 Application topically 2 (two) times daily.   Past Week   hydrocortisone (ANUSOL-HC) 25 MG suppository Place 25 mg rectally 2 (two) times daily.   Past Week   Ibuprofen 200 MG CAPS Take by mouth.   Past Week   letrozole (FEMARA) 2.5 MG tablet Take 1 tablet (2.5 mg total) by mouth daily. 90 tablet 3 Past Week   levothyroxine (SYNTHROID) 50 MCG tablet TAKE 1 TABLET BY MOUTH EVERY DAY 90 tablet 3 07/15/2022   traMADol (ULTRAM) 50 MG tablet Take 50 mg by mouth as needed for moderate pain.   Past Week   triamcinolone cream (KENALOG) 0.1 % APPLY TWICE A DAY TO AFFECTED AREAS (Patient taking differently: Apply 1 application  topically 2 (two) times daily as needed. APPLY TWICE A DAY TO AFFECTED AREAS) 30 g 6 Past Week   Vitamin D, Ergocalciferol, (DRISDOL) 1.25 MG (50000 UNIT) CAPS capsule TAKE 1 CAPSULE (50,000 UNITS TOTAL) BY MOUTH EVERY 7 (SEVEN) DAYS 12 capsule 1 Past Week   ZORYVE 0.3 % CREA Apply 1 application  topically daily as needed (Psoriasis).   Past Week   No Known Allergies Past Medical History:  Diagnosis Date   ADD (attention deficit disorder)    Anxiety    Depression    Family history of breast cancer    Family history of liver cancer    Family history of pancreatic cancer    Hyperlipidemia    Malignant neoplasm of left breast ([place])    Psoriasis    Vitamin D deficiency    Past Surgical History:   Procedure Laterality Date   BREAST BIOPSY Left 01/08/2022   [place] bs, venus marker, invasive mammary   BREAST LUMPECTOMY WITH SENTINEL LYMPH NODE BIOPSY Left 01/24/2022   Procedure: BREAST LUMPECTOMY WITH SENTINEL AXIILLARY LYMPH NODE BX;  Surgeon: Robert Bellow, MD;  Location: [facility] ORS;  Service: General;  Laterality: Left;   BUNIONECTOMY  12/01/2008   BUNIONECTOMY N/A    2015   COLONOSCOPY     Social History   Socioeconomic History   Marital status: Married    Spouse name: Not on file   Number of children: Not on file   Years of education: Not on file   Highest education level: Not on file  Occupational History   Not on file  Tobacco Use   Smoking status: Never   Smokeless tobacco: Never  Vaping Use   Vaping Use: Never used  Substance and Sexual Activity   Alcohol use: Not Currently    Alcohol/week: 3.0 standard drinks of alcohol    Types: 3 Glasses of wine per week   Drug use: No   Sexual activity: Not Currently  Other Topics Concern   Not on file  Social History Narrative   Lives in [place]; with husband; son' grandson. Daughter- in [place]. Never smoked; no  alcohol. Teacher' currently substitute.    Social Determinants of Health   Financial Resource Strain: Not on file  Food Insecurity: Not on file  Transportation Needs: Not on file  Physical Activity: Not on file  Stress: Not on file  Social Connections: Not on file  Intimate Partner Violence: Not on file   Social History   Social History Narrative   Lives in [place]; with husband; son' grandson. Daughter- in [place]. 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      ROS: Negative.     PE: HEENT: Negative. Lungs: Clear. Cardio: RR.  Assessment/Plan:  Proceed with planned endoscopy.   Forest Gleason [facility] 07/16/2022

## 2022-07-16 NOTE — Anesthesia Preprocedure Evaluation (Addendum)
Anesthesia Evaluation  Patient identified by MRN, date of birth, ID band Patient awake    Reviewed: Allergy & Precautions, NPO status , Patient's Chart, lab work & pertinent test results  History of Anesthesia Complications Negative for: history of anesthetic complications  Airway Mallampati: III  TM Distance: <3 FB Neck ROM: full    Dental  (+) Chipped, Poor Dentition   Pulmonary neg shortness of breath,    Pulmonary exam normal        Cardiovascular Exercise Tolerance: Good (-) angina(-) Past MI and (-) DOE negative cardio ROS Normal cardiovascular exam     Neuro/Psych PSYCHIATRIC DISORDERS negative neurological ROS     GI/Hepatic negative GI ROS, Neg liver ROS,   Endo/Other  Hypothyroidism   Renal/GU      Musculoskeletal   Abdominal   Peds  Hematology negative hematology ROS (+)   Anesthesia Other Findings Past Medical History: No date: ADD (attention deficit disorder) No date: Anxiety No date: Depression No date: Hyperlipidemia No date: Malignant neoplasm of left breast (HCC) No date: Psoriasis No date: Vitamin D deficiency  Past Surgical History: 01/08/2022: BREAST BIOPSY; Left     Comment:  [place] bs, venus marker, path pending 12/01/2008: BUNIONECTOMY No date: BUNIONECTOMY; N/A     Comment:  2015 No date: COLONOSCOPY     Reproductive/Obstetrics negative OB ROS                            Anesthesia Physical  Anesthesia Plan  ASA: 2  Anesthesia Plan: General   Post-op Pain Management:    Induction: Intravenous  PONV Risk Score and Plan: 3 and Treatment may vary due to age or medical condition and Propofol infusion  Airway Management Planned: Natural Airway  Additional Equipment:   Intra-op Plan:   Post-operative Plan: Extubation in OR  Informed Consent: I have reviewed the patients History and Physical, chart, labs and discussed the procedure including the  risks, benefits and alternatives for the proposed anesthesia with the patient or authorized representative who has indicated his/her understanding and acceptance.     Dental Advisory Given  Plan Discussed with: Anesthesiologist, CRNA and Surgeon  Anesthesia Plan Comments: (Patient consented for risks of anesthesia including but not limited to:  - adverse reactions to medications - damage to eyes, teeth, lips or other oral mucosa - nerve damage due to positioning  - sore throat or hoarseness - Damage to heart, brain, nerves, lungs, other parts of body or loss of life  Patient voiced understanding.)       Anesthesia Quick Evaluation

## 2022-07-17 ENCOUNTER — Encounter: Payer: Self-pay | Admitting: General Surgery

## 2022-07-17 LAB — SURGICAL PATHOLOGY

## 2022-07-17 NOTE — Anesthesia Postprocedure Evaluation (Signed)
Anesthesia Post Note  Patient: Tricia Rivera  Procedure(s) Performed: COLONOSCOPY WITH PROPOFOL  Patient location during evaluation: PACU Anesthesia Type: General Level of consciousness: awake and alert Pain management: pain level controlled Vital Signs Assessment: post-procedure vital signs reviewed and stable Respiratory status: spontaneous breathing, nonlabored ventilation and respiratory function stable Cardiovascular status: blood pressure returned to baseline and stable Postop Assessment: no apparent nausea or vomiting Anesthetic complications: no   No notable events documented.   Last Vitals:  Vitals:   07/16/22 1048 07/16/22 1058  BP: (!) 110/57 (!) 144/76  Pulse: 67 70  Resp: (!) 21 (!) 23  Temp: (!) 36.1 C   SpO2: 98% 99%    Last Pain:  Vitals:   07/17/22 0726  TempSrc:   PainSc: 0-No pain                 [place] Ouch

## 2022-08-16 ENCOUNTER — Other Ambulatory Visit: Payer: Self-pay | Admitting: Physician Assistant

## 2022-08-16 DIAGNOSIS — E559 Vitamin D deficiency, unspecified: Secondary | ICD-10-CM

## 2022-09-05 ENCOUNTER — Inpatient Hospital Stay: Payer: BC Managed Care – PPO | Attending: Internal Medicine | Admitting: Internal Medicine

## 2022-09-05 ENCOUNTER — Encounter: Payer: Self-pay | Admitting: Internal Medicine

## 2022-09-05 ENCOUNTER — Inpatient Hospital Stay: Payer: BC Managed Care – PPO

## 2022-09-05 DIAGNOSIS — C50812 Malignant neoplasm of overlapping sites of left female breast: Secondary | ICD-10-CM | POA: Insufficient documentation

## 2022-09-05 DIAGNOSIS — Z780 Asymptomatic menopausal state: Secondary | ICD-10-CM

## 2022-09-05 DIAGNOSIS — Z170 Estrogen receptor positive status [ER+]: Secondary | ICD-10-CM | POA: Insufficient documentation

## 2022-09-05 DIAGNOSIS — Z79811 Long term (current) use of aromatase inhibitors: Secondary | ICD-10-CM | POA: Insufficient documentation

## 2022-09-05 DIAGNOSIS — Z79899 Other long term (current) drug therapy: Secondary | ICD-10-CM | POA: Insufficient documentation

## 2022-09-05 DIAGNOSIS — Z923 Personal history of irradiation: Secondary | ICD-10-CM | POA: Insufficient documentation

## 2022-09-05 DIAGNOSIS — M85852 Other specified disorders of bone density and structure, left thigh: Secondary | ICD-10-CM | POA: Diagnosis not present

## 2022-09-05 LAB — CBC WITH DIFFERENTIAL/PLATELET
Abs Immature Granulocytes: 0.02 10*3/uL (ref 0.00–0.07)
Basophils Absolute: 0 10*3/uL (ref 0.0–0.1)
Basophils Relative: 1 %
Eosinophils Absolute: 0.1 10*3/uL (ref 0.0–0.5)
Eosinophils Relative: 4 %
HCT: 41.2 % (ref 36.0–46.0)
Hemoglobin: 14.2 g/dL (ref 12.0–15.0)
Immature Granulocytes: 1 %
Lymphocytes Relative: 29 %
Lymphs Abs: 1.1 10*3/uL (ref 0.7–4.0)
MCH: 33.4 pg (ref 26.0–34.0)
MCHC: 34.5 g/dL (ref 30.0–36.0)
MCV: 96.9 fL (ref 80.0–100.0)
Monocytes Absolute: 0.5 10*3/uL (ref 0.1–1.0)
Monocytes Relative: 14 %
Neutro Abs: 2 10*3/uL (ref 1.7–7.7)
Neutrophils Relative %: 51 %
Platelets: 310 10*3/uL (ref 150–400)
RBC: 4.25 MIL/uL (ref 3.87–5.11)
RDW: 12.4 % (ref 11.5–15.5)
WBC: 3.8 10*3/uL — ABNORMAL LOW (ref 4.0–10.5)
nRBC: 0 % (ref 0.0–0.2)

## 2022-09-05 LAB — COMPREHENSIVE METABOLIC PANEL
ALT: 43 U/L (ref 0–44)
AST: 48 U/L — ABNORMAL HIGH (ref 15–41)
Albumin: 4.2 g/dL (ref 3.5–5.0)
Alkaline Phosphatase: 69 U/L (ref 38–126)
Anion gap: 9 (ref 5–15)
BUN: 16 mg/dL (ref 8–23)
CO2: 26 mmol/L (ref 22–32)
Calcium: 9.6 mg/dL (ref 8.9–10.3)
Chloride: 104 mmol/L (ref 98–111)
Creatinine, Ser: 0.75 mg/dL (ref 0.44–1.00)
GFR, Estimated: >60 mL/min (ref >60)
Glucose, Bld: 116 mg/dL — ABNORMAL HIGH (ref 70–99)
Potassium: 3.9 mmol/L (ref 3.5–5.1)
Sodium: 139 mmol/L (ref 135–145)
Total Bilirubin: 0.5 mg/dL (ref 0.3–1.2)
Total Protein: 7.7 g/dL (ref 6.5–8.1)

## 2022-09-05 LAB — VITAMIN D 25 HYDROXY (VIT D DEFICIENCY, FRACTURES): Vit D, 25-Hydroxy: 95.39 ng/mL (ref 30–100)

## 2022-09-05 MED ORDER — SODIUM CHLORIDE 0.9 % IV SOLN
Freq: Once | INTRAVENOUS | Status: AC
  Filled 2022-09-05: qty 250

## 2022-09-05 MED ORDER — ZOLEDRONIC ACID 5 MG/100ML IV SOLN
5.0000 mg | Freq: Once | INTRAVENOUS | Status: AC
  Administered 2022-09-05: 5 mg via INTRAVENOUS
  Filled 2022-09-05: qty 100

## 2022-09-05 NOTE — Progress Notes (Signed)
Patient is doing well on the Letrozole.

## 2022-09-05 NOTE — Progress Notes (Signed)
one Tricia Rivera NOTE  Patient Care Team: [name], PA-C as PCP - General (Physician Assistant) Theodore Demark, RN (Inactive) as Oncology Nurse Navigator Tricia Sickle, MD as Consulting Physician (Oncology) Tricia Rivera Forest Gleason, MD as Consulting Physician (General Surgery) Noreene Filbert, MD as Consulting Physician (Radiation Oncology)  CHIEF COMPLAINTS/PURPOSE OF CONSULTATION: Breast cancer    Oncology History Overview Note  #Invasive lobular breast cancere ER;PR Positive; her 2 Negative; stage IA. mT [22m]; pN0; s/p lumpectomy; Oncotype -;   # IMPRESSION: 1. A 6 mm mass in the LEFT inner breast at 9 o'clock 1 cm from the nipple with associated architectural distortion is indeterminate. Recommend ultrasound-guided biopsy for definitive characterization with attention on post marker placement mammogram to assess for mammographic/sonographic correlation. 2. No suspicious LEFT axillary adenopathy.  DIAGNOSIS:  A.  BREAST, LEFT 9:00 1 CM FN; ULTRASOUND-GUIDED BIOPSY:  - INVASIVE MAMMARY CARCINOMA WITH LOBULAR FEATURES.  - LOBULAR NEOPLASIA.   Size of invasive carcinoma: 8 mm in this sample  Histologic grade of invasive carcinoma: Grade 2                       Glandular/tubular differentiation score: 3                       Nuclear pleomorphism score: 2                       Mitotic rate score: 1                       Total score: 6  Ductal carcinoma in situ: Not identified  Lymphovascular invasion: Not identified   ER/PR/HER2: Immunohistochemistry will be performed on block A1, with  reflex to [place] HER2 2+. The results will be reported in an addendum.   Comment:  The definitive grade will be assigned on the excisional specimen.   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  Oncotype low risk. MAY 12th, 2023- Anastrazole.[Stopped sec to MSK]  # MID JUNE 2023- LETROZOLE    Carcinoma of overlapping sites of left breast in female, estrogen receptor positive ([place]  02/07/2022 Initial Diagnosis   Carcinoma of overlapping sites of left breast in female, estrogen receptor positive ([place]   02/07/2022 Cancer Staging   Staging form: Breast, AJCC 8th Edition - Pathologic: Stage IA (pT1c, pN0, cM0, G2, ER+, PR+, HER2-) - Signed by Tricia Sickle MD on 02/07/2022 Histologic grading system: 3 grade system    Genetic Testing   Negative genetic testing. No pathogenic variants identified on the [facility]Exp[facility]RNA panel. The report date is 02/21/2022.  The CancerNext-Expanded + RNAinsight gene panel offered by [employer] includes sequencing and rearrangement analysis for the following 77 genes: IP, ALK, APC*, ATM*, AXIN2, BAP1, BARD1, BLM, BMPR1A, BRCA1*, BRCA2*, BRIP1*, CDC73, CDH1*,CDK4, CDKN1B, CDKN2A, CHEK2*, CTNNA1, DICER1, FANCC, FH, FLCN, GALNT12, KIF1B, LZTR1, MAX, MEN1, MET, MLH1*, MSH2*, MSH3, MSH6*, MUTYH*, NBN, NF1*, NF2, NTHL1, PALB2*, PHOX2B, PMS2*, POT1, PRKAR1A, PTCH1, PTEN*, RAD51C*, RAD51D*,RB1, RECQL, RET, SDHA, SDHAF2, SDHB, SDHC, SDHD, SMAD4, SMARCA4, SMARCB1, SMARCE1, STK11, SUFU, TMEM127, TP53*,TSC1, TSC2, VHL and XRCC2 (sequencing and deletion/duplication); EGFR, EGLN1, HOXB13, KIT, MITF, PDGFRA, POLD1 and POLE (sequencing only); EPCAM and GREM1 (deletion/duplication only).     HISTORY OF PRESENTING ILLNESS: Patient ambulating independently.  Alone.  Tricia HolbeinMoyers 61y.o.  female with a history of  stage I ER/PR breast cancer currently on adjuvant AI is here for follow-up/proceed with reclast.  Patient is doing well on the Letrozole.  Patient denies any dental pain.  Otherwise no worsening joint pains or back pain.   Review of Systems  Constitutional:  Negative for chills, diaphoresis, fever, malaise/fatigue and weight loss.  HENT:  Negative for nosebleeds and sore throat.   Eyes:  Negative for double vision.  Respiratory:   Negative for cough, hemoptysis, sputum production, shortness of breath and wheezing.   Cardiovascular:  Negative for chest pain, palpitations, orthopnea and leg swelling.  Gastrointestinal:  Negative for abdominal pain, blood in stool, constipation, diarrhea, heartburn, melena, nausea and vomiting.  Genitourinary:  Negative for dysuria, frequency and urgency.  Musculoskeletal:  Positive for back pain and joint pain.  Skin: Negative.  Negative for itching and rash.  Neurological:  Negative for dizziness, tingling, focal weakness, weakness and headaches.  Endo/Heme/Allergies:  Does not bruise/bleed easily.  Psychiatric/Behavioral:  Negative for depression. The patient is not nervous/anxious and does not have insomnia.      MEDICAL HISTORY:  Past Medical History:  Diagnosis Date   ADD (attention deficit disorder)    Anxiety    Depression    Family history of breast cancer    Family history of liver cancer    Family history of pancreatic cancer    Hyperlipidemia    Malignant neoplasm of left breast ([place])    Psoriasis    Vitamin D deficiency     SURGICAL HISTORY: Past Surgical History:  Procedure Laterality Date   BREAST BIOPSY Left 01/08/2022   [place] bs, venus marker, invasive mammary   BREAST LUMPECTOMY WITH SENTINEL LYMPH NODE BIOPSY Left 01/24/2022   Procedure: BREAST LUMPECTOMY WITH SENTINEL AXIILLARY LYMPH NODE BX;  Surgeon: Robert Bellow, MD;  Location: [facility] ORS;  Service: General;  Laterality: Left;   BUNIONECTOMY  12/01/2008   BUNIONECTOMY N/A    2015   COLONOSCOPY     COLONOSCOPY WITH PROPOFOL N/A 07/16/2022   Procedure: COLONOSCOPY WITH PROPOFOL;  Surgeon: Robert Bellow, MD;  Location: [facility];  Service: Endoscopy;  Laterality: N/A;    SOCIAL HISTORY: Social History   Socioeconomic History   Marital status: Married    Spouse name: Not on file   Number of children: Not on file   Years of education: Not on file   Highest education level: Not on file   Occupational History   Not on file  Tobacco Use   Smoking status: Never   Smokeless tobacco: Never  Vaping Use   Vaping Use: Never used  Substance and Sexual Activity   Alcohol use: Not Currently    Alcohol/week: 3.0 standard drinks of alcohol    Types: 3 Glasses of wine per week   Drug use: No   Sexual activity: Not Currently  Other Topics Concern   Not on file  Social History Narrative   Lives in [place]; with husband; son' grandson. Daughter- in [place]. 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    Social Determinants of Health   Financial Resource Strain: Not on file  Food Insecurity: Not on file  Transportation Needs: Not on file  Physical Activity: Not on file  Stress: Not on file  Social Connections: Not on file  Intimate Partner Violence: Not on file    FAMILY HISTORY: Family History  Problem Relation Age of Onset   Pancreatic cancer Mother    Liver cancer Father    Breast cancer Maternal  Aunt    Liver cancer Maternal Grandfather     ALLERGIES:  has No Known Allergies.  MEDICATIONS:  Current Outpatient Medications  Medication Sig Dispense Refill   acetaminophen (TYLENOL) 325 MG tablet Take 650 mg by mouth every 4 (four) hours as needed for mild pain.     clobetasol ointment (TEMOVATE) 5.00 % Apply 1 Application topically 2 (two) times daily.     hydrocortisone (ANUSOL-HC) 25 MG suppository Place 25 mg rectally 2 (two) times daily.     Ibuprofen 200 MG CAPS Take by mouth.     letrozole (FEMARA) 2.5 MG tablet Take 1 tablet (2.5 mg total) by mouth daily. 90 tablet 3   levothyroxine (SYNTHROID) 50 MCG tablet TAKE 1 TABLET BY MOUTH EVERY DAY 90 tablet 3   traMADol (ULTRAM) 50 MG tablet Take 50 mg by mouth as needed for moderate pain.     triamcinolone cream (KENALOG) 0.1 % APPLY TWICE A DAY TO AFFECTED AREAS (Patient taking differently: Apply 1 application  topically 2 (two) times daily as needed. APPLY TWICE A DAY TO AFFECTED AREAS) 30 g 6    Vitamin D, Ergocalciferol, (DRISDOL) 1.25 MG (50000 UNIT) CAPS capsule TAKE 1 CAPSULE (50,000 UNITS TOTAL) BY MOUTH EVERY 7 (SEVEN) DAYS 12 capsule 1   ZORYVE 0.3 % CREA Apply 1 application  topically daily as needed (Psoriasis).     ALPRAZolam (XANAX) 0.25 MG tablet Take 1 tablet (0.25 mg total) by mouth daily. 30 minutes prior to Radiation Treatment 30 tablet 0   anastrozole (ARIMIDEX) 1 MG tablet Take 1 mg by mouth daily. (Patient not taking: Reported on 09/05/2022)     No current facility-administered medications for this visit.      Marland Kitchen  PHYSICAL EXAMINATION: ECOG PERFORMANCE STATUS: 0 - Asymptomatic  Vitals:   09/05/22 0900  BP: 132/82  Pulse: 70  Resp: 18  Temp: 97.9 F (36.6 C)   Filed Weights   09/05/22 0900  Weight: 135 lb 6.4 oz (61.4 kg)    Physical Exam Vitals and nursing note reviewed.  HENT:     Head: Normocephalic and atraumatic.     Mouth/Throat:     Pharynx: Oropharynx is clear.  Eyes:     Extraocular Movements: Extraocular movements intact.     Pupils: Pupils are equal, round, and reactive to light.  Cardiovascular:     Rate and Rhythm: Normal rate and regular rhythm.  Pulmonary:     Comments: Decreased breath sounds bilaterally.  Abdominal:     Palpations: Abdomen is soft.  Musculoskeletal:        General: Normal range of motion.     Cervical back: Normal range of motion.  Skin:    General: Skin is warm.  Neurological:     General: No focal deficit present.     Mental Status: She is alert and oriented to person, place, and time.  Psychiatric:        Behavior: Behavior normal.        Judgment: Judgment normal.     LABORATORY DATA:  I have reviewed the data as listed Lab Results  Component Value Date   WBC 3.8 (L) 09/05/2022   HGB 14.2 09/05/2022   HCT 41.2 09/05/2022   MCV 96.9 09/05/2022   PLT 310 09/05/2022   Recent Labs    11/21/21 0959 05/15/22 1002 09/05/22 0906  NA 141 145* 139  K 4.5 4.6 3.9  CL 100 106 104  CO2 24 23 26    GLUCOSE 92 102*  116*  BUN 11 11 16   CREATININE 0.77 0.73 0.75  CALCIUM 9.9 9.9 9.6  GFRNONAA  --   --  >60  PROT 7.5 6.8 7.7  ALBUMIN 4.9* 4.5 4.2  AST 45* 65* 48*  ALT 52* 76* 43  ALKPHOS 90 68 69  BILITOT 0.5 0.4 0.5    RADIOGRAPHIC STUDIES: I have personally reviewed the radiological images as listed and agreed with the findings in the report. No results found.  ASSESSMENT & PLAN:   Carcinoma of overlapping sites of left breast in female, estrogen receptor positive ([place]) # MARCH 2023- invasive lobular breast cancer ER;PR Positive; her 2 Negative; stage IA; low risk Oncotype.  No benefit from chemotherapy.  Patient s/p radiation. currently on letrozole [intol to anastrozole; since mid June 2023] [Dr.Byrnett]. Tolerating letrozole fairly well.  Continue letrozole at this time.  #Osteopenia: [June 2023]- The BMD measured at Femur Neck Left  T-score of -2.1.  I discussed at length the potential risk factors for osteoporosis- age/gender/postmenopausal status/use of anti-estrogen treatments.proceed with Reclast.  Discussed the potential benefits and/side effects  Including but not limited to Osteonecrosis of jaw/ hypocalcemia. Also [facility]  # Slightly elevated- AST- ?  Related to fatty liver versus others.  Recommend decreasing Tylenol/ibuprofen [2-4 a day for many years]. monitor for now.   # DISPOSITION:  # reclast today # follow up in 48month MD: labs- cbc/cmp- Dr.B    All questions were answered. The patient/family knows to call the clinic with any problems, questions or concerns.       GCammie Sickle MD 09/05/2022 9:56 AM

## 2022-09-05 NOTE — Assessment & Plan Note (Addendum)
#   MARCH 2023- invasive lobular breast cancer ER;PR Positive; her 2 Negative; stage IA; low risk Oncotype.  No benefit from chemotherapy.  Patient s/p radiation. currently on letrozole [intol to anastrozole; since mid June 2023] [Dr.Byrnett]. Tolerating letrozole fairly well.  Continue letrozole at this time.  #Osteopenia: [June 2023]- The BMD measured at Femur Neck Left  T-score of -2.1.  I discussed at length the potential risk factors for osteoporosis- age/gender/postmenopausal status/use of anti-estrogen treatments.proceed with Reclast.  Discussed the potential benefits and/side effects  Including but not limited to Osteonecrosis of jaw/ hypocalcemia. Also encouraged [facility].  # Slightly elevated- AST- ?  Related to fatty liver versus others.  Recommend decreasing Tylenol/ibuprofen [2-4 a day for many years]. monitor for now.   # DISPOSITION:  # reclast today # follow up in 74month MD: labs- cbc/cmp- Dr.B

## 2022-09-05 NOTE — Patient Instructions (Addendum)
#  Recommend taking calcium 1000 mg a day.

## 2022-10-03 ENCOUNTER — Ambulatory Visit: Payer: Self-pay | Admitting: *Deleted

## 2022-10-03 ENCOUNTER — Ambulatory Visit: Payer: BC Managed Care – PPO | Admitting: Physician Assistant

## 2022-10-03 NOTE — Telephone Encounter (Signed)
  Chief Complaint: sore throat, dry cough, nasal congestion Symptoms: above Frequency: Started last night. Pertinent Negatives: Patient denies fever or coughing up anything Disposition: '[]'$ ED /'[]'$ Urgent Care (no appt availability in office) / '[x]'$ Appointment(In office/virtual)/ '[]'$  [facility]/ '[]'$ Home Care/ '[]'$ Refused Recommended Disposition /'[]'$  Mobile Bus/ '[]'$  Follow-up with PCP Additional Notes: Appt. Made for today at 2:40 with [name], PA-C.

## 2022-10-03 NOTE — Telephone Encounter (Signed)
Reason for Disposition  [1] Sore throat with cough/cold symptoms AND [2] present > 5 days  Answer Assessment - Initial Assessment Questions 1. ONSET: "When did the throat start hurting?" (Hours or days ago)      Last night started.   I have a headache and a sore throat and stopped up.  No fever  No shortness of breath 2. SEVERITY: "How bad is the sore throat?" (Scale 1-10; mild, moderate or severe)   - MILD (1-3):  Doesn't interfere with eating or normal activities.   - MODERATE (4-7): Interferes with eating some solids and normal activities.   - SEVERE (8-10):  Excruciating pain, interferes with most normal activities.   - SEVERE WITH DYSPHAGIA (10): Can't swallow liquids, drooling.     Coughing dry cough 3. STREP EXPOSURE: "Has there been any exposure to strep within the past week?" If Yes, ask: "What type of contact occurred?"      No 4.  VIRAL SYMPTOMS: "Are there any symptoms of a cold, such as a runny nose, cough, hoarse voice or red eyes?"      Runny nose, sore throat, cough, sore throat 5. FEVER: "Do you have a fever?" If Yes, ask: "What is your temperature, how was it measured, and when did it start?"     No 6. PUS ON THE TONSILS: "Is there pus on the tonsils in the back of your throat?"     No 7. OTHER SYMPTOMS: "Do you have any other symptoms?" (e.g., difficulty breathing, headache, rash)     Headache, coughing  see above 8. PREGNANCY: "Is there any chance you are pregnant?" "When was your last menstrual period?"     N/A due to age  Protocols used: Sore Throat-A-AH

## 2022-10-06 ENCOUNTER — Ambulatory Visit: Payer: BC Managed Care – PPO | Admitting: Radiation Oncology

## 2022-10-08 ENCOUNTER — Ambulatory Visit
Admission: RE | Admit: 2022-10-08 | Discharge: 2022-10-08 | Disposition: A | Discharge status: Home / Self Care | Payer: BC Managed Care – PPO | Source: Ambulatory Visit | Attending: Radiation Oncology | Admitting: Radiation Oncology

## 2022-10-08 ENCOUNTER — Encounter: Payer: Self-pay | Admitting: Radiation Oncology

## 2022-10-08 VITALS — BP 160/92 | HR 90 | Ht 64.0 in | Wt 135.1 lb

## 2022-10-08 DIAGNOSIS — Z79811 Long term (current) use of aromatase inhibitors: Secondary | ICD-10-CM | POA: Diagnosis not present

## 2022-10-08 DIAGNOSIS — Z923 Personal history of irradiation: Secondary | ICD-10-CM | POA: Diagnosis not present

## 2022-10-08 DIAGNOSIS — Z170 Estrogen receptor positive status [ER+]: Secondary | ICD-10-CM | POA: Insufficient documentation

## 2022-10-08 DIAGNOSIS — C50812 Malignant neoplasm of overlapping sites of left female breast: Secondary | ICD-10-CM | POA: Diagnosis present

## 2022-10-08 NOTE — Progress Notes (Signed)
Radiation Oncology Follow up Note  Name: Tricia Rivera   Date:   10/08/2022 MRN:  [id number] DOB: Jul 23, 1961    This 61 y.o. female presents to the clinic today for 67-monthfollow-up status post whole breast radiation to her left breast for stage Ia ER/PR positive invasive lobular carcinoma.  REFERRING PROVIDER: DMikey Kirschner PA-C  HPI: Patient is a 61 year old female now at 6 months having completed whole breast radiation to her left breast for stage Ia ER/PR positive invasive lobular carcinoma.  Seen today in routine follow-up she is doing well.  She specifically denies breast tenderness cough or bone pain..  She is currently on Arimidex tolerating it well without side effect.  She has not yet had a follow-up mammogram.  COMPLICATIONS OF TREATMENT: none  FOLLOW UP COMPLIANCE: keeps appointments   PHYSICAL EXAM:  BP (!) 160/92   Pulse 90   Ht '5\' 4"'$  (1.626 m)   Wt 135 lb 1.6 oz (61.3 kg)   BMI 23.19 kg/m  Lungs are clear to A&P cardiac examination essentially unremarkable with regular rate and rhythm. No dominant mass or nodularity is noted in either breast in 2 positions examined. Incision is well-healed. No axillary or supraclavicular adenopathy is appreciated. Cosmetic result is excellent.  Well-developed well-nourished patient in NAD. HEENT reveals PERLA, EOMI, discs not visualized.  Oral cavity is clear. No oral mucosal lesions are identified. Neck is clear without evidence of cervical or supraclavicular adenopathy. Lungs are clear to A&P. Cardiac examination is essentially unremarkable with regular rate and rhythm without murmur rub or thrill. Abdomen is benign with no organomegaly or masses noted. Motor sensory and DTR levels are equal and symmetric in the upper and lower extremities. Cranial nerves II through XII are grossly intact. Proprioception is intact. No peripheral adenopathy or edema is identified. No motor or sensory levels are noted. Crude visual fields are within  normal range.  RADIOLOGY RESULTS: No current films for review  PLAN: Present time patient is doing well 6 months out from whole breast radiation and pleased with her overall progress.  Of asked to see her back in 6 months for follow-up.  Anticipate her having a mammogram by that time.  Patient is to call with any concerns.  She continues on Arimidex without side effect.  I would like to take this opportunity to thank you for allowing me to participate in the care of your patient..Noreene Filbert MD

## 2022-10-20 ENCOUNTER — Ambulatory Visit (INDEPENDENT_AMBULATORY_CARE_PROVIDER_SITE_OTHER): Payer: BC Managed Care – PPO | Admitting: Physician Assistant

## 2022-10-20 ENCOUNTER — Encounter: Payer: Self-pay | Admitting: Physician Assistant

## 2022-10-20 VITALS — BP 152/97 | HR 80 | Wt 135.8 lb

## 2022-10-20 DIAGNOSIS — M25641 Stiffness of right hand, not elsewhere classified: Secondary | ICD-10-CM | POA: Diagnosis not present

## 2022-10-20 DIAGNOSIS — R052 Subacute cough: Secondary | ICD-10-CM | POA: Insufficient documentation

## 2022-10-20 DIAGNOSIS — F4010 Social phobia, unspecified: Secondary | ICD-10-CM | POA: Insufficient documentation

## 2022-10-20 MED ORDER — BUSPIRONE HCL 5 MG PO TABS: 5.0000 mg | ORAL_TABLET | Freq: Two times a day (BID) | ORAL | 1 refills | Status: DC

## 2022-10-20 MED ORDER — ALPRAZOLAM 0.25 MG PO TABS: 0.2500 mg | ORAL_TABLET | Freq: Every day | ORAL | 0 refills | Status: DC | PRN

## 2022-10-20 MED ORDER — PROPRANOLOL HCL 10 MG PO TABS: ORAL_TABLET | ORAL | 1 refills | Status: DC

## 2022-10-20 NOTE — Assessment & Plan Note (Signed)
Lungs CTA Advised adding antihistamine-- zyretec or claritin

## 2022-10-20 NOTE — Assessment & Plan Note (Signed)
Discussed buspar vs prozac, different MOA. As buspar may be effective sooner, pt prefers. Advised starting back on buspar 5 mg BID can increase gradually to 10 mg BID if needed. Discussed trying propranolol 10 mg PRN social situations. Rx #10 xanax 0.25 'in case of emergency' for anxiety. Ideally would not refill.  F/u 6-8 weeks

## 2022-10-20 NOTE — Assessment & Plan Note (Signed)
No abnormalities on exam Advised could be a tendon issue although arthritis is possible. Advised she monitor, can use warm compress.

## 2022-10-20 NOTE — Progress Notes (Signed)
I,Sha'taria Tyson,acting as a Education administrator for [employer], PA-C.,have documented all relevant documentation on the behalf of Tricia Kirschner, PA-C,as directed by  Tricia Kirschner, PA-C while in the presence of Tricia Kirschner, PA-C.   Established patient visit   Patient: Tricia Rivera   DOB: 1961/12/01   61 y.o. Female  MRN: [id number] Visit Date: 10/20/2022  Today's healthcare provider: [name], PA-C   Cc. Anxiety, cough, hand stiffness  Subjective    HPI  Pt has several concerns today.  Residual cough -Reports being sick enough to be in bed twice over the last 4-6 weeks. Reports a cough is lingering. Denies any SOB, chest pain, fevers.   Right hand middle finger ROM issues -Pt reports she often wakes up and her middle finger feels stuck, she cannot extend it. Reports when she is able to she will hear a pop. Denies pain, swelling. Concerned she has arthritis.   Anxiety. -Pt attests to significant social anxiety. She avoids social situations, nervous about her daughter's upcoming wedding. She bites her nails d/t anxiety. She reports being on prozac and buspar previously which helped. Denies depression symptoms.     10/20/2022   11:01 AM 11/21/2021    9:31 AM 11/19/2020    8:59 AM 06/09/2018    9:13 AM 06/08/2017    9:58 AM  Depression screen PHQ 2/9  Decreased Interest 1 0 0 0 0  Down, Depressed, Hopeless 0 0 0 0 0  PHQ - 2 Score 1 0 0 0 0  Altered sleeping 1 0  0 0  Tired, decreased energy 0 0  0 0  Change in appetite 1 0  0 0  Feeling bad or failure about yourself  0 0  0 0  Trouble concentrating 0 0  1 0  Moving slowly or fidgety/restless 3 0  0 0  Suicidal thoughts 0 0  0 0  PHQ-9 Score 6 0  1 0  Difficult doing work/chores Somewhat difficult Not difficult at all  Not difficult at all Not difficult at all       10/20/2022   10:59 AM  GAD 7 : Generalized Anxiety Score  Worry too much - different things 0  Restless 0  Anxiety Difficulty Very difficult     Medications: Outpatient Medications Prior to Visit  Medication Sig   acetaminophen (TYLENOL) 325 MG tablet Take 650 mg by mouth every 4 (four) hours as needed for mild pain.   clobetasol ointment (TEMOVATE) 4.31 % Apply 1 Application topically 2 (two) times daily.   hydrocortisone (ANUSOL-HC) 25 MG suppository Place 25 mg rectally 2 (two) times daily.   Ibuprofen 200 MG CAPS Take by mouth.   letrozole (FEMARA) 2.5 MG tablet Take 1 tablet (2.5 mg total) by mouth daily.   levothyroxine (SYNTHROID) 50 MCG tablet TAKE 1 TABLET BY MOUTH EVERY DAY   traMADol (ULTRAM) 50 MG tablet Take 50 mg by mouth as needed for moderate pain.   triamcinolone cream (KENALOG) 0.1 % APPLY TWICE A DAY TO AFFECTED AREAS (Patient taking differently: Apply 1 application  topically 2 (two) times daily as needed. APPLY TWICE A DAY TO AFFECTED AREAS)   Vitamin D, Ergocalciferol, (DRISDOL) 1.25 MG (50000 UNIT) CAPS capsule TAKE 1 CAPSULE (50,000 UNITS TOTAL) BY MOUTH EVERY 7 (SEVEN) DAYS   ZORYVE 0.3 % CREA Apply 1 application  topically daily as needed (Psoriasis).   [DISCONTINUED] ALPRAZolam (XANAX) 0.25 MG tablet Take 1 tablet (0.25 mg total) by mouth daily. [place]  minutes prior to Radiation Treatment   anastrozole (ARIMIDEX) 1 MG tablet Take 1 mg by mouth daily. (Patient not taking: Reported on 09/05/2022)   No facility-administered medications prior to visit.    Review of Systems  Constitutional:  Negative for fatigue and fever.  Respiratory:  Positive for cough. Negative for shortness of breath.   Cardiovascular:  Negative for chest pain and leg swelling.  Gastrointestinal:  Negative for abdominal pain.  Musculoskeletal:        Hand stiffness  Neurological:  Negative for dizziness and headaches.  Psychiatric/Behavioral:  The patient is nervous/anxious.        Objective    Blood pressure (!) 152/97, pulse 80, weight 135 lb 12.8 oz (61.6 kg), SpO2 100 %.   Physical Exam Constitutional:      General: She is  awake.     Appearance: She is well-developed.  HENT:     Head: Normocephalic.  Eyes:     Conjunctiva/sclera: Conjunctivae normal.  Cardiovascular:     Rate and Rhythm: Normal rate.  Pulmonary:     Effort: Pulmonary effort is normal.     Breath sounds: Normal breath sounds.  Musculoskeletal:     Comments: Full ROM middle finger R hand No palpable nodules or thickening  Skin:    General: Skin is warm.  Neurological:     Mental Status: She is alert and oriented to person, place, and time.  Psychiatric:        Attention and Perception: Attention normal.        Mood and Affect: Mood normal.        Speech: Speech normal.        Behavior: Behavior is cooperative.     No results found for any visits on 10/20/22.  Assessment & Plan     Problem List Items Addressed This Visit       Other   Social anxiety disorder - Primary    Discussed buspar vs prozac, different MOA. As buspar may be effective sooner, pt prefers. Advised starting back on buspar 5 mg BID can increase gradually to 10 mg BID if needed. Discussed trying propranolol 10 mg PRN social situations. Rx #10 xanax 0.25 'in case of emergency' for anxiety. Ideally would not refill.  F/u 6-8 weeks      Relevant Medications   busPIRone (BUSPAR) 5 MG tablet   propranolol (INDERAL) 10 MG tablet   ALPRAZolam (XANAX) 0.25 MG tablet   Finger stiffness, right    No abnormalities on exam Advised could be a tendon issue although arthritis is possible. Advised she monitor, can use warm compress.      Subacute cough    Lungs CTA Advised adding antihistamine-- zyretec or claritin         Return in about 6 weeks (around 12/01/2022) for anxiety, CPE.      I, Tricia Kirschner, PA-C have reviewed all documentation for this visit. The documentation on  10/20/2022 for the exam, diagnosis, procedures, and orders are all accurate and complete.  Tricia Kirschner, PA-C [facility] [address] [place], [place],  [address] Office: [phone] Fax: [place]

## 2022-10-24 ENCOUNTER — Other Ambulatory Visit: Payer: Self-pay | Admitting: Physician Assistant

## 2022-10-24 DIAGNOSIS — E559 Vitamin D deficiency, unspecified: Secondary | ICD-10-CM

## 2022-11-06 ENCOUNTER — Other Ambulatory Visit: Payer: Self-pay | Admitting: Surgery

## 2022-11-06 DIAGNOSIS — Z853 Personal history of malignant neoplasm of breast: Secondary | ICD-10-CM

## 2022-11-11 ENCOUNTER — Other Ambulatory Visit: Payer: Self-pay | Admitting: Physician Assistant

## 2022-11-11 DIAGNOSIS — F4010 Social phobia, unspecified: Secondary | ICD-10-CM

## 2022-11-21 ENCOUNTER — Other Ambulatory Visit (HOSPITAL_COMMUNITY)
Admission: RE | Admit: 2022-11-21 | Discharge: 2022-11-21 | Disposition: A | Discharge status: Home / Self Care | Payer: BC Managed Care – PPO | Source: Ambulatory Visit | Attending: Physician Assistant | Admitting: Physician Assistant

## 2022-11-21 ENCOUNTER — Encounter: Payer: Self-pay | Admitting: Physician Assistant

## 2022-11-21 ENCOUNTER — Ambulatory Visit (INDEPENDENT_AMBULATORY_CARE_PROVIDER_SITE_OTHER): Payer: BC Managed Care – PPO | Admitting: Physician Assistant

## 2022-11-21 VITALS — BP 132/74 | HR 71 | Temp 97.7°F | Resp 16 | Ht 65.0 in | Wt 135.5 lb

## 2022-11-21 DIAGNOSIS — F4010 Social phobia, unspecified: Secondary | ICD-10-CM

## 2022-11-21 DIAGNOSIS — R739 Hyperglycemia, unspecified: Secondary | ICD-10-CM

## 2022-11-21 DIAGNOSIS — Z0000 Encounter for general adult medical examination without abnormal findings: Secondary | ICD-10-CM

## 2022-11-21 DIAGNOSIS — Z124 Encounter for screening for malignant neoplasm of cervix: Secondary | ICD-10-CM | POA: Insufficient documentation

## 2022-11-21 DIAGNOSIS — E7800 Pure hypercholesterolemia, unspecified: Secondary | ICD-10-CM

## 2022-11-21 MED ORDER — FLUOXETINE HCL 20 MG PO CAPS: 20.0000 mg | ORAL_CAPSULE | Freq: Every day | ORAL | 3 refills | Status: DC

## 2022-11-21 NOTE — Assessment & Plan Note (Signed)
Historically will check a1c 

## 2022-11-21 NOTE — Assessment & Plan Note (Signed)
No improvement with 10 mg buspar in AM and 5 mg in PM  No change at all on propranolol. Pt has not used xanax   Advised adding prozac 20 mg back in . F/u 6 week or earlier if needed

## 2022-11-21 NOTE — Progress Notes (Signed)
I,Joseline E Rosas,acting as a scribe for [employer], PA-C.,have documented all relevant documentation on the behalf of [name], PA-C,as directed by  [name], PA-C while in the presence of [name], PA-C.   Complete physical exam   Patient: Tricia Rivera   DOB: 04/02/1961   61 y.o. Female  MRN: [id number] Visit Date: 11/21/2022  Today's healthcare provider: [name], PA-C   Chief Complaint  Patient presents with   Annual Exam   Subjective    Tricia Rivera is a 61 y.o. female who presents today for a complete physical exam.  She reports consuming a general diet. Home exercise routine includes treadmill and walks 2-3 miles 2-3 times a week. She generally feels well. She reports sleeping well. She does have additional problems to discuss today.  HPI  Anxiety -No improvement with buspar and propranolol. She has not taken the xanax.    Past Medical History:  Diagnosis Date   ADD (attention deficit disorder)    Anxiety    Depression    Family history of breast cancer    Family history of liver cancer    Family history of pancreatic cancer    Hyperlipidemia    Malignant neoplasm of left breast ([place])    Psoriasis    Vitamin D deficiency    Past Surgical History:  Procedure Laterality Date   BREAST BIOPSY Left 01/08/2022   [place] bs, venus marker, invasive mammary   BREAST LUMPECTOMY WITH SENTINEL LYMPH NODE BIOPSY Left 01/24/2022   Procedure: BREAST LUMPECTOMY WITH SENTINEL AXIILLARY LYMPH NODE BX;  Surgeon: Robert Bellow, MD;  Location: [facility] ORS;  Service: General;  Laterality: Left;   BUNIONECTOMY  12/01/2008   BUNIONECTOMY N/A    2015   COLONOSCOPY     COLONOSCOPY WITH PROPOFOL N/A 07/16/2022   Procedure: COLONOSCOPY WITH PROPOFOL;  Surgeon: Robert Bellow, MD;  Location: [facility] ENDOSCOPY;  Service: Endoscopy;  Laterality: N/A;   Social History   Socioeconomic History   Marital status: Married    Spouse name: Not on file    Number of children: Not on file   Years of education: Not on file   Highest education level: Not on file  Occupational History   Not on file  Tobacco Use   Smoking status: Never   Smokeless tobacco: Never  Vaping Use   Vaping Use: Never used  Substance and Sexual Activity   Alcohol use: Not Currently    Alcohol/week: 3.0 standard drinks of alcohol    Types: 3 Glasses of wine per week   Drug use: No   Sexual activity: Not Currently  Other Topics Concern   Not on file  Social History Narrative   Lives in [place]; with husband; son' grandson. Daughter- in [place]. 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    Social Determinants of Health   Financial Resource Strain: Not on file  Food Insecurity: Not on file  Transportation Needs: Not on file  Physical Activity: Not on file  Stress: Not on file  Social Connections: Not on file  Intimate Partner Violence: Not on file   Family Status  Relation Name Status   Mother  Deceased at age 29-Jan-2015       died from pancreatic cancer   Father  Deceased at age 66       Liver Cancer   Sister  Alive   Sister  Brillion  Deceased   MGF  Deceased  died of liver cancer.   PGF  Deceased   Family History  Problem Relation Age of Onset   Pancreatic cancer Mother    Liver cancer Father    Breast cancer Maternal Aunt    Liver cancer Maternal Grandfather    No Known Allergies  Patient Care Team: [name], PA-C as PCP - General (Physician Assistant) Theodore Demark, RN (Inactive) as Oncology Nurse Navigator Cammie Sickle, MD as Consulting Physician (Oncology) Bary Castilla, Forest Gleason, MD as Consulting Physician (General Surgery) Noreene Filbert, MD as Consulting Physician (Radiation Oncology)   Medications: Outpatient Medications Prior to Visit  Medication Sig   acetaminophen (TYLENOL) 325 MG tablet Take 650 mg by mouth every 4 (four) hours as needed for mild pain.   ALPRAZolam (XANAX) 0.25 MG  tablet Take 1 tablet (0.25 mg total) by mouth daily as needed for anxiety (for severe anxiety).   busPIRone (BUSPAR) 5 MG tablet TAKE 1 TABLET BY MOUTH TWICE A DAY   clobetasol ointment (TEMOVATE) 4.68 % Apply 1 Application topically 2 (two) times daily.   hydrocortisone (ANUSOL-HC) 25 MG suppository Place 25 mg rectally 2 (two) times daily.   Ibuprofen 200 MG CAPS Take by mouth.   letrozole (FEMARA) 2.5 MG tablet Take 1 tablet (2.5 mg total) by mouth daily.   levothyroxine (SYNTHROID) 50 MCG tablet TAKE 1 TABLET BY MOUTH EVERY DAY   traMADol (ULTRAM) 50 MG tablet Take 50 mg by mouth as needed for moderate pain.   triamcinolone cream (KENALOG) 0.1 % APPLY TWICE A DAY TO AFFECTED AREAS (Patient taking differently: Apply 1 application  topically 2 (two) times daily as needed. APPLY TWICE A DAY TO AFFECTED AREAS)   Vitamin D, Ergocalciferol, (DRISDOL) 1.25 MG (50000 UNIT) CAPS capsule TAKE 1 CAPSULE (50,000 UNITS TOTAL) BY MOUTH EVERY 7 (SEVEN) DAYS   ZORYVE 0.3 % CREA Apply 1 application  topically daily as needed (Psoriasis).   [DISCONTINUED] propranolol (INDERAL) 10 MG tablet Take 30-60 minutes before anxiety producing situations, can take up to three times a day   [DISCONTINUED] anastrozole (ARIMIDEX) 1 MG tablet Take 1 mg by mouth daily.   No facility-administered medications prior to visit.    Review of Systems  Psychiatric/Behavioral:  The patient is nervous/anxious.   All other systems reviewed and are negative.   Objective    BP 132/74 (BP Location: Right Arm, Patient Position: Sitting, Cuff Size: Normal)   Pulse 71   Temp 97.7 F (36.5 C) (Oral)   Resp 16   Ht '5\' 5"'$  (1.651 m)   Wt 135 lb 8 oz (61.5 kg)   BMI 22.55 kg/m     Physical Exam Constitutional:      General: She is awake.     Appearance: She is well-developed. She is not ill-appearing.  HENT:     Head: Normocephalic.     Right Ear: Tympanic membrane normal.     Left Ear: Tympanic membrane normal.     Nose:  Nose normal. No congestion or rhinorrhea.     Mouth/Throat:     Pharynx: No oropharyngeal exudate or posterior oropharyngeal erythema.  Eyes:     Conjunctiva/sclera: Conjunctivae normal.     Pupils: Pupils are equal, round, and reactive to light.  Neck:     Thyroid: No thyroid mass or thyromegaly.  Cardiovascular:     Rate and Rhythm: Normal rate and regular rhythm.     Heart sounds: Normal heart sounds.  Pulmonary:     Effort: Pulmonary effort is  normal.     Breath sounds: Normal breath sounds.  Abdominal:     Palpations: Abdomen is soft.     Tenderness: There is no abdominal tenderness.  Genitourinary:    Labia:        Right: No rash or lesion.        Left: No rash or lesion.      Vagina: Normal.     Cervix: Normal.  Musculoskeletal:     Right lower leg: No swelling. No edema.     Left lower leg: No swelling. No edema.  Lymphadenopathy:     Cervical: No cervical adenopathy.  Skin:    General: Skin is warm.  Neurological:     Mental Status: She is alert and oriented to person, place, and time.  Psychiatric:        Attention and Perception: Attention normal.        Mood and Affect: Mood normal.        Speech: Speech normal.        Behavior: Behavior normal. Behavior is cooperative.     Last depression screening scores    11/21/2022    8:18 AM 10/20/2022   11:01 AM 11/21/2021    9:31 AM  PHQ 2/9 Scores  PHQ - 2 Score 0 1 0  PHQ- 9 Score 0 6 0   Last fall risk screening    11/21/2022    8:18 AM  [place] in the past year? 0  Number falls in past yr: 0  Injury with Fall? 0  Risk for fall due to : No Fall Risks   Last Audit-C alcohol use screening    11/21/2022    8:19 AM  Alcohol Use Disorder Test (AUDIT)  1. How often do you have a drink containing alcohol? 0  2. How many drinks containing alcohol do you have on a typical day when you are drinking? 0  3. How often do you have six or more drinks on one occasion? 0  AUDIT-C Score 0   A score  of 3 or more in women, and 4 or more in men indicates increased risk for alcohol abuse, EXCEPT if all of the points are from question 1   No results found for any visits on 11/21/22.  Assessment & Plan    Routine Health Maintenance and Physical Exam  Exercise Activities and Dietary recommendations --balanced diet high in fiber and protein, low in sugars, carbs, fats. --physical activity/exercise 30 minutes 3-5 times a week     Immunization History  Administered Date(s) Administered   Influenza Inj Mdck Quad Pf 09/09/2021   Influenza,inj,Quad PF,6+ Mos 09/13/2019   Moderna Sars-Covid-2 Vaccination 10/01/2020   PFIZER(Purple Top)SARS-COV-2 Vaccination 03/05/2020, 03/27/2020   Td 02/03/2011, 06/08/2017   Tdap 08/10/2007, 01/31/2011    Health Maintenance  Topic Date Due   Zoster Vaccines- Shingrix (1 of 2) Never done   PAP SMEAR-Modifier  06/08/2022   INFLUENZA VACCINE  07/01/2022   COVID-19 Vaccine (4 - 2023-24 season) 08/01/2022   MAMMOGRAM  12/14/2023   DTaP/Tdap/Td (5 - Td or Tdap) 06/09/2027   COLONOSCOPY (Pts 45-85yr Insurance coverage will need to be confirmed)  07/16/2032   Hepatitis C Screening  Completed   HIV Screening  Completed   HPV VACCINES  Aged Out    Discussed health benefits of physical activity, and encouraged her to engage in regular exercise appropriate for her age and condition.  Problem List Items Addressed This Visit  Other   Hyperglycemia    Historically will check a1c      Relevant Orders   HgB A1c   Hypercholesteremia    The 10-year ASCVD risk score (Arnett DK, et al., 2019) is: 4.3% Repeat fasting lipids      Relevant Orders   Lipid panel   Comprehensive Metabolic Panel (CMET)   Social anxiety disorder    No improvement with 10 mg buspar in AM and 5 mg in PM  No change at all on propranolol. Pt has not used xanax   Advised adding prozac 20 mg back in . F/u 6 week or earlier if needed       Relevant Medications    FLUoxetine (PROZAC) 20 MG capsule   Other Visit Diagnoses     Encounter for annual physical exam    -  Primary   Cervical cancer screening       Relevant Orders   Cytology - PAP        Return in about 6 weeks (around 01/02/2023) for anxiety.     I, [name], PA-C have reviewed all documentation for this visit. The documentation on  11/21/2022  for the exam, diagnosis, procedures, and orders are all accurate and complete.  [name], PA-C [facility] [address] [place], [place], [address] Office: [phone] Fax: [place]

## 2022-11-21 NOTE — Assessment & Plan Note (Signed)
The 10-year ASCVD risk score (Arnett DK, et al., 2019) is: 4.3% Repeat fasting lipids

## 2022-11-22 LAB — COMPREHENSIVE METABOLIC PANEL
ALT: 34 IU/L — ABNORMAL HIGH (ref 0–32)
AST: 30 IU/L (ref 0–40)
Albumin/Globulin Ratio: 2 (ref 1.2–2.2)
Albumin: 4.5 g/dL (ref 3.9–4.9)
Alkaline Phosphatase: 65 IU/L (ref 44–121)
BUN/Creatinine Ratio: 19 (ref 12–28)
BUN: 16 mg/dL (ref 8–27)
Bilirubin Total: 0.3 mg/dL (ref 0.0–1.2)
CO2: 22 mmol/L (ref 20–29)
Calcium: 9.5 mg/dL (ref 8.7–10.3)
Chloride: 101 mmol/L (ref 96–106)
Creatinine, Ser: 0.85 mg/dL (ref 0.57–1.00)
Globulin, Total: 2.2 g/dL (ref 1.5–4.5)
Glucose: 100 mg/dL — ABNORMAL HIGH (ref 70–99)
Potassium: 4.6 mmol/L (ref 3.5–5.2)
Sodium: 139 mmol/L (ref 134–144)
Total Protein: 6.7 g/dL (ref 6.0–8.5)
eGFR: 77 mL/min/{1.73_m2} (ref >59)

## 2022-11-22 LAB — LIPID PANEL
Chol/HDL Ratio: 3.2 ratio (ref 0.0–4.4)
Cholesterol, Total: 268 mg/dL — ABNORMAL HIGH (ref 100–199)
HDL: 84 mg/dL (ref >39)
LDL Chol Calc (NIH): 163 mg/dL — ABNORMAL HIGH (ref 0–99)
Triglycerides: 119 mg/dL (ref 0–149)
VLDL Cholesterol Cal: 21 mg/dL (ref 5–40)

## 2022-11-22 LAB — HEMOGLOBIN A1C
Est. average glucose Bld gHb Est-mCnc: 120 mg/dL
Hgb A1c MFr Bld: 5.8 % — ABNORMAL HIGH (ref 4.8–5.6)

## 2022-11-25 ENCOUNTER — Encounter: Payer: BC Managed Care – PPO | Admitting: Physician Assistant

## 2022-11-25 NOTE — Progress Notes (Signed)
Cholesterol remains elevated with significant elevation in bad/LDL remaining. Heart attack and stroke risk moderate at 5% in 10 years. 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 Continue to recommend balanced, lower carb meals. Smaller meal size, adding snacks. Choosing water as drink of choice and increasing purposeful exercise.  Covering for [employer] PA-C Gwyneth Sprout, [place] [address] [place], [place] [address] [phone] (phone) [phone] (fax) [place]

## 2022-11-26 LAB — CYTOLOGY - PAP
Comment: NEGATIVE
Diagnosis: NEGATIVE
High risk HPV: NEGATIVE

## 2022-11-26 NOTE — Progress Notes (Signed)
[place],  Normal PAP smear; repeat in 3-5 years.  Please let us know if you have any questions.  Thank you,  Tally Joe, FNP

## 2022-12-16 ENCOUNTER — Ambulatory Visit
Admission: RE | Admit: 2022-12-16 | Discharge: 2022-12-16 | Disposition: A | Discharge status: Home / Self Care | Payer: BC Managed Care – PPO | Source: Ambulatory Visit | Attending: Surgery | Admitting: Surgery

## 2022-12-16 DIAGNOSIS — Z853 Personal history of malignant neoplasm of breast: Secondary | ICD-10-CM

## 2022-12-16 HISTORY — DX: Personal history of irradiation: Z92.3

## 2022-12-26 ENCOUNTER — Ambulatory Visit: Payer: BC Managed Care – PPO | Admitting: Physician Assistant

## 2022-12-30 ENCOUNTER — Ambulatory Visit: Payer: BC Managed Care – PPO | Admitting: Physician Assistant

## 2022-12-30 ENCOUNTER — Encounter: Payer: Self-pay | Admitting: Physician Assistant

## 2022-12-30 VITALS — BP 123/80 | HR 88 | Ht 64.0 in | Wt 135.8 lb

## 2022-12-30 DIAGNOSIS — F4010 Social phobia, unspecified: Secondary | ICD-10-CM | POA: Diagnosis not present

## 2022-12-30 DIAGNOSIS — E785 Hyperlipidemia, unspecified: Secondary | ICD-10-CM | POA: Diagnosis not present

## 2022-12-30 DIAGNOSIS — E034 Atrophy of thyroid (acquired): Secondary | ICD-10-CM

## 2022-12-30 MED ORDER — BUSPIRONE HCL 5 MG PO TABS: 5.0000 mg | ORAL_TABLET | Freq: Every day | ORAL | 1 refills | Status: DC

## 2022-12-30 MED ORDER — BUSPIRONE HCL 10 MG PO TABS: 10.0000 mg | ORAL_TABLET | Freq: Every morning | ORAL | 1 refills | Status: DC

## 2022-12-30 NOTE — Assessment & Plan Note (Signed)
Can repeat level in 2-3 mo when checking cholesterol, for stability After stable for 1 year can check annually

## 2022-12-30 NOTE — Assessment & Plan Note (Signed)
Repeat fasting lipids in 2-3 mo,advised pt of diet/lifestyle changes

## 2022-12-30 NOTE — Assessment & Plan Note (Signed)
Pt feels stable with buspar 10 mg in AM.  Discussed trying adding back 10 mg in am and 5 mg in PM Prozac caused fatgue, d/c Pt feeling well, f/u 3-6 mo or earlier if needed

## 2022-12-30 NOTE — Progress Notes (Signed)
I,Sha'taria Tyson,acting as a Education administrator for [employer], PA-C.,have documented all relevant documentation on the behalf of Tricia Kirschner, PA-C,as directed by  Tricia Kirschner, PA-C while in the presence of Tricia Kirschner, PA-C.   Established patient visit   Patient: Tricia Rivera   DOB: Mar 25, 1961   62 y.o. Female  MRN: [id number] Visit Date: 12/30/2022  Today's healthcare provider: [name], PA-C  Cc. Anxiety f/u  Subjective    HPI   Anxiety, Follow-up  She was last seen for anxiety 6 weeks ago. Changes made at last visit include adding prozac 20 mg back in .   And d/c prozac after 2 days-- reports it made her sleepy. She reports fair compliance with treatment. Patient is taking 2 tablets once daily She reports fair tolerance of treatment. She is having side effects.    She feels her anxiety is mild  and Improved since last visit. Feels well controlled on buspar.  Symptoms: No chest pain Yes difficulty concentrating  No dizziness No fatigue  No feelings of losing control No insomnia  No irritable No palpitations  No panic attacks No racing thoughts  No shortness of breath No sweating  No tremors/shakes    GAD-7 Results    12/30/2022    8:20 AM 10/20/2022   10:59 AM  GAD-7 Generalized Anxiety Disorder Screening Tool  1. Feeling Nervous, Anxious, or on Edge 1   2. Not Being Able to Stop or Control Worrying 0   3. Worrying Too Much About Different Things 0 0  4. Trouble Relaxing 0   5. Being So Restless it's Hard To Sit Still 1 0  6. Becoming Easily Annoyed or Irritable 0   7. Feeling Afraid As If Something Awful Might Happen 0   Total GAD-7 Score 2   Difficulty At Work, Home, or Getting  Along With Others? Not difficult at all Very difficult    PHQ-9 Scores    12/30/2022    8:20 AM 11/21/2022    8:18 AM 10/20/2022   11:01 AM  PHQ9 SCORE ONLY  PHQ-9 Total Score 3 0 6     ---------------------------------------------------------------------------------------------------   Medications: Outpatient Medications Prior to Visit  Medication Sig   acetaminophen (TYLENOL) 325 MG tablet Take 650 mg by mouth every 4 (four) hours as needed for mild pain.   clobetasol ointment (TEMOVATE) 0.07 % Apply 1 Application topically 2 (two) times daily.   Ibuprofen 200 MG CAPS Take by mouth.   letrozole (FEMARA) 2.5 MG tablet Take 1 tablet (2.5 mg total) by mouth daily.   levothyroxine (SYNTHROID) 50 MCG tablet TAKE 1 TABLET BY MOUTH EVERY DAY   traMADol (ULTRAM) 50 MG tablet Take 50 mg by mouth as needed for moderate pain.   triamcinolone cream (KENALOG) 0.1 % APPLY TWICE A DAY TO AFFECTED AREAS (Patient taking differently: Apply 1 application  topically 2 (two) times daily as needed. APPLY TWICE A DAY TO AFFECTED AREAS)   Vitamin D, Ergocalciferol, (DRISDOL) 1.25 MG (50000 UNIT) CAPS capsule TAKE 1 CAPSULE (50,000 UNITS TOTAL) BY MOUTH EVERY 7 (SEVEN) DAYS   ZORYVE 0.3 % CREA Apply 1 application  topically daily as needed (Psoriasis).   [DISCONTINUED] busPIRone (BUSPAR) 5 MG tablet TAKE 1 TABLET BY MOUTH TWICE A DAY (Patient taking differently: Take 5 mg by mouth 2 (two) times daily. Take 2 tablets in the morning)   hydrocortisone (ANUSOL-HC) 25 MG suppository Place 25 mg rectally 2 (two) times daily. (Patient not taking: Reported on 12/30/2022)   [  DISCONTINUED] ALPRAZolam (XANAX) 0.25 MG tablet Take 1 tablet (0.25 mg total) by mouth daily as needed for anxiety (for severe anxiety). (Patient not taking: Reported on 12/30/2022)   [DISCONTINUED] FLUoxetine (PROZAC) 20 MG capsule Take 1 capsule (20 mg total) by mouth daily. (Patient not taking: Reported on 12/30/2022)   No facility-administered medications prior to visit.    Review of Systems  Constitutional:  Negative for fatigue and fever.  Respiratory:  Negative for cough and shortness of breath.   Cardiovascular:  Negative  for chest pain and leg swelling.  Gastrointestinal:  Negative for abdominal pain.  Neurological:  Negative for dizziness and headaches.  Psychiatric/Behavioral:  The patient is nervous/anxious.       Objective    Blood pressure 123/80, pulse 88, height '5\' 4"'$  (1.626 m), weight 135 lb 12.8 oz (61.6 kg), SpO2 98 %.   Physical Exam Vitals reviewed.  Constitutional:      Appearance: She is not ill-appearing.  HENT:     Head: Normocephalic.  Eyes:     Conjunctiva/sclera: Conjunctivae normal.  Cardiovascular:     Rate and Rhythm: Normal rate.  Pulmonary:     Effort: Pulmonary effort is normal. No respiratory distress.  Neurological:     General: No focal deficit present.     Mental Status: She is alert and oriented to person, place, and time.  Psychiatric:        Mood and Affect: Mood normal.        Behavior: Behavior normal.     No results found for any visits on 12/30/22.  Assessment & Plan     Problem List Items Addressed This Visit       Endocrine   Hypothyroidism due to acquired atrophy of thyroid    Can repeat level in 2-3 mo when checking cholesterol, for stability After stable for 1 year can check annually      Relevant Orders   TSH + free T4     Other   Hyperlipidemia    Repeat fasting lipids in 2-3 mo,advised pt of diet/lifestyle changes      Relevant Orders   Lipid panel   Comprehensive Metabolic Panel (CMET)   Social anxiety disorder - Primary    Pt feels stable with buspar 10 mg in AM.  Discussed trying adding back 10 mg in am and 5 mg in PM Prozac caused fatgue, d/c Pt feeling well, f/u 3-6 mo or earlier if needed      Relevant Medications   busPIRone (BUSPAR) 10 MG tablet   busPIRone (BUSPAR) 5 MG tablet   Return in about 3 months (around 03/31/2023).     I, Tricia Kirschner, PA-C have reviewed all documentation for this visit. The documentation on  12/30/22  for the exam, diagnosis, procedures, and orders are all accurate and  complete.  Tricia Kirschner, PA-C [facility] [address] [place], [place], [address] Office: [phone] Fax: [place]

## 2023-01-15 ENCOUNTER — Encounter: Payer: Self-pay | Admitting: Internal Medicine

## 2023-02-12 IMAGING — MG MM BREAST LOCALIZATION CLIP
4 series · 4 of 12 positions shown · non-contrast
Comparison: Previous exam(s).

CLINICAL DATA: Left breast 9 o'clock mass

EXAM:
3D DIAGNOSTIC LEFT MAMMOGRAM POST ULTRASOUND BIOPSY

[L ML synth-2D]
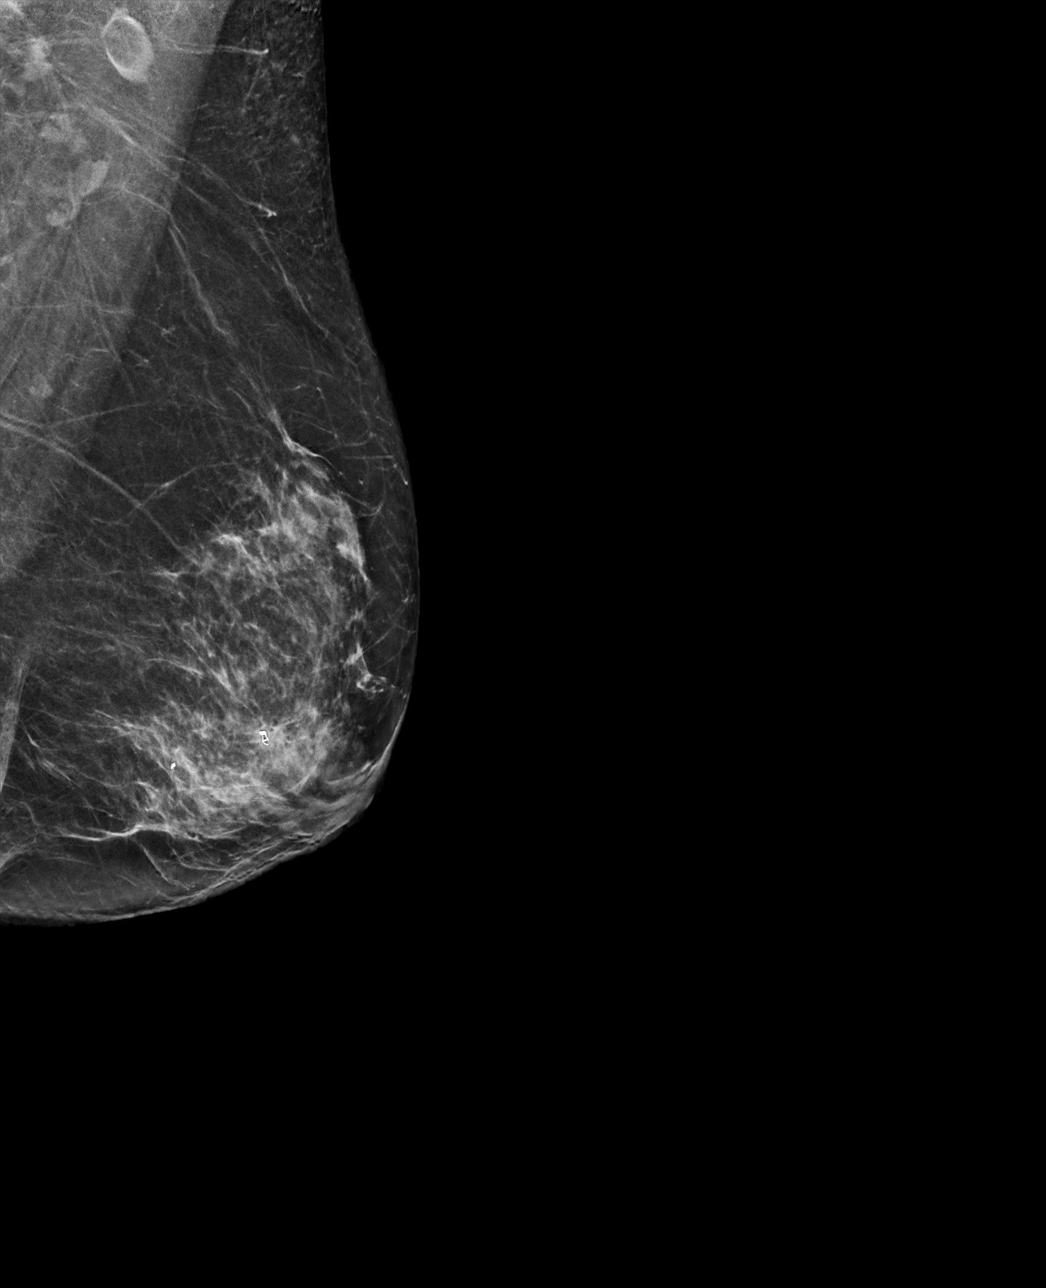

[L CC synth-2D]
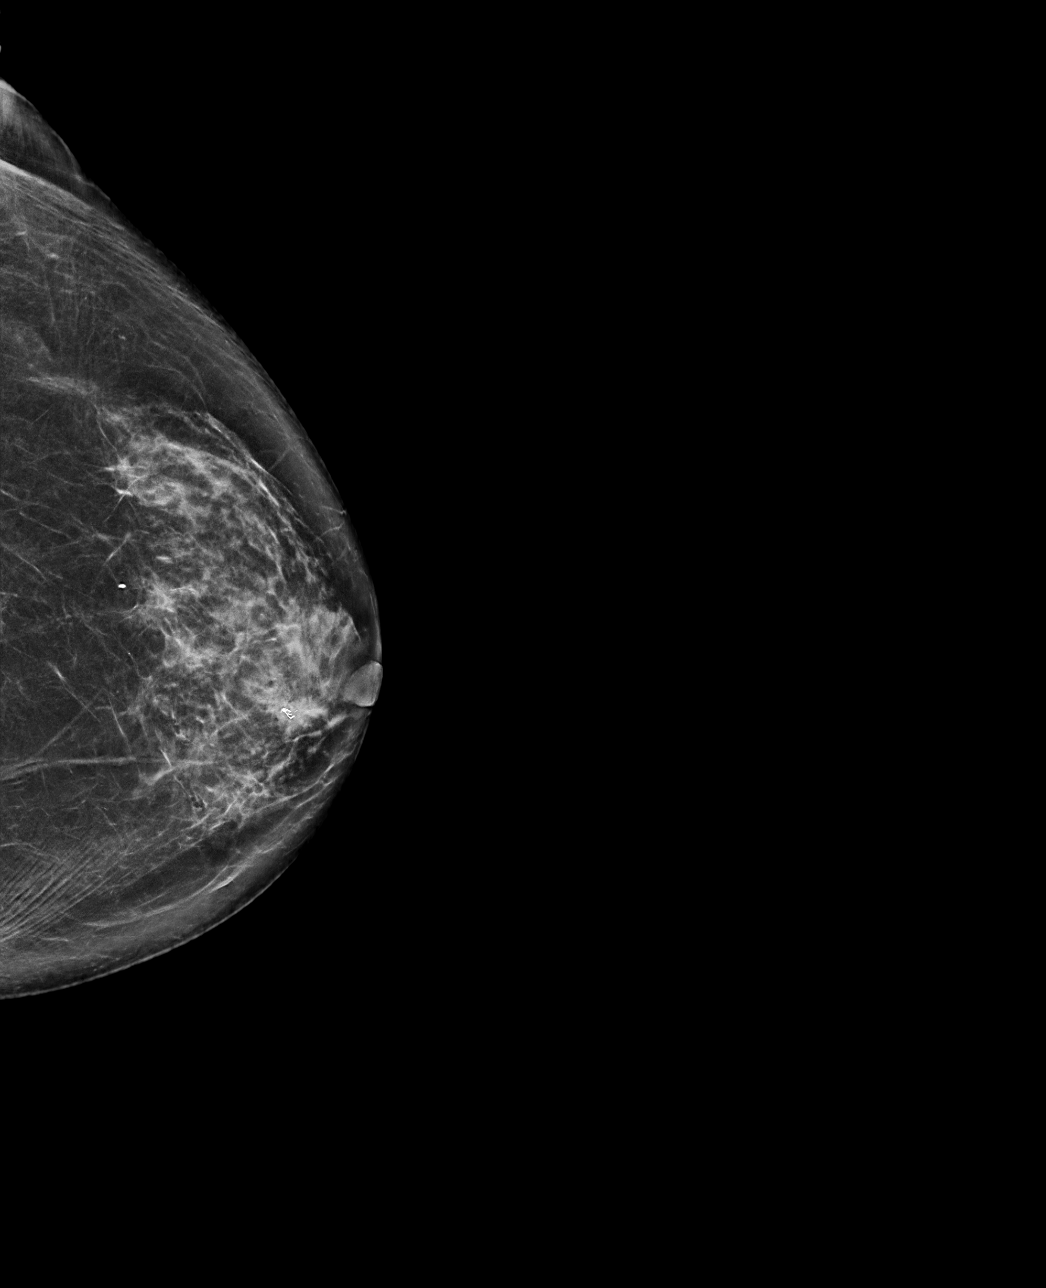

[L ML tomo · tomo slice 33/66.0]
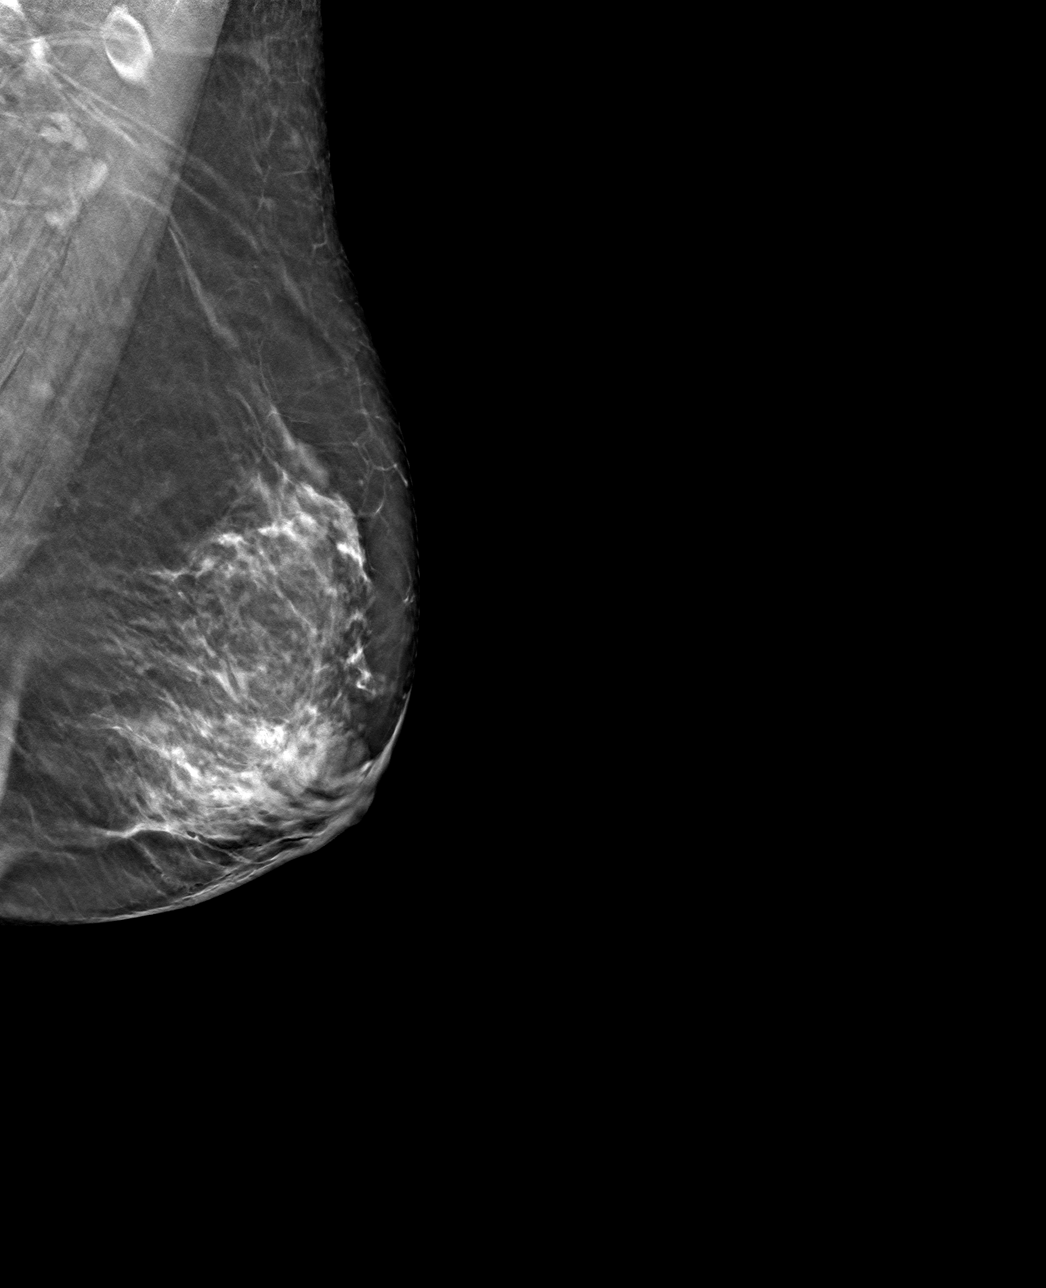

[L CC tomo · tomo slice 35/69.0]
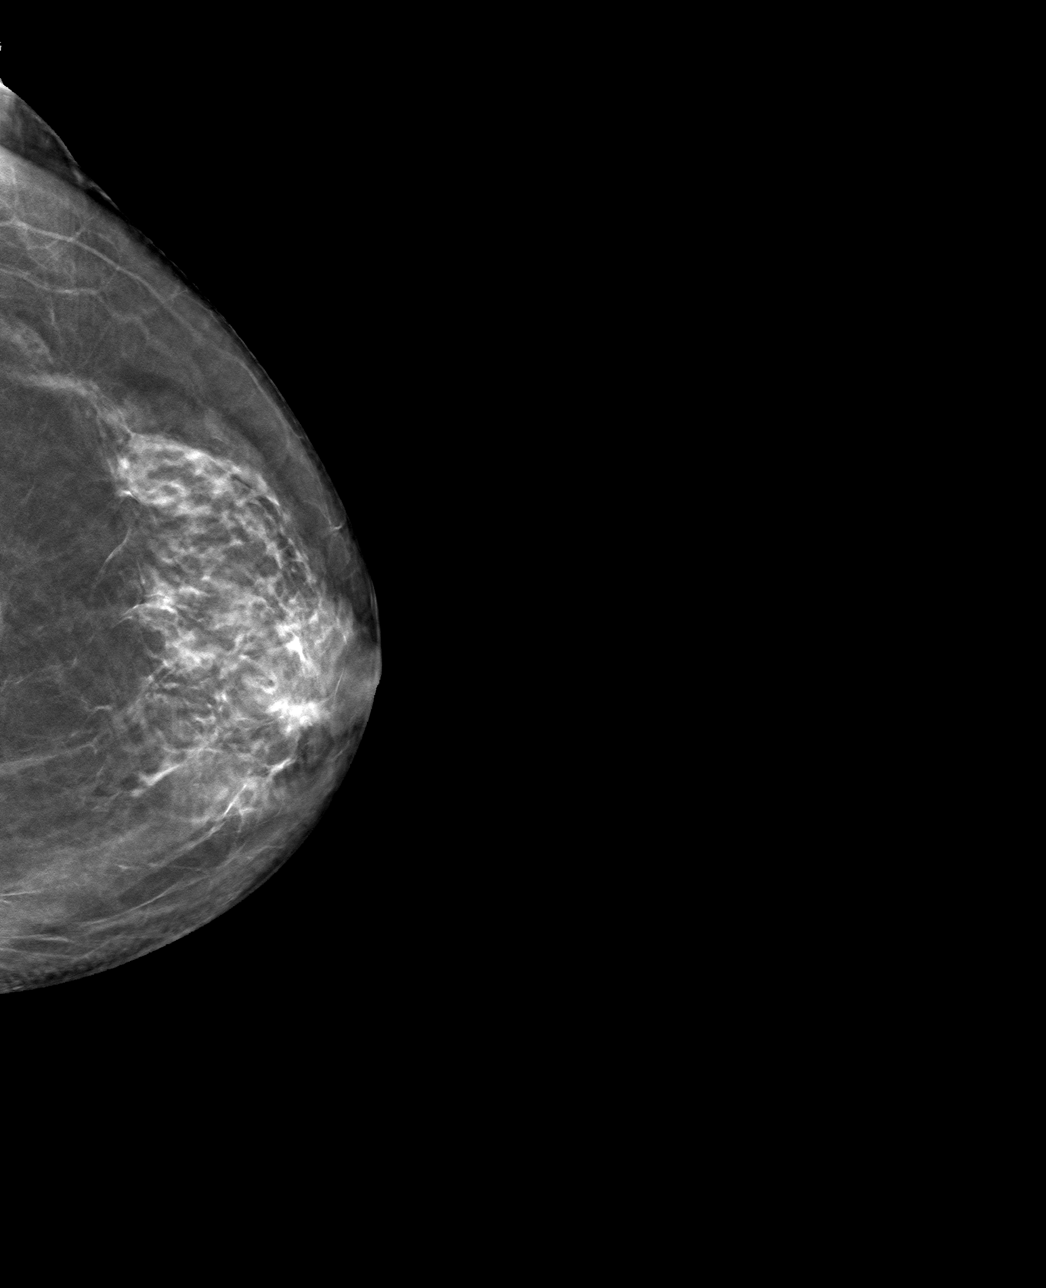

[4 of 12 positions shown; findings below may reference images not displayed]

FINDINGS: 3D Mammographic images were obtained following ultrasound guided
biopsy of left breast 9 o'clock mass. The biopsy marking clip is in
expected position at the site of biopsy.
IMPRESSION: Appropriate positioning of the Armando shaped biopsy marking clip at
the site of biopsy in the left breast 9 o'clock.

Final Assessment: Post Procedure Mammograms for Marker Placement

## 2023-02-26 NOTE — Progress Notes (Deleted)
I,J'ya E Hunter,acting as a scribe for [employer], PA-C.,have documented all relevant documentation on the behalf of [name], PA-C,as directed by  [name], PA-C while in the presence of [name], PA-C.   Established patient visit   Patient: Tricia Rivera   DOB: [birth year]   62 y.o. Female  MRN: [id number] Visit Date: 02/27/2023  Today's healthcare provider: [name], PA-C   Chief Complaint  Patient presents with   Anxiety    Social Anxiety Disorder   Subjective    HPI  Social Anxiety Disorder, Follow-up  She was last seen for anxiety 2 months ago. Changes made at last visit include buspar 10 mg in AM and 5 mg in PM.   She reports fair compliance with treatment. Reports 5mg  in PM PRN.  She reports good tolerance of treatment. She is not having side effects.   She feels her anxiety is moderate and Improved since last visit.  Symptoms: Yes chest pain Yes difficulty concentrating  No dizziness Yes fatigue  No feelings of losing control No insomnia  No irritable No palpitations  No panic attacks No racing thoughts  No shortness of breath No sweating  No tremors/shakes    GAD-7 Results    12/30/2022    8:20 AM 10/20/2022   10:59 AM  GAD-7 Generalized Anxiety Disorder Screening Tool  1. Feeling Nervous, Anxious, or on Edge 1   2. Not Being Able to Stop or Control Worrying 0   3. Worrying Too Much About Different Things 0 0  4. Trouble Relaxing 0   5. Being So Restless it's Hard To Sit Still 1 0  6. Becoming Easily Annoyed or Irritable 0   7. Feeling Afraid As If Something Awful Might Happen 0   Total GAD-7 Score 2   Difficulty At Work, Home, or Getting  Along With Others? Not difficult at all Very difficult    PHQ-9 Scores    12/30/2022    8:20 AM 11/21/2022    8:18 AM 10/20/2022   11:01 AM  PHQ9 SCORE ONLY  PHQ-9 Total Score 3 0 6     ---------------------------------------------------------------------------------------------------   Medications: Outpatient Medications Prior to Visit  Medication Sig   acetaminophen (TYLENOL) 325 MG tablet Take 650 mg by mouth every 4 (four) hours as needed for mild pain.   busPIRone (BUSPAR) 10 MG tablet Take 1 tablet (10 mg total) by mouth in the morning. Take with 5 mg in evening   busPIRone (BUSPAR) 5 MG tablet Take 1 tablet (5 mg total) by mouth at bedtime.   clobetasol ointment (TEMOVATE) [id number] % Apply 1 Application topically 2 (two) times daily.   Ibuprofen 200 MG CAPS Take by mouth.   letrozole (FEMARA) 2.5 MG tablet Take 1 tablet (2.5 mg total) by mouth daily.   levothyroxine (SYNTHROID) 50 MCG tablet TAKE 1 TABLET BY MOUTH EVERY DAY   triamcinolone cream (KENALOG) 0.1 % APPLY TWICE A DAY TO AFFECTED AREAS (Patient taking differently: Apply 1 application  topically 2 (two) times daily as needed. APPLY TWICE A DAY TO AFFECTED AREAS)   Vitamin D, Ergocalciferol, (DRISDOL) 1.25 MG (50000 UNIT) CAPS capsule TAKE 1 CAPSULE (50,000 UNITS TOTAL) BY MOUTH EVERY 7 (SEVEN) DAYS   ZORYVE 0.3 % CREA Apply 1 application  topically daily as needed (Psoriasis).   hydrocortisone (ANUSOL-HC) 25 MG suppository Place 25 mg rectally 2 (two) times daily. (Patient not taking: Reported on 12/30/2022)   traMADol (ULTRAM) 50 MG tablet Take 50  mg by mouth as needed for moderate pain. (Patient not taking: Reported on 02/27/2023)   No facility-administered medications prior to visit.    Review of Systems  Musculoskeletal:  Positive for neck stiffness.  Neurological:  Facial asymmetry: chest pain.     Objective    BP 133/89 (BP Location: Right Arm, Patient Position: Sitting, Cuff Size: Normal)   Pulse 74   Temp 98 F (36.7 C) (Oral)   Ht 5\' 4"  (1.626 m)   Wt 135 lb 1.6 oz (61.3 kg)   SpO2 97%   BMI 23.19 kg/m   Physical Exam  ***  No results found for any visits on 02/27/23.  Assessment &  Plan     ***  No follow-ups on file.      I, [name], PA-C have reviewed all documentation for this visit. The documentation on  3/29/24for the exam, diagnosis, procedures, and orders are all accurate and complete.  [name], PA-C [facility] [address] [place], [place], [address] Office: [phone] Fax: [place]

## 2023-02-27 ENCOUNTER — Ambulatory Visit: Payer: BC Managed Care – PPO | Admitting: Physician Assistant

## 2023-02-27 ENCOUNTER — Encounter: Payer: Self-pay | Admitting: Physician Assistant

## 2023-02-27 VITALS — BP 133/89 | HR 74 | Temp 98.0°F | Ht 64.0 in | Wt 135.1 lb

## 2023-02-27 DIAGNOSIS — F4010 Social phobia, unspecified: Secondary | ICD-10-CM | POA: Diagnosis not present

## 2023-02-27 DIAGNOSIS — E785 Hyperlipidemia, unspecified: Secondary | ICD-10-CM | POA: Diagnosis not present

## 2023-02-27 NOTE — Assessment & Plan Note (Signed)
Unmedicated, risk is low; The 10-year ASCVD risk score (Arnett DK, et al., 2019) is: 5.2% Advised if LDL is still elevated > 150 would consider low dose statin for cardiovascular protection

## 2023-02-27 NOTE — Progress Notes (Signed)
I,J'ya E Hunter,acting as a scribe for [employer], PA-C.,have documented all relevant documentation on the behalf of [name], PA-C,as directed by  [name], PA-C while in the presence of [name], PA-C.   Established patient visit   Patient: Tricia Rivera   DOB: [birth year]   62 y.o. Female  MRN: [id number] Visit Date: 02/27/2023  Today's healthcare provider: [name], PA-C   Chief Complaint  Patient presents with   Anxiety    Social Anxiety Disorder   Subjective    HPI  Social Anxiety Disorder, Follow-up  She was last seen for anxiety 2 months ago. Changes made at last visit include buspar 10 mg in AM and 5 mg in PM.   She reports fair compliance with treatment. Reports 5mg  in PM PRN.  She reports good tolerance of treatment. She is not having side effects.   She feels her anxiety is moderate and Improved since last visit.  Symptoms: Yes chest pain Yes difficulty concentrating  No dizziness Yes fatigue  No feelings of losing control No insomnia  No irritable No palpitations  No panic attacks No racing thoughts  No shortness of breath No sweating  No tremors/shakes    GAD-7 Results    12/30/2022    8:20 AM 10/20/2022   10:59 AM  GAD-7 Generalized Anxiety Disorder Screening Tool  1. Feeling Nervous, Anxious, or on Edge 1   2. Not Being Able to Stop or Control Worrying 0   3. Worrying Too Much About Different Things 0 0  4. Trouble Relaxing 0   5. Being So Restless it's Hard To Sit Still 1 0  6. Becoming Easily Annoyed or Irritable 0   7. Feeling Afraid As If Something Awful Might Happen 0   Total GAD-7 Score 2   Difficulty At Work, Home, or Getting  Along With Others? Not difficult at all Very difficult    PHQ-9 Scores    02/27/2023    8:28 AM 12/30/2022    8:20 AM 11/21/2022    8:18 AM  PHQ9 SCORE ONLY  PHQ-9 Total Score 2 3 0     --------------------------------------------------------------------------------------------------- Lipid/Cholesterol, Follow-up  Last lipid panel Other pertinent labs  Lab Results  Component Value Date   CHOL 268 (H) 11/21/2022   HDL 84 11/21/2022   LDLCALC 163 (H) 11/21/2022   TRIG 119 11/21/2022   CHOLHDL 3.2 11/21/2022   Lab Results  Component Value Date   ALT 34 (H) 11/21/2022   AST 30 11/21/2022   PLT 310 09/05/2022   TSH 3.050 05/15/2022     Pt would like cholesterol levels repeated.  The 10-year ASCVD risk score (Arnett DK, et al., 2019) is: 5.2%  ---------------------------------------------------------------------------------------------------   Medications: Outpatient Medications Prior to Visit  Medication Sig   acetaminophen (TYLENOL) 325 MG tablet Take 650 mg by mouth every 4 (four) hours as needed for mild pain.   busPIRone (BUSPAR) 10 MG tablet Take 1 tablet (10 mg total) by mouth in the morning. Take with 5 mg in evening   busPIRone (BUSPAR) 5 MG tablet Take 1 tablet (5 mg total) by mouth at bedtime.   clobetasol ointment (TEMOVATE) [id number] % Apply 1 Application topically 2 (two) times daily.   Ibuprofen 200 MG CAPS Take by mouth.   letrozole (FEMARA) 2.5 MG tablet Take 1 tablet (2.5 mg total) by mouth daily.   levothyroxine (SYNTHROID) 50 MCG tablet TAKE 1 TABLET BY MOUTH EVERY DAY   triamcinolone cream (KENALOG) 0.1 %  APPLY TWICE A DAY TO AFFECTED AREAS (Patient taking differently: Apply 1 application  topically 2 (two) times daily as needed. APPLY TWICE A DAY TO AFFECTED AREAS)   Vitamin D, Ergocalciferol, (DRISDOL) 1.25 MG (50000 UNIT) CAPS capsule TAKE 1 CAPSULE (50,000 UNITS TOTAL) BY MOUTH EVERY 7 (SEVEN) DAYS   ZORYVE 0.3 % CREA Apply 1 application  topically daily as needed (Psoriasis).   [DISCONTINUED] hydrocortisone (ANUSOL-HC) 25 MG suppository Place 25 mg rectally 2 (two) times daily. (Patient not taking: Reported on 12/30/2022)   [DISCONTINUED]  traMADol (ULTRAM) 50 MG tablet Take 50 mg by mouth as needed for moderate pain. (Patient not taking: Reported on 02/27/2023)   No facility-administered medications prior to visit.    Review of Systems  Musculoskeletal:  Positive for neck stiffness.  Neurological:  Facial asymmetry: chest pain.     Objective    BP 133/89 (BP Location: Right Arm, Patient Position: Sitting, Cuff Size: Normal)   Pulse 74   Temp 98 F (36.7 C) (Oral)   Ht 5\' 4"  (1.626 m)   Wt 135 lb 1.6 oz (61.3 kg)   SpO2 97%   BMI 23.19 kg/m   Physical Exam Vitals reviewed.  Constitutional:      Appearance: She is not ill-appearing.  HENT:     Head: Normocephalic.  Eyes:     Conjunctiva/sclera: Conjunctivae normal.  Cardiovascular:     Rate and Rhythm: Normal rate.  Pulmonary:     Effort: Pulmonary effort is normal. No respiratory distress.  Neurological:     General: No focal deficit present.     Mental Status: She is alert and oriented to person, place, and time.  Psychiatric:        Mood and Affect: Mood normal.        Behavior: Behavior normal.      No results found for any visits on 02/27/23.  Assessment & Plan     Problem List Items Addressed This Visit       Other   Hyperlipidemia - Primary    Unmedicated, risk is low; The 10-year ASCVD risk score (Arnett DK, et al., 2019) is: 5.2% Advised if LDL is still elevated > 150 would consider low dose statin for cardiovascular protection       Relevant Orders   Lipid panel   Comprehensive Metabolic Panel (CMET)   Social anxiety disorder    Improved on buspar Pt still has difficulty with social situations Would benefit from therapy Brief counseling given today        Return in about 9 months (around 11/29/2023) for CPE.      I, [name], PA-C have reviewed all documentation for this visit. The documentation on  3/29/24for the exam, diagnosis, procedures, and orders are all accurate and complete.  [name],  PA-C [facility] [address] [place], [place], [address] Office: [phone] Fax: [place]

## 2023-02-27 NOTE — Assessment & Plan Note (Signed)
Improved on buspar Pt still has difficulty with social situations Would benefit from therapy Brief counseling given today

## 2023-02-28 LAB — COMPREHENSIVE METABOLIC PANEL
ALT: 69 IU/L — ABNORMAL HIGH (ref 0–32)
AST: 61 IU/L — ABNORMAL HIGH (ref 0–40)
Albumin/Globulin Ratio: 1.8 (ref 1.2–2.2)
Albumin: 4.6 g/dL (ref 3.9–4.9)
Alkaline Phosphatase: 61 IU/L (ref 44–121)
BUN/Creatinine Ratio: 14 (ref 12–28)
BUN: 10 mg/dL (ref 8–27)
Bilirubin Total: 0.3 mg/dL (ref 0.0–1.2)
CO2: 20 mmol/L (ref 20–29)
Calcium: 9.7 mg/dL (ref 8.7–10.3)
Chloride: 103 mmol/L (ref 96–106)
Creatinine, Ser: 0.71 mg/dL (ref 0.57–1.00)
Globulin, Total: 2.5 g/dL (ref 1.5–4.5)
Glucose: 88 mg/dL (ref 70–99)
Potassium: 4.3 mmol/L (ref 3.5–5.2)
Sodium: 142 mmol/L (ref 134–144)
Total Protein: 7.1 g/dL (ref 6.0–8.5)
eGFR: 95 mL/min/{1.73_m2} (ref >59)

## 2023-02-28 LAB — LIPID PANEL
Chol/HDL Ratio: 3.4 ratio (ref 0.0–4.4)
Cholesterol, Total: 278 mg/dL — ABNORMAL HIGH (ref 100–199)
HDL: 82 mg/dL (ref >39)
LDL Chol Calc (NIH): 155 mg/dL — ABNORMAL HIGH (ref 0–99)
Triglycerides: 228 mg/dL — ABNORMAL HIGH (ref 0–149)
VLDL Cholesterol Cal: 41 mg/dL — ABNORMAL HIGH (ref 5–40)

## 2023-02-28 IMAGING — MG MM BREAST SURGICAL SPECIMEN
1 series · 1 of 1 positions shown · non-contrast
Comparison: Previous exams.

CLINICAL DATA: 60-year-old with biopsy proven invasive mammary
carcinoma with lobular features and lobular neoplasia involving the
inner LEFT breast.

EXAM:
SPECIMEN RADIOGRAPH OF THE LEFT BREAST

[L]
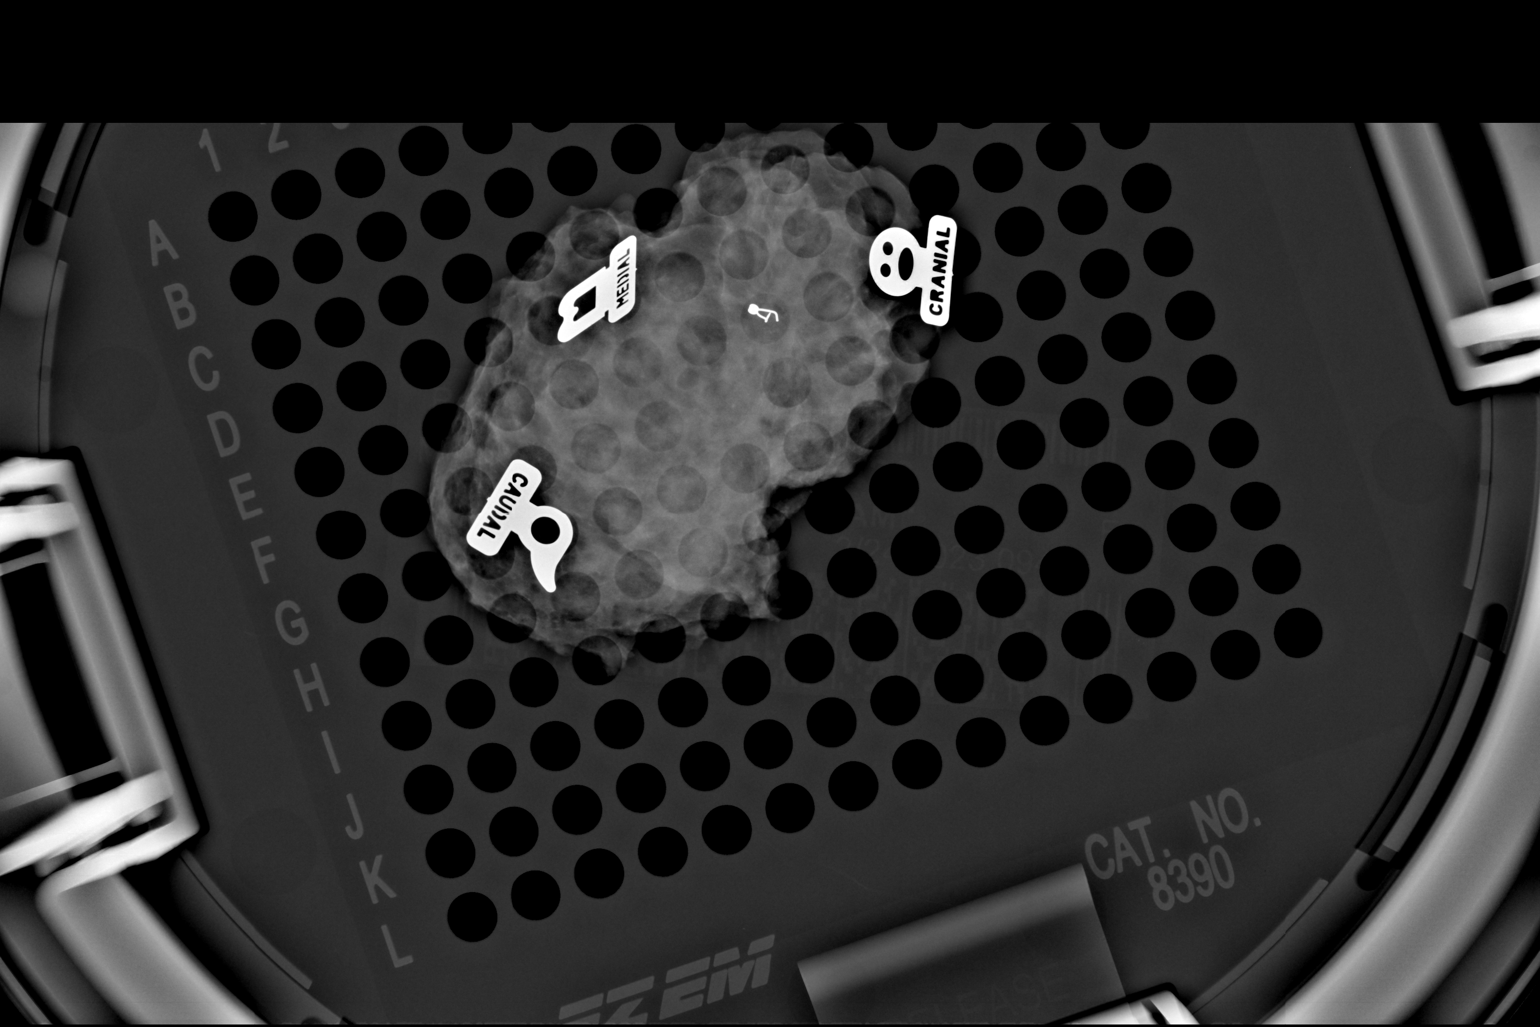

[1 of 1 positions shown; findings below may reference images not displayed]

FINDINGS: Status post excision of the LEFT breast. The specimen radiograph is
submitted for interpretation post-operatively. The specimen contains
the Jumper tissue marking clip.
IMPRESSION: Specimen radiograph of the LEFT breast.

## 2023-03-06 ENCOUNTER — Ambulatory Visit: Payer: BC Managed Care – PPO | Admitting: Internal Medicine

## 2023-03-06 ENCOUNTER — Other Ambulatory Visit: Payer: BC Managed Care – PPO

## 2023-03-09 ENCOUNTER — Encounter: Payer: Self-pay | Admitting: Internal Medicine

## 2023-03-09 ENCOUNTER — Inpatient Hospital Stay: Payer: BC Managed Care – PPO | Attending: Oncology

## 2023-03-09 ENCOUNTER — Inpatient Hospital Stay: Payer: BC Managed Care – PPO | Admitting: Internal Medicine

## 2023-03-09 VITALS — BP 120/80 | HR 79 | Resp 18 | Wt 133.0 lb

## 2023-03-09 DIAGNOSIS — C50812 Malignant neoplasm of overlapping sites of left female breast: Secondary | ICD-10-CM | POA: Insufficient documentation

## 2023-03-09 DIAGNOSIS — Z923 Personal history of irradiation: Secondary | ICD-10-CM | POA: Insufficient documentation

## 2023-03-09 DIAGNOSIS — Z79811 Long term (current) use of aromatase inhibitors: Secondary | ICD-10-CM | POA: Insufficient documentation

## 2023-03-09 DIAGNOSIS — Z800 Family history of malignant neoplasm of digestive organs: Secondary | ICD-10-CM | POA: Diagnosis not present

## 2023-03-09 DIAGNOSIS — Z170 Estrogen receptor positive status [ER+]: Secondary | ICD-10-CM | POA: Insufficient documentation

## 2023-03-09 DIAGNOSIS — E785 Hyperlipidemia, unspecified: Secondary | ICD-10-CM | POA: Insufficient documentation

## 2023-03-09 DIAGNOSIS — M85852 Other specified disorders of bone density and structure, left thigh: Secondary | ICD-10-CM | POA: Insufficient documentation

## 2023-03-09 DIAGNOSIS — Z803 Family history of malignant neoplasm of breast: Secondary | ICD-10-CM | POA: Insufficient documentation

## 2023-03-09 DIAGNOSIS — Z79899 Other long term (current) drug therapy: Secondary | ICD-10-CM | POA: Insufficient documentation

## 2023-03-09 LAB — CBC WITH DIFFERENTIAL/PLATELET
Abs Immature Granulocytes: 0.01 10*3/uL (ref 0.00–0.07)
Basophils Absolute: 0.1 10*3/uL (ref 0.0–0.1)
Basophils Relative: 1 %
Eosinophils Absolute: 0.1 10*3/uL (ref 0.0–0.5)
Eosinophils Relative: 3 %
HCT: 40.2 % (ref 36.0–46.0)
Hemoglobin: 13.8 g/dL (ref 12.0–15.0)
Immature Granulocytes: 0 %
Lymphocytes Relative: 34 %
Lymphs Abs: 1.4 10*3/uL (ref 0.7–4.0)
MCH: 34.4 pg — ABNORMAL HIGH (ref 26.0–34.0)
MCHC: 34.3 g/dL (ref 30.0–36.0)
MCV: 100.2 fL — ABNORMAL HIGH (ref 80.0–100.0)
Monocytes Absolute: 0.5 10*3/uL (ref 0.1–1.0)
Monocytes Relative: 12 %
Neutro Abs: 2.1 10*3/uL (ref 1.7–7.7)
Neutrophils Relative %: 50 %
Platelets: 316 10*3/uL (ref 150–400)
RBC: 4.01 MIL/uL (ref 3.87–5.11)
RDW: 12.4 % (ref 11.5–15.5)
WBC: 4.1 10*3/uL (ref 4.0–10.5)
nRBC: 0 % (ref 0.0–0.2)

## 2023-03-09 LAB — COMPREHENSIVE METABOLIC PANEL
ALT: 55 U/L — ABNORMAL HIGH (ref 0–44)
AST: 38 U/L (ref 15–41)
Albumin: 4.5 g/dL (ref 3.5–5.0)
Alkaline Phosphatase: 51 U/L (ref 38–126)
Anion gap: 8 (ref 5–15)
BUN: 15 mg/dL (ref 8–23)
CO2: 25 mmol/L (ref 22–32)
Calcium: 9.1 mg/dL (ref 8.9–10.3)
Chloride: 105 mmol/L (ref 98–111)
Creatinine, Ser: 0.87 mg/dL (ref 0.44–1.00)
GFR, Estimated: >60 mL/min (ref >60)
Glucose, Bld: 108 mg/dL — ABNORMAL HIGH (ref 70–99)
Potassium: 4.3 mmol/L (ref 3.5–5.1)
Sodium: 138 mmol/L (ref 135–145)
Total Bilirubin: 0.7 mg/dL (ref 0.3–1.2)
Total Protein: 7.8 g/dL (ref 6.5–8.1)

## 2023-03-09 MED ORDER — EXEMESTANE 25 MG PO TABS: 25.0000 mg | ORAL_TABLET | Freq: Every day | ORAL | 1 refills | Status: DC

## 2023-03-09 NOTE — Progress Notes (Signed)
Patient is having trigger fingers issues and "really bad back pain" she has been taking ibuprofen and using tens unit. Was wondering if this is a side effect of letrozole.

## 2023-03-09 NOTE — Patient Instructions (Signed)
#   Stop taking letrozole.  # Recommend starting examestane 1 pill a day; start in approximately 1 month from now.  Check with insurance regarding cost of the prescription.  Call us if significantly expensive; we can try [facility].

## 2023-03-09 NOTE — Progress Notes (Signed)
[facility] CONSULT NOTE  Patient Care Team: Alfredia Fergusonrubel, Lindsay, PA-C as PCP - General (Physician Assistant) Scarlett PrestoShaver, Anne F, RN (Inactive) as Oncology Nurse Navigator Earna CoderBrahmanday, Keidy Thurgood R, MD as Consulting Physician (Oncology) Lemar LivingsByrnett, Merrily PewJeffrey W, MD as Consulting Physician (General Surgery) Carmina Millerhrystal, Glenn, MD as Consulting Physician (Radiation Oncology)  CHIEF COMPLAINTS/PURPOSE OF CONSULTATION: Breast cancer    Oncology History Overview Note  #Invasive lobular breast cancere ER;PR Positive; her 2 Negative; stage IA. mT [6011mm]; pN0; s/p lumpectomy; Oncotype -;   # IMPRESSION: 1. A 6 mm mass in the LEFT inner breast at 9 o'clock 1 cm from the nipple with associated architectural distortion is indeterminate. Recommend ultrasound-guided biopsy for definitive characterization with attention on post marker placement mammogram to assess for mammographic/sonographic correlation. 2. No suspicious LEFT axillary adenopathy.  DIAGNOSIS:  A.  BREAST, LEFT 9:00 1 CM FN; ULTRASOUND-GUIDED BIOPSY:  - INVASIVE MAMMARY CARCINOMA WITH LOBULAR FEATURES.  - LOBULAR NEOPLASIA.   Size of invasive carcinoma: 8 mm in this sample  Histologic grade of invasive carcinoma: Grade 2                       Glandular/tubular differentiation score: 3                       Nuclear pleomorphism score: 2                       Mitotic rate score: 1                       Total score: 6  Ductal carcinoma in situ: Not identified  Lymphovascular invasion: Not identified   ER/PR/HER2: Immunohistochemistry will be performed on block A1, with  reflex to FISH for HER2 2+. The results will be reported in an addendum.   Comment:  The definitive grade will be assigned on the excisional specimen.   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  Oncotype low risk. MAY 12th, 2023- Anastrazole.[Stopped sec to MSK]  # MID JUNE 2023- LETROZOLE [sec  to MSK stopped April 2024]; MAY 2024- start examestane   Carcinoma of overlapping sites of left breast in female, estrogen receptor positive  02/07/2022 Initial Diagnosis   Carcinoma of overlapping sites of left breast in female, estrogen receptor positive (HCC)   02/07/2022 Cancer Staging   Staging form: Breast, AJCC 8th Edition - Pathologic: Stage IA (pT1c, pN0, cM0, G2, ER+, PR+, HER2-) - Signed by Earna CoderBrahmanday, Baneza Bartoszek R, MD on 02/07/2022 Histologic grading system: 3 grade system    Genetic Testing   Negative genetic testing. No pathogenic variants identified on the [facility] CancerNext-Expanded+RNA panel. The report date is 02/21/2022.  The CancerNext-Expanded + RNAinsight gene panel offered by [facility] [facility] and includes sequencing and rearrangement analysis for the following 77 genes: IP, ALK, APC*, ATM*, AXIN2, BAP1, BARD1, BLM, BMPR1A, BRCA1*, BRCA2*, BRIP1*, CDC73, CDH1*,CDK4, CDKN1B, CDKN2A, CHEK2*, CTNNA1, DICER1, FANCC, FH, FLCN, GALNT12, KIF1B, LZTR1, MAX, MEN1, MET, MLH1*, MSH2*, MSH3, MSH6*, MUTYH*, NBN, NF1*, NF2, NTHL1, PALB2*, PHOX2B, PMS2*, POT1, PRKAR1A, PTCH1, PTEN*, RAD51C*, RAD51D*,RB1, RECQL, RET, SDHA, SDHAF2, SDHB, SDHC, SDHD, SMAD4, SMARCA4, SMARCB1, SMARCE1, STK11, SUFU, TMEM127, TP53*,TSC1, TSC2, VHL and XRCC2 (sequencing and deletion/duplication); EGFR, EGLN1, HOXB13, KIT, MITF, PDGFRA, POLD1 and POLE (sequencing only); EPCAM and GREM1 (deletion/duplication only).    HISTORY OF PRESENTING ILLNESS: Patient ambulating independently.  Alone.  Tricia ReaderPamela Jo [facility]  62 y.o.  female with a history of stage I ER/PR breast cancer currently on adjuvant AI is here for follow-up/proceed with reclast.  patient is having trigger fingers issues and "really bad back pain" she has been taking ibuprofen and using tens unit.  Noted to have bilateral trigger fingers.  Was wondering if this is a side effect of letrozole.   Otherwise, patient is doing well on the Letrozole.  Patient denies any  dental pain.  Otherwise no worsening joint pains or back pain.   Review of Systems  Constitutional:  Negative for chills, diaphoresis, fever, malaise/fatigue and weight loss.  HENT:  Negative for nosebleeds and sore throat.   Eyes:  Negative for double vision.  Respiratory:  Negative for cough, hemoptysis, sputum production, shortness of breath and wheezing.   Cardiovascular:  Negative for chest pain, palpitations, orthopnea and leg swelling.  Gastrointestinal:  Negative for abdominal pain, blood in stool, constipation, diarrhea, heartburn, melena, nausea and vomiting.  Genitourinary:  Negative for dysuria, frequency and urgency.  Musculoskeletal:  Positive for back pain and joint pain.  Skin: Negative.  Negative for itching and rash.  Neurological:  Negative for dizziness, tingling, focal weakness, weakness and headaches.  Endo/Heme/Allergies:  Does not bruise/bleed easily.  Psychiatric/Behavioral:  Negative for depression. The patient is not nervous/anxious and does not have insomnia.      MEDICAL HISTORY:  Past Medical History:  Diagnosis Date   ADD (attention deficit disorder)    Anxiety    Breast cancer 01/2022   Depression    Family history of breast cancer    Family history of liver cancer    Family history of pancreatic cancer    Hyperlipidemia    Malignant neoplasm of left breast    Personal history of radiation therapy    Psoriasis    Vitamin D deficiency     SURGICAL HISTORY: Past Surgical History:  Procedure Laterality Date   BREAST BIOPSY Left 01/08/2022   [place] bs, venus marker, invasive mammary   BREAST LUMPECTOMY Left 01/24/2022   BREAST LUMPECTOMY WITH SENTINEL LYMPH NODE BIOPSY Left 01/24/2022   Procedure: BREAST LUMPECTOMY WITH SENTINEL AXIILLARY LYMPH NODE BX;  Surgeon: Earline Mayotte, MD;  Location: [facility] ORS;  Service: General;  Laterality: Left;   BUNIONECTOMY  12/01/2008   BUNIONECTOMY N/A    2015   COLONOSCOPY     COLONOSCOPY WITH PROPOFOL N/A  07/16/2022   Procedure: COLONOSCOPY WITH PROPOFOL;  Surgeon: Earline Mayotte, MD;  Location: [facility] ENDOSCOPY;  Service: Endoscopy;  Laterality: N/A;    SOCIAL HISTORY: Social History   Socioeconomic History   Marital status: Married    Spouse name: Not on file   Number of children: Not on file   Years of education: Not on file   Highest education level: Bachelor's degree (e.g., BA, AB, BS)  Occupational History   Not on file  Tobacco Use   Smoking status: Never   Smokeless tobacco: Never  Vaping Use   Vaping Use: Never used  Substance and Sexual Activity   Alcohol use: Not Currently    Alcohol/week: 3.0 standard drinks of alcohol    Types: 3 Glasses of wine per week   Drug use: No   Sexual activity: Not Currently  Other Topics Concern   Not on file  Social History Narrative   Lives in [place]; with husband; son' grandson. Daughter- in [place]. 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    Social Determinants of Corporate investment banker  Strain: Low Risk  (02/23/2023)   Overall Financial Resource Strain (CARDIA)    Difficulty of Paying Living Expenses: Not hard at all  Food Insecurity: No Food Insecurity (02/23/2023)   Hunger Vital Sign    Worried About Running Out of Food in the Last Year: Never true    Ran Out of Food in the Last Year: Never true  Transportation Needs: No Transportation Needs (02/23/2023)   PRAPARE - Administrator, Civil Service (Medical): No    Lack of Transportation (Non-Medical): No  Physical Activity: Sufficiently Active (02/23/2023)   Exercise Vital Sign    Days of Exercise per Week: 3 days    Minutes of Exercise per Session: 50 min  Stress: No Stress Concern Present (02/23/2023)   [employer] of Occupational Health - Occupational Stress Questionnaire    Feeling of Stress : Only a little  Social Connections: Moderately Integrated (02/23/2023)   Social Connection and Isolation Panel [NHANES]    Frequency of  Communication with Friends and Family: More than three times a week    Frequency of Social Gatherings with Friends and Family: Once a week    Attends Religious Services: More than 4 times per year    Active Member of [employer] or Organizations: No    Attends Engineer, structural: Not on file    Marital Status: Married  Catering manager Violence: Not on file    FAMILY HISTORY: Family History  Problem Relation Age of Onset   Pancreatic cancer Mother    Liver cancer Father    Breast cancer Maternal Aunt    Liver cancer Maternal Grandfather     ALLERGIES:  has No Known Allergies.  MEDICATIONS:  Current Outpatient Medications  Medication Sig Dispense Refill   acetaminophen (TYLENOL) 325 MG tablet Take 650 mg by mouth every 4 (four) hours as needed for mild pain.     busPIRone (BUSPAR) 10 MG tablet Take 1 tablet (10 mg total) by mouth in the morning. Take with 5 mg in evening 90 tablet 1   busPIRone (BUSPAR) 5 MG tablet Take 1 tablet (5 mg total) by mouth at bedtime. 90 tablet 1   clobetasol ointment (TEMOVATE) 0.05 % Apply 1 Application topically 2 (two) times daily.     exemestane (AROMASIN) 25 MG tablet Take 1 tablet (25 mg total) by mouth daily after breakfast. 90 tablet 1   Ibuprofen 200 MG CAPS Take by mouth.     levothyroxine (SYNTHROID) 50 MCG tablet TAKE 1 TABLET BY MOUTH EVERY DAY 90 tablet 3   triamcinolone cream (KENALOG) 0.1 % APPLY TWICE A DAY TO AFFECTED AREAS (Patient taking differently: Apply 1 application  topically 2 (two) times daily as needed. APPLY TWICE A DAY TO AFFECTED AREAS) 30 g 6   Vitamin D, Ergocalciferol, (DRISDOL) 1.25 MG (50000 UNIT) CAPS capsule TAKE 1 CAPSULE (50,000 UNITS TOTAL) BY MOUTH EVERY 7 (SEVEN) DAYS 12 capsule 1   ZORYVE 0.3 % CREA Apply 1 application  topically daily as needed (Psoriasis).     No current facility-administered medications for this visit.      Marland Kitchen  PHYSICAL EXAMINATION: ECOG PERFORMANCE STATUS: 0 -  Asymptomatic  Vitals:   03/09/23 1014  BP: 120/80  Pulse: 79  Resp: 18  SpO2: 98%   Filed Weights   03/09/23 1009  Weight: 133 lb (60.3 kg)    Physical Exam Vitals and nursing note reviewed.  HENT:     Head: Normocephalic and atraumatic.  Mouth/Throat:     Pharynx: Oropharynx is clear.  Eyes:     Extraocular Movements: Extraocular movements intact.     Pupils: Pupils are equal, round, and reactive to light.  Cardiovascular:     Rate and Rhythm: Normal rate and regular rhythm.  Pulmonary:     Comments: Decreased breath sounds bilaterally.  Abdominal:     Palpations: Abdomen is soft.  Musculoskeletal:        General: Normal range of motion.     Cervical back: Normal range of motion.  Skin:    General: Skin is warm.  Neurological:     General: No focal deficit present.     Mental Status: She is alert and oriented to person, place, and time.  Psychiatric:        Behavior: Behavior normal.        Judgment: Judgment normal.     LABORATORY DATA:  I have reviewed the data as listed Lab Results  Component Value Date   WBC 4.1 03/09/2023   HGB 13.8 03/09/2023   HCT 40.2 03/09/2023   MCV 100.2 (H) 03/09/2023   PLT 316 03/09/2023   Recent Labs    09/05/22 0906 11/21/22 0905 02/27/23 0836 03/09/23 0955  NA 139 139 142 138  K 3.9 4.6 4.3 4.3  CL 104 101 103 105  CO2 26 22 20 25   GLUCOSE 116* 100* 88 108*  BUN 16 16 10 15   CREATININE 0.75 0.85 0.71 0.87  CALCIUM 9.6 9.5 9.7 9.1  GFRNONAA >60  --   --  >60  PROT 7.7 6.7 7.1 7.8  ALBUMIN 4.2 4.5 4.6 4.5  AST 48* 30 61* 38  ALT 43 34* 69* 55*  ALKPHOS 69 65 61 51  BILITOT 0.5 0.3 0.3 0.7    RADIOGRAPHIC STUDIES: I have personally reviewed the radiological images as listed and agreed with the findings in the report. No results found.  ASSESSMENT & PLAN:   Carcinoma of overlapping sites of left breast in female, estrogen receptor positive (HCC) # MARCH 2023- invasive lobular breast cancer ER;PR  Positive; her 2 Negative; stage IA; low risk Oncotype.  No benefit from chemotherapy.  Patient s/p radiation. currently on letrozole [intol to anastrozole; since mid June 2023] [Dr.Byrnet/Dr.Sakai- JAN 2024- Bil Mammo- WNL. Tolerating letrozole poorly [MSK- see below]. Recommend examestane  # MSK- G-2 [trigger fingers; back pain]- sec to letrozole. See above.   #Osteopenia: [June 2023]- The BMD measured at Femur Neck Left  T-score of -2.1.  I discussed at length the potential risk factors for osteoporosis- age/gender/postmenopausal status/use of anti-estrogen treatments.proceed with Reclast.  Discussed the potential benefits and/side effects  Including but not limited to Osteonecrosis of jaw/ hypocalcemia. Also [facility]  # Slightly elevated- AST- ?  Related to fatty liver versus others.  Recommend decreasing Tylenol/ibuprofen [2-4 a day for many years]. Discussed re: lean meat/veggies; nuts.  Discussed re: proceed food.  # DISPOSITION:  # follow up in 66months MD: labs- cbc/cmp; Vit D 25 OH levels; reclast - Dr.B  All questions were answered. The patient/family knows to call the clinic with any problems, questions or concerns.    Earna Coder, MD 03/09/2023 10:46 AM

## 2023-03-09 NOTE — Assessment & Plan Note (Addendum)
#   MARCH 2023- invasive lobular breast cancer ER;PR Positive; her 2 Negative; stage IA; low risk Oncotype.  No benefit from chemotherapy.  Patient s/p radiation. currently on letrozole [intol to anastrozole; since mid June 2023] [Dr.Byrnet/Dr.Sakai- JAN 2024- Bil Mammo- WNL. Tolerating letrozole poorly [MSK- see below]. Recommend examestane  # MSK- G-2 [trigger fingers; back pain]- sec to letrozole. See above.   #Osteopenia: [June 2023]- The BMD measured at Femur Neck Left  T-score of -2.1.  I discussed at length the potential risk factors for osteoporosis- age/gender/postmenopausal status/use of anti-estrogen treatments.proceed with Reclast.  Discussed the potential benefits and/side effects  Including but not limited to Osteonecrosis of jaw/ hypocalcemia. Also encouraged [facility].  # Slightly elevated- AST- ?  Related to fatty liver versus others.  Recommend decreasing Tylenol/ibuprofen [2-4 a day for many years]. Discussed re: lean meat/veggies; nuts.  Discussed re: proceed food.  # DISPOSITION:  # follow up in 28months MD: labs- cbc/cmp; Vit D 25 OH levels; reclast - Dr.B

## 2023-04-15 ENCOUNTER — Ambulatory Visit: Payer: BC Managed Care – PPO | Admitting: Radiation Oncology

## 2023-04-29 ENCOUNTER — Telehealth: Payer: Self-pay | Admitting: *Deleted

## 2023-04-29 NOTE — Telephone Encounter (Addendum)
Patient called reporting that she is having problems with the new AI nad her hands she is having trigger finger in both hands now and is asking what to do re the AI. Please advise

## 2023-05-04 ENCOUNTER — Encounter: Payer: Self-pay | Admitting: Internal Medicine

## 2023-05-04 ENCOUNTER — Telehealth: Payer: Self-pay | Admitting: Internal Medicine

## 2023-05-04 NOTE — Progress Notes (Signed)
After lengthy discussion patient decided to go back on anastrozole.  New prescription sent. Dis continue Aromasin.

## 2023-05-04 NOTE — Telephone Encounter (Signed)
I spoke to pt re: MSK re: on aromasin.   ? Dilated capillaries with Anastrazole- discussed re: possibly going back on anastrazole. Pt disconnected/different phone call.  Call us back.  Recommend discontinuation of Aromasin and starting back on anastrozole.  GB

## 2023-05-05 ENCOUNTER — Encounter: Payer: Self-pay | Admitting: Internal Medicine

## 2023-05-05 ENCOUNTER — Telehealth: Payer: Self-pay | Admitting: *Deleted

## 2023-05-05 MED ORDER — ANASTROZOLE 1 MG PO TABS: 1.0000 mg | ORAL_TABLET | Freq: Every day | ORAL | 1 refills | Status: DC

## 2023-05-05 NOTE — Telephone Encounter (Signed)
Pt having issues with trigger fingers now on both hands. Discussed joint pains as a side effect. Trigger fingers may be an orthopedic issue whereby she needs assesment and treatment by Ortho. Pt is willing to stop exemestane and try anastrazole. Prescription sent to pharmacy.

## 2023-06-30 ENCOUNTER — Other Ambulatory Visit: Payer: Self-pay | Admitting: Physician Assistant

## 2023-06-30 DIAGNOSIS — F4010 Social phobia, unspecified: Secondary | ICD-10-CM

## 2023-06-30 NOTE — Telephone Encounter (Addendum)
Medication Refill - Medication:  levothyroxine (SYNTHROID) 50 MCG tablet  busPIRone (BUSPAR) 10 MG tablet    - needs all medications Tricia Rivera has prescribed -   Has the patient contacted their pharmacy? Yes.  Advised to contact PCP office. Pharmacy said they could not refill since she is still under Tricia Rivera and patient's follow up is not until December 2024, per patient she needs a refill before then.  Preferred Pharmacy (with phone number or street name):  [facility] [phone] [place], [place] [name] Phone: [phone]  Fax: [phone]       Has the patient been seen for an appointment in the last year OR does the patient have an upcoming appointment? YES. CPE with 12.26.2024 with Dr Jacquenette Shone

## 2023-07-01 MED ORDER — LEVOTHYROXINE SODIUM 50 MCG PO TABS: 50.0000 ug | ORAL_TABLET | Freq: Every day | ORAL | 1 refills | Status: DC

## 2023-07-01 MED ORDER — BUSPIRONE HCL 5 MG PO TABS
5.0000 mg | ORAL_TABLET | Freq: Every day | ORAL | 1 refills | Status: DC
Start: 2023-07-01 — End: 2023-08-11

## 2023-07-01 NOTE — Telephone Encounter (Signed)
Patient request refills until OV with Jacquenette Shone, MD.   Requested Prescriptions  Pending Prescriptions Disp Refills   busPIRone (BUSPAR) 5 MG tablet 90 tablet 1    Sig: Take 1 tablet (5 mg total) by mouth at bedtime.     Psychiatry: Anxiolytics/Hypnotics - Non-controlled Passed - 06/30/2023  4:17 PM      Passed - Valid encounter within last 12 months    Recent Outpatient Visits           4 months ago Hyperlipidemia, unspecified hyperlipidemia type   [facility] Alfredia Ferguson, PA-C   6 months ago Social anxiety disorder   [facility] Ok Edwards, [place], PA-C   7 months ago Encounter for annual physical exam   [facility] Ok Edwards, [place], PA-C   8 months ago Social anxiety disorder   [facility] Alfredia Ferguson, PA-C   1 year ago Rash   [facility] Alfredia Ferguson, PA-C       Future Appointments             In 4 months Pardue, Monico Blitz, DO [facility]             levothyroxine (SYNTHROID) 50 MCG tablet 90 tablet 1    Sig: Take 1 tablet (50 mcg total) by mouth daily.     Endocrinology:  Hypothyroid Agents Failed - 06/30/2023  4:17 PM      Failed - TSH in normal range and within 360 days    TSH  Date Value Ref Range Status  05/15/2022 3.050 0.450 - 4.500 uIU/mL Final         Passed - Valid encounter within last 12 months    Recent Outpatient Visits           4 months ago Hyperlipidemia, unspecified hyperlipidemia type   [facility]ia Ferguson, PA-C   6 months ago Social anxiety disorder   [facility] Ok Edwards, [place], PA-C   7 months ago Encounter for annual physical exam   [facility] Ok Edwards, [place], PA-C   8 months ago Social anxiety disorder   [facility] Ok Edwards, [place], PA-C   1 year  ago Rash   [facility] Alfredia Ferguson, PA-C       Future Appointments             In 4 months Pardue, Monico Blitz, DO  [facility]

## 2023-08-02 ENCOUNTER — Other Ambulatory Visit: Payer: Self-pay | Admitting: Physician Assistant

## 2023-08-02 DIAGNOSIS — F4010 Social phobia, unspecified: Secondary | ICD-10-CM

## 2023-08-02 DIAGNOSIS — E559 Vitamin D deficiency, unspecified: Secondary | ICD-10-CM

## 2023-08-07 ENCOUNTER — Telehealth: Payer: Self-pay | Admitting: Physician Assistant

## 2023-08-07 ENCOUNTER — Other Ambulatory Visit: Payer: Self-pay | Admitting: Physician Assistant

## 2023-08-07 DIAGNOSIS — F4010 Social phobia, unspecified: Secondary | ICD-10-CM

## 2023-08-07 DIAGNOSIS — E559 Vitamin D deficiency, unspecified: Secondary | ICD-10-CM

## 2023-08-07 NOTE — Telephone Encounter (Signed)
Patient called to verify which dosage of Buspar she needs, she says the 10 mg. She also says she needs to see someone to have liver enzymes blood work done. Scheduled for Tuesday with Dr. Beryle Flock.

## 2023-08-07 NOTE — Telephone Encounter (Signed)
Patient called requesting that provider send in labs for her to have her liver enzymes checked. Please f/u with patient

## 2023-08-07 NOTE — Telephone Encounter (Signed)
Medication Refill - Medication: busPIRone (BUSPAR) 5 MG tablet and Vitamin D, Ergocalciferol, (DRISDOL) 1.25 MG (50000 UNIT) CAPS capsule   Has the patient contacted their pharmacy? No.   Preferred Pharmacy (with phone number or street name):  [id number] - [place], [place] [name] Phone: [phone]  Fax: [phone]     Has the patient been seen for an appointment in the last year OR does the patient have an upcoming appointment? Yes.    Agent: Please be advised that RX refills may take up to 3 business days. We ask that you follow-up with your pharmacy.

## 2023-08-10 NOTE — Telephone Encounter (Signed)
Requested medications are due for refill today.  yes  Requested medications are on the active medications list.  yes  Last refill. Buspirone  12/30/2022 #90 1 rf, Vitamin D 11/272023 #12 3 rf  Future visit scheduled.   yes  Notes to clinic.  Alfredia Ferguson listed as PCP and signed rx's. Expired labs. Provider to review refill for vitamin D in this dosage.    Requested Prescriptions  Pending Prescriptions Disp Refills   busPIRone (BUSPAR) 10 MG tablet 90 tablet 1    Sig: Take 1 tablet (10 mg total) by mouth in the morning. Take with 5 mg in evening     Psychiatry: Anxiolytics/Hypnotics - Non-controlled Passed - 08/07/2023  5:20 PM      Passed - Valid encounter within last 12 months    Recent Outpatient Visits           5 months ago Hyperlipidemia, unspecified hyperlipidemia type   [facility] Alfredia Ferguson, PA-C   7 months ago Social anxiety disorder   [facility] Ok Edwards, [place], PA-C   8 months ago Encounter for annual physical exam   [facility] Ok Edwards, Lillia Abed, PA-C   9 months ago Social anxiety disorder   [place] [facility] Alfredia Ferguson, PA-C   1 year ago Rash   [facility] Alfredia Ferguson, [place]       Future Appointments             Tomorrow Bacigalupo, Marzella Schlein, MD [facility]   In 3 months Pardue, Monico Blitz, DO [facility], [facility]             Vitamin D, Ergocalciferol, (DRISDOL) 1.25 MG (50000 UNIT) CAPS capsule 12 capsule 1    Sig: Take 1 capsule (50,000 Units total) by mouth every 7 (seven) days.     Endocrinology:  Vitamins - Vitamin D Supplementation 2 Failed - 08/07/2023  5:20 PM      Failed - Manual Review: Route requests for 50,000 IU strength to the provider      Passed - Ca in normal range and within 360 days    Calcium  Date Value Ref Range Status  03/09/2023 9.1  8.9 - 10.3 mg/dL Final         Passed - Vitamin D in normal range and within 360 days    Vit D, 25-Hydroxy  Date Value Ref Range Status  09/05/2022 95.39 30 - 100 ng/mL Final    Comment:    (NOTE) Vitamin D deficiency has been defined by the Institute of Medicine  and an Endocrine Society practice guideline as a level of serum 25-OH  vitamin D less than 20 ng/mL (1,2). The Endocrine Society went on to  further define vitamin D insufficiency as a level between 21 and 29  ng/mL (2).  1. IOM (Institute of Medicine). 2010. Dietary reference intakes for  calcium and D. [place]: [employer]. 2. Holick MF, Binkley [place], Bischoff-Ferrari HA, et al. Evaluation,  treatment, and prevention of vitamin D deficiency: an Endocrine  Society clinical practice guideline, JCEM. 2011 Jul; 96(7): 1911-30.  Performed at [facility], [address] [place], [place] [address]          Passed - Valid encounter within last 12 months    Recent Outpatient Visits           5 months ago Hyperlipidemia,  unspecified hyperlipidemia type   [facility] Alfredia Ferguson, PA-C   7 months ago Social anxiety disorder   [facility] Ok Edwards, [place], [place]   8 months ago Encounter for annual physical exam   [facility] Edwards, [place], [place]   9 months ago Social anxiety disorder   [facility] Ok Edwards, [place], PA-C   1 year ago Rash   [facility] Alfredia Ferguson, [place]       Future Appointments             Tomorrow Bacigalupo, Marzella Schlein, MD [facility]   In 3 months Pardue, Monico Blitz, DO [facility], [facility]

## 2023-08-11 ENCOUNTER — Encounter: Payer: Self-pay | Admitting: Family Medicine

## 2023-08-11 ENCOUNTER — Telehealth (INDEPENDENT_AMBULATORY_CARE_PROVIDER_SITE_OTHER): Payer: BC Managed Care – PPO | Admitting: Family Medicine

## 2023-08-11 DIAGNOSIS — E034 Atrophy of thyroid (acquired): Secondary | ICD-10-CM

## 2023-08-11 DIAGNOSIS — E782 Mixed hyperlipidemia: Secondary | ICD-10-CM

## 2023-08-11 DIAGNOSIS — R5382 Chronic fatigue, unspecified: Secondary | ICD-10-CM | POA: Diagnosis not present

## 2023-08-11 DIAGNOSIS — F4010 Social phobia, unspecified: Secondary | ICD-10-CM

## 2023-08-11 DIAGNOSIS — E559 Vitamin D deficiency, unspecified: Secondary | ICD-10-CM | POA: Diagnosis not present

## 2023-08-11 DIAGNOSIS — R7303 Prediabetes: Secondary | ICD-10-CM

## 2023-08-11 MED ORDER — VITAMIN D (ERGOCALCIFEROL) 1.25 MG (50000 UNIT) PO CAPS
50000.0000 [IU] | ORAL_CAPSULE | ORAL | 1 refills | Status: DC
Start: 2023-08-11 — End: 2024-02-05

## 2023-08-11 MED ORDER — BUSPIRONE HCL 10 MG PO TABS
10.0000 mg | ORAL_TABLET | Freq: Every morning | ORAL | 1 refills | Status: DC
Start: 2023-08-11 — End: 2023-12-03

## 2023-08-11 MED ORDER — BUSPIRONE HCL 5 MG PO TABS
5.0000 mg | ORAL_TABLET | Freq: Every day | ORAL | 1 refills | Status: DC
Start: 2023-08-11 — End: 2023-12-03

## 2023-08-11 NOTE — Progress Notes (Signed)
MyChart Video Visit    Virtual Visit via Video Note   This format is felt to be most appropriate for this patient at this time. Physical exam was limited by quality of the video and audio technology used for the visit.    Patient location: home Provider location: [facility] Persons involved in the visit: patient, provider  I discussed the limitations of evaluation and management by telemedicine and the availability of in person appointments. The patient expressed understanding and agreed to proceed.  Patient: Tricia Rivera   DOB: March 10, 1961   62 y.o. Female  MRN: [id number] Visit Date: 08/11/2023  Today's healthcare provider: Shirlee Latch, MD   No chief complaint on file.  Subjective    HPI  Discussed the use of AI scribe software for clinical note transcription with the patient, who gave verbal consent to proceed.  History of Present Illness   The patient, with a history of breast cancer, hypothyroidism, vitamin D deficiency, anxiety, and prediabetes, presents with fatigue for the past couple of months. She has been under significant stress due to her husband's hospitalization and subsequent need for a feeding tube, as well as the recent death of her sister. She is currently on estrogen blockers for her breast cancer, which she notes can cause liver damage. Her last liver enzymes were elevated. She has been out of her vitamin D prescription and has been waiting for a refill. She also mentions that she has not been taking her Buspar at night recently.        Medications: Outpatient Medications Prior to Visit  Medication Sig   acetaminophen (TYLENOL) 325 MG tablet Take 650 mg by mouth every 4 (four) hours as needed for mild pain.   anastrozole (ARIMIDEX) 1 MG tablet Take 1 tablet (1 mg total) by mouth daily.   clobetasol ointment (TEMOVATE) 0.05 % Apply 1 Application topically 2 (two) times daily.   Ibuprofen 200 MG CAPS Take by mouth.    levothyroxine (SYNTHROID) 50 MCG tablet Take 1 tablet (50 mcg total) by mouth daily.   triamcinolone cream (KENALOG) 0.1 % APPLY TWICE A DAY TO AFFECTED AREAS (Patient taking differently: Apply 1 application  topically 2 (two) times daily as needed. APPLY TWICE A DAY TO AFFECTED AREAS)   ZORYVE 0.3 % CREA Apply 1 application  topically daily as needed (Psoriasis).   [DISCONTINUED] busPIRone (BUSPAR) 10 MG tablet Take 1 tablet (10 mg total) by mouth in the morning. Take with 5 mg in evening   [DISCONTINUED] busPIRone (BUSPAR) 5 MG tablet Take 1 tablet (5 mg total) by mouth at bedtime.   [DISCONTINUED] Vitamin D, Ergocalciferol, (DRISDOL) 1.25 MG (50000 UNIT) CAPS capsule TAKE 1 CAPSULE (50,000 UNITS TOTAL) BY MOUTH EVERY 7 (SEVEN) DAYS   No facility-administered medications prior to visit.    Review of Systems      Objective    There were no vitals taken for this visit.      Physical Exam Constitutional:      General: She is not in acute distress.    Appearance: Normal appearance.  HENT:     Head: Normocephalic.  Pulmonary:     Effort: Pulmonary effort is normal. No respiratory distress.  Neurological:     Mental Status: She is alert and oriented to person, place, and time. Mental status is at baseline.        Assessment & Plan     Problem List Items Addressed This Visit  Endocrine   Hypothyroidism due to acquired atrophy of thyroid   Relevant Orders   TSH     Other   Hyperlipidemia   Relevant Orders   Comprehensive metabolic panel   Lipid panel   Avitaminosis D   Relevant Medications   Vitamin D, Ergocalciferol, (DRISDOL) 1.25 MG (50000 UNIT) CAPS capsule   Other Relevant Orders   VITAMIN D 25 Hydroxy (Vit-D Deficiency, Fractures)   Social anxiety disorder   Relevant Medications   busPIRone (BUSPAR) 10 MG tablet   busPIRone (BUSPAR) 5 MG tablet   Other Visit Diagnoses     Chronic fatigue    -  Primary   Relevant Orders   CBC with  Differential/Platelet   Comprehensive metabolic panel   Vitamin B12   Prediabetes       Relevant Orders   Hemoglobin A1c           Fatigue Chronic fatigue with recent exacerbation. Multiple potential contributing factors including stress, hypothyroidism, vitamin D deficiency, and possible anemia. -Order comprehensive metabolic panel (CMP), complete blood count (CBC), thyroid-stimulating hormone (TSH), vitamin D, and B12 levels. -Refill vitamin D and Buspar prescriptions.  Hyperlipidemia Not currently on medication. -Order lipid panel.  Prediabetes No current medications. -Order Hemoglobin A1c.  Breast Cancer On Arimidex. Recent elevated liver enzymes. -Check liver function tests as part of CMP.  Follow-up Await lab results and communicate via MyChart. Follow-up with new PCP, Dr. Payton Mccallum, in December.        Return if symptoms worsen or fail to improve.     I discussed the assessment and treatment plan with the patient. The patient was provided an opportunity to ask questions and all were answered. The patient agreed with the plan and demonstrated an understanding of the instructions.   The patient was advised to call back or seek an in-person evaluation if the symptoms worsen or if the condition fails to improve as anticipated.   Shirlee Latch, MD [facility] [phone] (phone) [phone] (fax)  [facility]

## 2023-08-12 LAB — CBC WITH DIFFERENTIAL/PLATELET
Basophils Absolute: 0 10*3/uL (ref 0.0–0.2)
Basos: 1 %
EOS (ABSOLUTE): 0.2 10*3/uL (ref 0.0–0.4)
Eos: 4 %
Hematocrit: 43.9 % (ref 34.0–46.6)
Hemoglobin: 14.2 g/dL (ref 11.1–15.9)
Immature Grans (Abs): 0 10*3/uL (ref 0.0–0.1)
Immature Granulocytes: 0 %
Lymphocytes Absolute: 1.4 10*3/uL (ref 0.7–3.1)
Lymphs: 34 %
MCH: 32.5 pg (ref 26.6–33.0)
MCHC: 32.3 g/dL (ref 31.5–35.7)
MCV: 101 fL — ABNORMAL HIGH (ref 79–97)
Monocytes Absolute: 0.5 10*3/uL (ref 0.1–0.9)
Monocytes: 11 %
Neutrophils Absolute: 2.1 10*3/uL (ref 1.4–7.0)
Neutrophils: 50 %
Platelets: 289 10*3/uL (ref 150–450)
RBC: 4.37 x10E6/uL (ref 3.77–5.28)
RDW: 12.9 % (ref 11.7–15.4)
WBC: 4.2 10*3/uL (ref 3.4–10.8)

## 2023-08-12 LAB — COMPREHENSIVE METABOLIC PANEL
ALT: 24 IU/L (ref 0–32)
AST: 23 IU/L (ref 0–40)
Albumin: 4.4 g/dL (ref 3.9–4.9)
Alkaline Phosphatase: 60 IU/L (ref 44–121)
BUN/Creatinine Ratio: 16 (ref 12–28)
BUN: 13 mg/dL (ref 8–27)
Bilirubin Total: 0.4 mg/dL (ref 0.0–1.2)
CO2: 24 mmol/L (ref 20–29)
Calcium: 9.9 mg/dL (ref 8.7–10.3)
Chloride: 101 mmol/L (ref 96–106)
Creatinine, Ser: 0.82 mg/dL (ref 0.57–1.00)
Globulin, Total: 2.5 g/dL (ref 1.5–4.5)
Glucose: 103 mg/dL — ABNORMAL HIGH (ref 70–99)
Potassium: 4.5 mmol/L (ref 3.5–5.2)
Sodium: 141 mmol/L (ref 134–144)
Total Protein: 6.9 g/dL (ref 6.0–8.5)
eGFR: 80 mL/min/{1.73_m2} (ref >59)

## 2023-08-12 LAB — LIPID PANEL
Chol/HDL Ratio: 3.2 ratio (ref 0.0–4.4)
Cholesterol, Total: 292 mg/dL — ABNORMAL HIGH (ref 100–199)
HDL: 92 mg/dL (ref >39)
LDL Chol Calc (NIH): 171 mg/dL — ABNORMAL HIGH (ref 0–99)
Triglycerides: 165 mg/dL — ABNORMAL HIGH (ref 0–149)
VLDL Cholesterol Cal: 29 mg/dL (ref 5–40)

## 2023-08-12 LAB — HEMOGLOBIN A1C
Est. average glucose Bld gHb Est-mCnc: 114 mg/dL
Hgb A1c MFr Bld: 5.6 % (ref 4.8–5.6)

## 2023-08-12 LAB — VITAMIN B12: Vitamin B-12: 639 pg/mL (ref 232–1245)

## 2023-08-12 LAB — TSH: TSH: 6.97 u[IU]/mL — ABNORMAL HIGH (ref 0.450–4.500)

## 2023-08-12 LAB — VITAMIN D 25 HYDROXY (VIT D DEFICIENCY, FRACTURES): Vit D, 25-Hydroxy: 66.8 ng/mL (ref 30.0–100.0)

## 2023-08-14 ENCOUNTER — Other Ambulatory Visit: Payer: Self-pay

## 2023-08-14 DIAGNOSIS — E034 Atrophy of thyroid (acquired): Secondary | ICD-10-CM

## 2023-08-14 MED ORDER — LEVOTHYROXINE SODIUM 75 MCG PO TABS: 75.0000 ug | ORAL_TABLET | Freq: Every day | ORAL | 1 refills | Status: DC

## 2023-09-08 ENCOUNTER — Ambulatory Visit: Payer: BC Managed Care – PPO

## 2023-09-08 ENCOUNTER — Other Ambulatory Visit: Payer: BC Managed Care – PPO

## 2023-09-08 ENCOUNTER — Ambulatory Visit: Payer: BC Managed Care – PPO | Admitting: Nurse Practitioner

## 2023-09-15 ENCOUNTER — Encounter: Payer: Self-pay | Admitting: Nurse Practitioner

## 2023-09-15 ENCOUNTER — Inpatient Hospital Stay: Payer: BC Managed Care – PPO

## 2023-09-15 ENCOUNTER — Inpatient Hospital Stay (HOSPITAL_BASED_OUTPATIENT_CLINIC_OR_DEPARTMENT_OTHER): Payer: BC Managed Care – PPO | Admitting: Nurse Practitioner

## 2023-09-15 ENCOUNTER — Inpatient Hospital Stay: Payer: BC Managed Care – PPO | Attending: Internal Medicine

## 2023-09-15 VITALS — BP 124/80 | HR 76 | Temp 96.7°F | Resp 18

## 2023-09-15 VITALS — BP 131/75 | HR 82 | Temp 97.7°F | Ht 64.0 in | Wt 133.2 lb

## 2023-09-15 DIAGNOSIS — Z170 Estrogen receptor positive status [ER+]: Secondary | ICD-10-CM

## 2023-09-15 DIAGNOSIS — M8580 Other specified disorders of bone density and structure, unspecified site: Secondary | ICD-10-CM

## 2023-09-15 DIAGNOSIS — Z79811 Long term (current) use of aromatase inhibitors: Secondary | ICD-10-CM

## 2023-09-15 DIAGNOSIS — M85852 Other specified disorders of bone density and structure, left thigh: Secondary | ICD-10-CM | POA: Insufficient documentation

## 2023-09-15 DIAGNOSIS — C50812 Malignant neoplasm of overlapping sites of left female breast: Secondary | ICD-10-CM

## 2023-09-15 DIAGNOSIS — Z79899 Other long term (current) drug therapy: Secondary | ICD-10-CM | POA: Insufficient documentation

## 2023-09-15 DIAGNOSIS — Z08 Encounter for follow-up examination after completed treatment for malignant neoplasm: Secondary | ICD-10-CM | POA: Diagnosis not present

## 2023-09-15 DIAGNOSIS — Z5181 Encounter for therapeutic drug level monitoring: Secondary | ICD-10-CM

## 2023-09-15 DIAGNOSIS — Z923 Personal history of irradiation: Secondary | ICD-10-CM | POA: Diagnosis not present

## 2023-09-15 LAB — CBC WITH DIFFERENTIAL (CANCER CENTER ONLY)
Abs Immature Granulocytes: 0.01 10*3/uL (ref 0.00–0.07)
Basophils Absolute: 0.1 10*3/uL (ref 0.0–0.1)
Basophils Relative: 1 %
Eosinophils Absolute: 0.1 10*3/uL (ref 0.0–0.5)
Eosinophils Relative: 4 %
HCT: 41.4 % (ref 36.0–46.0)
Hemoglobin: 14 g/dL (ref 12.0–15.0)
Immature Granulocytes: 0 %
Lymphocytes Relative: 37 %
Lymphs Abs: 1.5 10*3/uL (ref 0.7–4.0)
MCH: 32.9 pg (ref 26.0–34.0)
MCHC: 33.8 g/dL (ref 30.0–36.0)
MCV: 97.4 fL (ref 80.0–100.0)
Monocytes Absolute: 0.4 10*3/uL (ref 0.1–1.0)
Monocytes Relative: 9 %
Neutro Abs: 2 10*3/uL (ref 1.7–7.7)
Neutrophils Relative %: 49 %
Platelet Count: 321 10*3/uL (ref 150–400)
RBC: 4.25 MIL/uL (ref 3.87–5.11)
RDW: 13.8 % (ref 11.5–15.5)
WBC Count: 4 10*3/uL (ref 4.0–10.5)
nRBC: 0 % (ref 0.0–0.2)

## 2023-09-15 LAB — CMP (CANCER CENTER ONLY)
ALT: 38 U/L (ref 0–44)
AST: 37 U/L (ref 15–41)
Albumin: 4.4 g/dL (ref 3.5–5.0)
Alkaline Phosphatase: 60 U/L (ref 38–126)
Anion gap: 9 (ref 5–15)
BUN: 15 mg/dL (ref 8–23)
CO2: 26 mmol/L (ref 22–32)
Calcium: 9.3 mg/dL (ref 8.9–10.3)
Chloride: 103 mmol/L (ref 98–111)
Creatinine: 0.75 mg/dL (ref 0.44–1.00)
GFR, Estimated: >60 mL/min (ref >60)
Glucose, Bld: 111 mg/dL — ABNORMAL HIGH (ref 70–99)
Potassium: 4.2 mmol/L (ref 3.5–5.1)
Sodium: 138 mmol/L (ref 135–145)
Total Bilirubin: 0.6 mg/dL (ref 0.3–1.2)
Total Protein: 7.8 g/dL (ref 6.5–8.1)

## 2023-09-15 LAB — VITAMIN D 25 HYDROXY (VIT D DEFICIENCY, FRACTURES): Vit D, 25-Hydroxy: 102.23 ng/mL — ABNORMAL HIGH (ref 30–100)

## 2023-09-15 MED ORDER — ZOLEDRONIC ACID 5 MG/100ML IV SOLN
5.0000 mg | Freq: Once | INTRAVENOUS | Status: AC
  Administered 2023-09-15: 5 mg via INTRAVENOUS
  Filled 2023-09-15: qty 100

## 2023-09-15 MED ORDER — SODIUM CHLORIDE 0.9 % IV SOLN
Freq: Once | INTRAVENOUS | Status: AC
  Filled 2023-09-15: qty 250

## 2023-09-15 NOTE — Progress Notes (Unsigned)
[facility] CONSULT NOTE  Patient Care Team: Alfredia Ferguson, PA-C as PCP - General (Physician Assistant) Scarlett Presto, RN (Inactive) as Oncology Nurse Navigator Earna Coder, MD as Consulting Physician (Oncology) Lemar Livings Merrily Pew, MD as Consulting Physician (General Surgery) Carmina Miller, MD as Consulting Physician (Radiation Oncology)  CHIEF COMPLAINTS/PURPOSE OF CONSULTATION: Breast cancer  Oncology History Overview Note  #Invasive lobular breast cancere ER;PR Positive; her 2 Negative; stage IA. mT [71mm]; pN0; s/p lumpectomy; Oncotype -;   # IMPRESSION: 1. A 6 mm mass in the LEFT inner breast at 9 o'clock 1 cm from the nipple with associated architectural distortion is indeterminate. Recommend ultrasound-guided biopsy for definitive characterization with attention on post marker placement mammogram to assess for mammographic/sonographic correlation. 2. No suspicious LEFT axillary adenopathy.  DIAGNOSIS:  A.  BREAST, LEFT 9:00 1 CM FN; ULTRASOUND-GUIDED BIOPSY:  - INVASIVE MAMMARY CARCINOMA WITH LOBULAR FEATURES.  - LOBULAR NEOPLASIA.   Size of invasive carcinoma: 8 mm in this sample  Histologic grade of invasive carcinoma: Grade 2                       Glandular/tubular differentiation score: 3                       Nuclear pleomorphism score: 2                       Mitotic rate score: 1                       Total score: 6  Ductal carcinoma in situ: Not identified  Lymphovascular invasion: Not identified   ER/PR/HER2: Immunohistochemistry will be performed on block A1, with  reflex to FISH for HER2 2+. The results will be reported in an addendum.   Comment:  The definitive grade will be assigned on the excisional specimen.   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  Oncotype low risk. MAY 12th, 2023- Anastrazole.[Stopped sec to MSK]  # MID JUNE 2023- LETROZOLE [sec to  MSK stopped April 2024]; MAY 2024- start examestane   Carcinoma of overlapping sites of left breast in female, estrogen receptor positive (HCC)  02/07/2022 Initial Diagnosis   Carcinoma of overlapping sites of left breast in female, estrogen receptor positive (HCC)   02/07/2022 Cancer Staging   Staging form: Breast, AJCC 8th Edition - Pathologic: Stage IA (pT1c, pN0, cM0, G2, ER+, PR+, HER2-) - Signed by Earna Coder, MD on 02/07/2022 Histologic grading system: 3 grade system    Genetic Testing   Negative genetic testing. No pathogenic variants identified on the [facility] CancerNext-Expanded+RNA panel. The report date is 02/21/2022.  The CancerNext-Expanded + RNAinsight gene panel offered by [employer] and includes sequencing and rearrangement analysis for the following 77 genes: IP, ALK, APC*, ATM*, AXIN2, BAP1, BARD1, BLM, BMPR1A, BRCA1*, BRCA2*, BRIP1*, CDC73, CDH1*,CDK4, CDKN1B, CDKN2A, CHEK2*, CTNNA1, DICER1, FANCC, FH, FLCN, GALNT12, KIF1B, LZTR1, MAX, MEN1, MET, MLH1*, MSH2*, MSH3, MSH6*, MUTYH*, NBN, NF1*, NF2, NTHL1, PALB2*, PHOX2B, PMS2*, POT1, PRKAR1A, PTCH1, PTEN*, RAD51C*, RAD51D*,RB1, RECQL, RET, SDHA, SDHAF2, SDHB, SDHC, SDHD, SMAD4, SMARCA4, SMARCB1, SMARCE1, STK11, SUFU, TMEM127, TP53*,TSC1, TSC2, VHL and XRCC2 (sequencing and deletion/duplication); EGFR, EGLN1, HOXB13, KIT, MITF, PDGFRA, POLD1 and POLE (sequencing only); EPCAM and GREM1 (deletion/duplication only).    HISTORY OF PRESENTING ILLNESS: Patient ambulating independently.  Alone.  Tricia Rivera 62  y.o.  female with a history of stage I ER/PR breast cancer currently on adjuvant AI is here for follow-up/proceed with reclast.  patient is having trigger fingers issues and "really bad back pain" she has been taking ibuprofen and using tens unit.  Noted to have bilateral trigger fingers.  Was wondering if this is a side effect of letrozole.   Otherwise, patient is doing well on the Letrozole.  Patient denies any  dental pain.  Otherwise no worsening joint pains or back pain.   Review of Systems  Constitutional:  Negative for chills, diaphoresis, fever, malaise/fatigue and weight loss.  HENT:  Negative for nosebleeds and sore throat.   Eyes:  Negative for double vision.  Respiratory:  Negative for cough, hemoptysis, sputum production, shortness of breath and wheezing.   Cardiovascular:  Negative for chest pain, palpitations, orthopnea and leg swelling.  Gastrointestinal:  Negative for abdominal pain, blood in stool, constipation, diarrhea, heartburn, melena, nausea and vomiting.  Genitourinary:  Negative for dysuria, frequency and urgency.  Musculoskeletal:  Positive for back pain and joint pain.  Skin: Negative.  Negative for itching and rash.  Neurological:  Negative for dizziness, tingling, focal weakness, weakness and headaches.  Endo/Heme/Allergies:  Does not bruise/bleed easily.  Psychiatric/Behavioral:  Negative for depression. The patient is not nervous/anxious and does not have insomnia.      MEDICAL HISTORY:  Past Medical History:  Diagnosis Date   ADD (attention deficit disorder)    Anxiety    Breast cancer (HCC) 01/2022   Depression    Family history of breast cancer    Family history of liver cancer    Family history of pancreatic cancer    Hyperlipidemia    Malignant neoplasm of left breast (HCC)    Personal history of radiation therapy    Psoriasis    Vitamin D deficiency     SURGICAL HISTORY: Past Surgical History:  Procedure Laterality Date   BREAST BIOPSY Left 01/08/2022   [place] bs, venus marker, invasive mammary   BREAST LUMPECTOMY Left 01/24/2022   BREAST LUMPECTOMY WITH SENTINEL LYMPH NODE BIOPSY Left 01/24/2022   Procedure: BREAST LUMPECTOMY WITH SENTINEL AXIILLARY LYMPH NODE BX;  Surgeon: Earline Mayotte, MD;  Location: [facility] ORS;  Service: General;  Laterality: Left;   BUNIONECTOMY  12/01/2008   BUNIONECTOMY N/A    2015   COLONOSCOPY     COLONOSCOPY WITH  PROPOFOL N/A 07/16/2022   Procedure: COLONOSCOPY WITH PROPOFOL;  Surgeon: Earline Mayotte, MD;  Location: [facility] ENDOSCOPY;  Service: Endoscopy;  Laterality: N/A;    SOCIAL HISTORY: Social History   Socioeconomic History   Marital status: Married    Spouse name: Not on file   Number of children: Not on file   Years of education: Not on file   Highest education level: Bachelor's degree (e.g., BA, AB, BS)  Occupational History   Not on file  Tobacco Use   Smoking status: Never   Smokeless tobacco: Never  Vaping Use   Vaping status: Never Used  Substance and Sexual Activity   Alcohol use: Not Currently    Alcohol/week: 3.0 standard drinks of alcohol    Types: 3 Glasses of wine per week   Drug use: No   Sexual activity: Not Currently  Other Topics Concern   Not on file  Social History Narrative   Lives in [place]; with husband; son' grandson. Daughter- in [place]. 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    Social Determinants of Health   Financial  Resource Strain: Low Risk  (02/23/2023)   Overall Financial Resource Strain (CARDIA)    Difficulty of Paying Living Expenses: Not hard at all  Food Insecurity: No Food Insecurity (02/23/2023)   Hunger Vital Sign    Worried About Running Out of Food in the Last Year: Never true    Ran Out of Food in the Last Year: Never true  Transportation Needs: No Transportation Needs (02/23/2023)   PRAPARE - Administrator, Civil Service (Medical): No    Lack of Transportation (Non-Medical): No  Physical Activity: Sufficiently Active (02/23/2023)   Exercise Vital Sign    Days of Exercise per Week: 3 days    Minutes of Exercise per Session: 50 min  Stress: No Stress Concern Present (02/23/2023)   [employer] of Occupational Health - Occupational Stress Questionnaire    Feeling of Stress : Only a little  Social Connections: Moderately Integrated (02/23/2023)   Social Connection and Isolation Panel [NHANES]     Frequency of Communication with Friends and Family: More than three times a week    Frequency of Social Gatherings with Friends and Family: Once a week    Attends Religious Services: More than 4 times per year    Active Member of [employer] or Organizations: No    Attends Engineer, structural: Not on file    Marital Status: Married  Catering manager Violence: Not on file    FAMILY HISTORY: Family History  Problem Relation Age of Onset   Pancreatic cancer Mother    Liver cancer Father    Breast cancer Maternal Aunt    Liver cancer Maternal Grandfather     ALLERGIES:  has No Known Allergies.  MEDICATIONS:  Current Outpatient Medications  Medication Sig Dispense Refill   acetaminophen (TYLENOL) 325 MG tablet Take 650 mg by mouth every 4 (four) hours as needed for mild pain.     anastrozole (ARIMIDEX) 1 MG tablet Take 1 tablet (1 mg total) by mouth daily. 90 tablet 1   busPIRone (BUSPAR) 10 MG tablet Take 1 tablet (10 mg total) by mouth in the morning. Take with 5 mg in evening 90 tablet 1   busPIRone (BUSPAR) 5 MG tablet Take 1 tablet (5 mg total) by mouth at bedtime. 90 tablet 1   clobetasol ointment (TEMOVATE) 0.05 % Apply 1 Application topically 2 (two) times daily.     Ibuprofen 200 MG CAPS Take by mouth.     levothyroxine (SYNTHROID) 75 MCG tablet Take 1 tablet (75 mcg total) by mouth daily before breakfast. 90 tablet 1   triamcinolone cream (KENALOG) 0.1 % APPLY TWICE A DAY TO AFFECTED AREAS (Patient taking differently: Apply 1 application  topically 2 (two) times daily as needed. APPLY TWICE A DAY TO AFFECTED AREAS) 30 g 6   Vitamin D, Ergocalciferol, (DRISDOL) 1.25 MG (50000 UNIT) CAPS capsule Take 1 capsule (50,000 Units total) by mouth every 7 (seven) days. 12 capsule 1   ZORYVE 0.3 % CREA Apply 1 application  topically daily as needed (Psoriasis).     No current facility-administered medications for this visit.      Marland Kitchen  PHYSICAL EXAMINATION: ECOG PERFORMANCE  STATUS: 0 - Asymptomatic  Vitals:   09/15/23 0906  BP: 131/75  Pulse: 82  Temp: 97.7 F (36.5 C)  SpO2: 98%   Filed Weights   09/15/23 0906  Weight: 133 lb 3.2 oz (60.4 kg)    Physical Exam Vitals and nursing note reviewed.  HENT:  Head: Normocephalic and atraumatic.     Mouth/Throat:     Pharynx: Oropharynx is clear.  Eyes:     Extraocular Movements: Extraocular movements intact.     Pupils: Pupils are equal, round, and reactive to light.  Cardiovascular:     Rate and Rhythm: Normal rate and regular rhythm.  Pulmonary:     Comments: Decreased breath sounds bilaterally.  Abdominal:     Palpations: Abdomen is soft.  Musculoskeletal:        General: Normal range of motion.     Cervical back: Normal range of motion.  Skin:    General: Skin is warm.  Neurological:     General: No focal deficit present.     Mental Status: She is alert and oriented to person, place, and time.  Psychiatric:        Behavior: Behavior normal.        Judgment: Judgment normal.    LABORATORY DATA:  I have reviewed the data as listed Lab Results  Component Value Date   WBC 4.0 09/15/2023   HGB 14.0 09/15/2023   HCT 41.4 09/15/2023   MCV 97.4 09/15/2023   PLT 321 09/15/2023   Recent Labs    03/09/23 0955 08/11/23 0832 09/15/23 0908  NA 138 141 138  K 4.3 4.5 4.2  CL 105 101 103  CO2 25 24 26   GLUCOSE 108* 103* 111*  BUN 15 13 15   CREATININE 0.87 0.82 0.75  CALCIUM 9.1 9.9 9.3  GFRNONAA >60  --  >60  PROT 7.8 6.9 7.8  ALBUMIN 4.5 4.4 4.4  AST 38 23 37  ALT 55* 24 38  ALKPHOS 51 60 60  BILITOT 0.7 0.4 0.6    RADIOGRAPHIC STUDIES: I have personally reviewed the radiological images as listed and agreed with the findings in the report. No results found.  ASSESSMENT & PLAN:   Carcinoma of overlapping sites of left breast in female, estrogen receptor positive (HCC) # MARCH 2023- invasive lobular breast cancer ER;PR Positive; her 2 Negative; stage IA; low risk Oncotype.   No benefit from chemotherapy.  Patient s/p radiation. currently on letrozole [intol to anastrozole; since mid June 2023] [Dr.Byrnet/Dr.Sakai- JAN 2024- Bil Mammo- WNL. Tolerating letrozole poorly [MSK- see below]. Recommend examestane. Now back on anastrozole.    # MSK- G-2 [trigger fingers; back pain]- sec to letrozole. See above.    #Osteopenia: [June 2023]- The BMD measured at Femur Neck Left  T-score of -2.1.  I discussed at length the potential risk factors for osteoporosis including age, gender, postmenopausal, use of AI. Started reclast October 2023. Tolerating well. Continue use of calcium, vitamin D. Encouraged weight bearing exercise and weight lifting to help strengthen bones. Repeat BMD June 2025.    # Slightly elevated- AST- ?  Related to fatty liver versus others.  Recommend decreasing Tylenol/ibuprofen [2-4 a day for many years]. Discussed re: lean meat/veggies; nuts.  Discussed re: processed food.  09/05/22- Reclast   # DISPOSITION:  Reclast today January Mammogram 6 mo- lab (cbc, cmp), Dr Donneta Romberg- [place]    No problem-specific Assessment & Plan notes found for this encounter.   All questions were answered. The patient/family knows to call the clinic with any problems, questions or concerns.    Alinda Dooms, NP 09/15/2023 9:47 AM

## 2023-09-15 NOTE — Progress Notes (Unsigned)
No questions/concerns today.

## 2023-09-16 ENCOUNTER — Telehealth: Payer: Self-pay | Admitting: Physician Assistant

## 2023-09-16 ENCOUNTER — Encounter: Payer: Self-pay | Admitting: Internal Medicine

## 2023-09-16 NOTE — Telephone Encounter (Signed)
Patient reports she usually take Vitamin D 91478 every Wednesday for years but noticed her levels are high. She would like to know if she should stop taking them, reduce the units she is consuming or continue taking. Please advise

## 2023-09-16 NOTE — Telephone Encounter (Signed)
Seen by patient Beecher Mcardle on 09/15/2023 10:27 AM

## 2023-09-16 NOTE — Telephone Encounter (Signed)
Pt called in for lab results of vitamin D taste. Please cb when available

## 2023-09-17 NOTE — Telephone Encounter (Signed)
Stop vit D supplement

## 2023-09-17 NOTE — Telephone Encounter (Signed)
Patient advised. Verbalized understanding 

## 2023-09-23 ENCOUNTER — Other Ambulatory Visit: Payer: Self-pay

## 2023-09-23 DIAGNOSIS — E559 Vitamin D deficiency, unspecified: Secondary | ICD-10-CM

## 2023-11-08 ENCOUNTER — Other Ambulatory Visit: Payer: Self-pay | Admitting: Internal Medicine

## 2023-11-08 ENCOUNTER — Other Ambulatory Visit: Payer: Self-pay | Admitting: Family Medicine

## 2023-11-26 ENCOUNTER — Encounter: Payer: BC Managed Care – PPO | Admitting: Family Medicine

## 2023-11-27 ENCOUNTER — Encounter: Payer: BC Managed Care – PPO | Admitting: Family Medicine

## 2023-11-27 DIAGNOSIS — Z0000 Encounter for general adult medical examination without abnormal findings: Secondary | ICD-10-CM | POA: Insufficient documentation

## 2023-11-27 NOTE — Assessment & Plan Note (Deleted)
DEXA scan 06/04/2022: Femur Neck Left is 0.753 g/cm2 with a T-score of -2.1.

## 2023-11-27 NOTE — Progress Notes (Deleted)
Complete physical exam   Patient: Tricia Rivera   DOB: 10-25-1961   62 y.o. Female  MRN: [id number] Visit Date: 11/27/2023  Today's healthcare provider: Sherlyn Hay, DO   No chief complaint on file.  Subjective    Tricia Rivera is a 62 y.o. female who presents today for a complete physical exam.  She reports consuming a {diet types:17450} diet. {Exercise:19826} She generally feels {well/fairly well/poorly:18703}. She reports sleeping {well/fairly well/poorly:18703}. She {does/does not:200015} have additional problems to discuss today.  HPI    ***  Past Medical History:  Diagnosis Date   ADD (attention deficit disorder)    Anxiety    Breast cancer (HCC) 01/2022   Depression    Family history of breast cancer    Family history of liver cancer    Family history of pancreatic cancer    Hyperlipidemia    Malignant neoplasm of left breast (HCC)    Personal history of radiation therapy    Psoriasis    Vitamin D deficiency    Past Surgical History:  Procedure Laterality Date   BREAST BIOPSY Left 01/08/2022   [place] bs, venus marker, invasive mammary   BREAST LUMPECTOMY Left 01/24/2022   BREAST LUMPECTOMY WITH SENTINEL LYMPH NODE BIOPSY Left 01/24/2022   Procedure: BREAST LUMPECTOMY WITH SENTINEL AXIILLARY LYMPH NODE BX;  Surgeon: Earline Mayotte, MD;  Location: [facility] ORS;  Service: General;  Laterality: Left;   BUNIONECTOMY  12/01/2008   BUNIONECTOMY N/A    2015   COLONOSCOPY     COLONOSCOPY WITH PROPOFOL N/A 07/16/2022   Procedure: COLONOSCOPY WITH PROPOFOL;  Surgeon: Earline Mayotte, MD;  Location: [facility] ENDOSCOPY;  Service: Endoscopy;  Laterality: N/A;   Social History   Socioeconomic History   Marital status: Married    Spouse name: Not on file   Number of children: Not on file   Years of education: Not on file   Highest education level: Bachelor's degree (e.g., BA, AB, BS)  Occupational History   Not on file  Tobacco Use   Smoking status: Never    Smokeless tobacco: Never  Vaping Use   Vaping status: Never Used  Substance and Sexual Activity   Alcohol use: Not Currently    Alcohol/week: 3.0 standard drinks of alcohol    Types: 3 Glasses of wine per week   Drug use: No   Sexual activity: Not Currently  Other Topics Concern   Not on file  Social History Narrative   Lives in [place]; with husband; son' grandson. Daughter- in [place]. 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    Social Drivers of Corporate investment banker Strain: Low Risk  (11/26/2023)   Overall Financial Resource Strain (CARDIA)    Difficulty of Paying Living Expenses: Not hard at all  Food Insecurity: No Food Insecurity (11/26/2023)   Hunger Vital Sign    Worried About Running Out of Food in the Last Year: Never true    Ran Out of Food in the Last Year: Never true  Transportation Needs: No Transportation Needs (11/26/2023)   PRAPARE - Administrator, Civil Service (Medical): No    Lack of Transportation (Non-Medical): No  Physical Activity: Insufficiently Active (11/26/2023)   Exercise Vital Sign    Days of Exercise per Week: 3 days    Minutes of Exercise per Session: 20 min  Stress: Stress Concern Present (11/26/2023)   [employer] of Occupational Health - Occupational Stress Questionnaire  Feeling of Stress : To some extent  Social Connections: Unknown (11/26/2023)   Social Connection and Isolation Panel [NHANES]    Frequency of Communication with Friends and Family: Three times a week    Frequency of Social Gatherings with Friends and Family: Once a week    Attends Religious Services: Patient declined    Active Member of Clubs or Organizations: No    Attends Engineer, structural: Not on file    Marital Status: Married  Intimate Partner Violence: Not on file   Family Status  Relation Name Status   Mother  Deceased at age January 21, 2015       died from pancreatic cancer   Father  Deceased at age 71        Liver Cancer   Sister  Alive   Sister  Alive   Mat Aunt  Deceased   MGF  Deceased       died of liver cancer.   PGF  Deceased  No partnership data on file   Family History  Problem Relation Age of Onset   Pancreatic cancer Mother    Liver cancer Father    Breast cancer Maternal Aunt    Liver cancer Maternal Grandfather    No Known Allergies  Patient Care Team: Shadrack Brummitt, Monico Blitz, DO as PCP - General (Family Medicine) Scarlett Presto, RN (Inactive) as Oncology Nurse Navigator Earna Coder, MD as Consulting Physician (Oncology) Lemar Livings, Merrily Pew, MD as Consulting Physician (General Surgery) Carmina Miller, MD as Consulting Physician (Radiation Oncology)   Medications: Outpatient Medications Prior to Visit  Medication Sig   acetaminophen (TYLENOL) 325 MG tablet Take 650 mg by mouth every 4 (four) hours as needed for mild pain.   anastrozole (ARIMIDEX) 1 MG tablet TAKE 1 TABLET BY MOUTH EVERY DAY *DISCONTINUE EXEMESTANE**   busPIRone (BUSPAR) 10 MG tablet Take 1 tablet (10 mg total) by mouth in the morning. Take with 5 mg in evening   busPIRone (BUSPAR) 5 MG tablet Take 1 tablet (5 mg total) by mouth at bedtime.   clobetasol ointment (TEMOVATE) 0.05 % Apply 1 Application topically 2 (two) times daily.   Ibuprofen 200 MG CAPS Take by mouth.   levothyroxine (SYNTHROID) 75 MCG tablet Take 1 tablet (75 mcg total) by mouth daily before breakfast.   triamcinolone cream (KENALOG) 0.1 % APPLY TWICE A DAY TO AFFECTED AREAS (Patient taking differently: Apply 1 application  topically 2 (two) times daily as needed. APPLY TWICE A DAY TO AFFECTED AREAS)   Vitamin D, Ergocalciferol, (DRISDOL) 1.25 MG (50000 UNIT) CAPS capsule Take 1 capsule (50,000 Units total) by mouth every 7 (seven) days.   ZORYVE 0.3 % CREA Apply 1 application  topically daily as needed (Psoriasis).   No facility-administered medications prior to visit.    Review of Systems {Insert previous labs  (optional):23779} {See past labs  Heme  Chem  Endocrine  Serology  Results Review (optional):1}  Objective    There were no vitals taken for this visit. {Insert last BP/Wt (optional):23777}{See vitals history (optional):1}  Physical Exam    Last depression screening scores    02/27/2023    8:28 AM 12/30/2022    8:20 AM 11/21/2022    8:18 AM  PHQ 2/9 Scores  PHQ - 2 Score 0 0 0  PHQ- 9 Score 2 3 0   Last fall risk screening    02/27/2023    8:28 AM  Fall Risk   Falls in the past year? 0  Number falls in past yr: 0  Risk for fall due to : No Fall Risks   Last Audit-C alcohol use screening    11/26/2023    6:02 PM  Alcohol Use Disorder Test (AUDIT)  1. How often do you have a drink containing alcohol? 0  3. How often do you have six or more drinks on one occasion? 0   A score of 3 or more in women, and 4 or more in men indicates increased risk for alcohol abuse, EXCEPT if all of the points are from question 1   No results found for any visits on 11/27/23.  Assessment & Plan    Routine Health Maintenance and Physical Exam  Exercise Activities and Dietary recommendations  Goals   None     Immunization History  Administered Date(s) Administered   Influenza Inj Mdck Quad Pf 09/09/2021   Influenza,inj,Quad PF,6+ Mos 09/13/2019   Moderna Sars-Covid-2 Vaccination 10/01/2020   PFIZER(Purple Top)SARS-COV-2 Vaccination 03/05/2020, 03/27/2020   Td 02/03/2011, 06/08/2017   Tdap 08/10/2007, 01/31/2011    Health Maintenance  Topic Date Due   Pneumococcal Vaccine 30-40 Years old (1 of 2 - PCV) Never done   Zoster Vaccines- Shingrix (1 of 2) Never done   INFLUENZA VACCINE  07/02/2023   COVID-19 Vaccine (4 - 2024-25 season) 08/02/2023   MAMMOGRAM  12/16/2024   DTaP/Tdap/Td (5 - Td or Tdap) 06/09/2027   Cervical Cancer Screening (HPV/Pap Cotest)  11/22/2027   Colonoscopy  07/16/2032   Hepatitis C Screening  Completed   HIV Screening  Completed   HPV VACCINES  Aged  Out    Discussed health benefits of physical activity, and encouraged her to engage in regular exercise appropriate for her age and condition.   There are no diagnoses linked to this encounter.    ***  No follow-ups on file.     I discussed the assessment and treatment plan with the patient  The patient was provided an opportunity to ask questions and all were answered. The patient agreed with the plan and demonstrated an understanding of the instructions.   The patient was advised to call back or seek an in-person evaluation if the symptoms worsen or if the condition fails to improve as anticipated.    Sherlyn Hay, DO  [facility] [phone] (phone) [phone] (fax)  [facility]

## 2023-11-30 ENCOUNTER — Encounter: Payer: Self-pay | Admitting: Internal Medicine

## 2023-12-03 ENCOUNTER — Encounter: Payer: Self-pay | Admitting: Internal Medicine

## 2023-12-03 ENCOUNTER — Ambulatory Visit: Payer: 59 | Admitting: Family Medicine

## 2023-12-03 ENCOUNTER — Encounter: Payer: Self-pay | Admitting: Family Medicine

## 2023-12-03 VITALS — BP 143/81 | HR 81 | Temp 98.1°F | Ht 64.0 in | Wt 133.0 lb

## 2023-12-03 DIAGNOSIS — F4010 Social phobia, unspecified: Secondary | ICD-10-CM | POA: Diagnosis not present

## 2023-12-03 DIAGNOSIS — M65331 Trigger finger, right middle finger: Secondary | ICD-10-CM

## 2023-12-03 DIAGNOSIS — Z0001 Encounter for general adult medical examination with abnormal findings: Secondary | ICD-10-CM

## 2023-12-03 DIAGNOSIS — E039 Hypothyroidism, unspecified: Secondary | ICD-10-CM

## 2023-12-03 DIAGNOSIS — E559 Vitamin D deficiency, unspecified: Secondary | ICD-10-CM

## 2023-12-03 DIAGNOSIS — Z0000 Encounter for general adult medical examination without abnormal findings: Secondary | ICD-10-CM

## 2023-12-03 DIAGNOSIS — R030 Elevated blood-pressure reading, without diagnosis of hypertension: Secondary | ICD-10-CM | POA: Diagnosis not present

## 2023-12-03 DIAGNOSIS — R7303 Prediabetes: Secondary | ICD-10-CM

## 2023-12-03 DIAGNOSIS — Z23 Encounter for immunization: Secondary | ICD-10-CM

## 2023-12-03 DIAGNOSIS — E782 Mixed hyperlipidemia: Secondary | ICD-10-CM

## 2023-12-03 DIAGNOSIS — L409 Psoriasis, unspecified: Secondary | ICD-10-CM

## 2023-12-03 MED ORDER — BUSPIRONE HCL 15 MG PO TABS: 15.0000 mg | ORAL_TABLET | Freq: Every day | ORAL | 5 refills | Status: DC

## 2023-12-03 NOTE — Assessment & Plan Note (Signed)
 Historically normal BP levels, elevated today. Goal SBP<120; DBP<80.  Low sodium diet  Recommended at home monitoring weekly.  Increase weekly exercise.

## 2023-12-03 NOTE — Assessment & Plan Note (Signed)
 Increase synthroid to in September, will recheck TSH + T4 Denies hypothyroid symptoms.

## 2023-12-03 NOTE — Assessment & Plan Note (Addendum)
 Elevated Vitamin D levels in September was told to hold supplement, has not taken since. Will recheck lab.

## 2023-12-03 NOTE — Assessment & Plan Note (Signed)
 Continue Dermatology for Psoriasis management Continue kenalog for treatment mgmt.

## 2023-12-03 NOTE — Assessment & Plan Note (Signed)
-  Pt has mammogram scheduled as patient has history of breast cancer. -Advise on improving diet, including adding protein shakes given lack of appetite. -Encourage regular exercise, such as daily walks. -Encourage self-care -Advise on potential need for ophthalmology referral if vision worsens, currently wears readers.

## 2023-12-03 NOTE — Assessment & Plan Note (Signed)
 Currently on Buspar 15mg  in the morning. -Change prescription to reflect pt current usage (15mg  in the morning). - Denies use in pm. - declined psych referral for CBT today

## 2023-12-03 NOTE — Progress Notes (Signed)
 Complete physical exam  Patient: Tricia Rivera   DOB: 01-02-1961   63 y.o. Female  MRN: [id number]  Introduced to nurse practitioner role and practice setting.  All questions answered.  Discussed provider/patient relationship and expectations.   Subjective:    Chief Complaint  Patient presents with   Annual Exam    Tricia Rivera is a 63 y.o. female who presents today for a complete physical exam. She reports consuming a  not the best  diet. Only eats once or twice per day. Since husband had feeding tube, she had decreased her eating habits. The patient does not participate in regular exercise at present. She's tries to walk twice per week with husband. She generally feels fairly well. She reports sleeping fairly well. She does have additional problems to discuss today.   The patient, a retired runner, broadcasting/film/video, presents for a routine physical examination. She has a history of breast cancer, for which she is currently on an estrogen blocker (Arimidex ). She reports that her triglycerides were previously elevated and expresses a desire to monitor her liver enzymes due to the medication.  The patient also has a history of hypothyroidism and is currently on Synthroid , which was recently increased to 75 mcg in September. She reports no current issues with her thyroid function. She also takes Buspar , which she has recently adjusted to taking 15mg  in the morning only, as she often forgets to take the evening dose.  The patient has been experiencing issues with a trigger finger, where the finger gets stuck and then has to be popped up. This has been occurring in one finger and is now starting in another. She reports significant stiffness and pain when the finger gets stuck.  The patient's diet has been affected by her husband's recent health issues, leading to a decrease in her food intake. She reports a preference for cheesecake and coffee for breakfast and usually only eats one other meal during the  day.  The patient's exercise routine has also been disrupted due to her husband's health. She used to go to the gym daily, but has not been back since her husband developed dysphagia. She currently walks about twice a week for approximately a mile and a half.  The patient's sleep pattern has changed, with her waking up more frequently than she used to. She also reports taking naps in the afternoon when her husband does. She estimates she is getting about seven hours of sleep per night.  The patient's blood pressure was slightly elevated at the time of the visit, but she reports that it is usually normal. She does not regularly monitor her blood pressure at home.  The patient is due for a mammogram in a week and a half, which she has scheduled. She has a history of breast cancer and is vigilant about her mammograms.  Caregiver strain due to spouse's Parkinson's.    Most recent fall risk assessment:    02/27/2023    8:28 AM  Fall Risk   Falls in the past year? 0  Number falls in past yr: 0  Risk for fall due to : No Fall Risks     Most recent depression screenings:    12/03/2023    1:30 PM 02/27/2023    8:28 AM  PHQ 2/9 Scores  PHQ - 2 Score 0 0  PHQ- 9 Score 3 2   Wears readers      Patient Care Team: Donzella Lauraine SAILOR, DO as PCP - General (Family  Medicine) Dannielle Arlean FALCON, RN (Inactive) as Oncology Nurse Navigator Rennie Cindy SAUNDERS, MD as Consulting Physician (Oncology) Dessa Reyes ORN, MD as Consulting Physician (General Surgery) Lenn Aran, MD as Consulting Physician (Radiation Oncology)   Outpatient Medications Prior to Visit  Medication Sig   acetaminophen  (TYLENOL ) 325 MG tablet Take 650 mg by mouth every 4 (four) hours as needed for mild pain.   anastrozole  (ARIMIDEX ) 1 MG tablet TAKE 1 TABLET BY MOUTH EVERY DAY *DISCONTINUE EXEMESTANE **   Ibuprofen 200 MG CAPS Take by mouth.   levothyroxine  (SYNTHROID ) 75 MCG tablet Take 1 tablet (75 mcg total) by mouth  daily before breakfast.   triamcinolone  cream (KENALOG ) 0.1 % APPLY TWICE A DAY TO AFFECTED AREAS (Patient taking differently: Apply 1 application  topically 2 (two) times daily as needed. APPLY TWICE A DAY TO AFFECTED AREAS)   Vitamin D , Ergocalciferol , (DRISDOL ) 1.25 MG (50000 UNIT) CAPS capsule Take 1 capsule (50,000 Units total) by mouth every 7 (seven) days.   [DISCONTINUED] busPIRone  (BUSPAR ) 10 MG tablet Take 1 tablet (10 mg total) by mouth in the morning. Take with 5 mg in evening   [DISCONTINUED] busPIRone  (BUSPAR ) 5 MG tablet Take 1 tablet (5 mg total) by mouth at bedtime.   [DISCONTINUED] clobetasol ointment (TEMOVATE) 0.05 % Apply 1 Application topically 2 (two) times daily.   [DISCONTINUED] ZORYVE 0.3 % CREA Apply 1 application  topically daily as needed (Psoriasis).   No facility-administered medications prior to visit.    Review of Systems  All other systems reviewed and are negative.     Objective:     BP (!) 143/81 (BP Location: Left Arm, Patient Position: Sitting, Cuff Size: Normal)   Pulse 81   Temp 98.1 F (36.7 C) (Oral)   Ht 5' 4 (1.626 m)   Wt 133 lb (60.3 kg)   SpO2 98%   BMI 22.83 kg/m    Physical Exam Constitutional:      General: She is not in acute distress.    Appearance: Normal appearance. She is normal weight. She is not ill-appearing.  HENT:     Head: Normocephalic.     Right Ear: Tympanic membrane, ear canal and external ear normal.     Left Ear: Tympanic membrane, ear canal and external ear normal.     Nose: Nose normal. No congestion or rhinorrhea.     Mouth/Throat:     Mouth: Mucous membranes are moist.     Pharynx: Oropharynx is clear. No oropharyngeal exudate or posterior oropharyngeal erythema.  Eyes:     Extraocular Movements: Extraocular movements intact.     Conjunctiva/sclera: Conjunctivae normal.     Pupils: Pupils are equal, round, and reactive to light.  Neck:     Thyroid: No thyroid mass, thyromegaly or thyroid tenderness.   Cardiovascular:     Rate and Rhythm: Normal rate.     Pulses: Normal pulses.     Heart sounds: Normal heart sounds. No murmur heard.    No friction rub. No gallop.  Pulmonary:     Effort: Pulmonary effort is normal. No respiratory distress.     Breath sounds: Normal breath sounds. No stridor. No wheezing, rhonchi or rales.  Abdominal:     General: Bowel sounds are normal. There is no distension.     Palpations: Abdomen is soft. There is no mass.     Tenderness: There is no abdominal tenderness. There is no guarding or rebound.     Hernia: No hernia is present.  Musculoskeletal:  General: No swelling or tenderness. Normal range of motion.     Cervical back: Normal range of motion. No rigidity. Normal range of motion.  Lymphadenopathy:     Cervical: No cervical adenopathy.  Skin:    General: Skin is warm and dry.     Capillary Refill: Capillary refill takes less than 2 seconds.     Findings: Rash present. No bruising or erythema.     Comments: Psoriasis on arms, legs, and neck. Baseline for pt.   Neurological:     General: No focal deficit present.     Mental Status: She is alert and oriented to person, place, and time. Mental status is at baseline.     Cranial Nerves: No cranial nerve deficit.     Sensory: No sensory deficit.  Psychiatric:        Mood and Affect: Mood is anxious. Affect is flat.        Behavior: Behavior normal.        Thought Content: Thought content normal.        Judgment: Judgment normal.      No results found for any visits on 12/03/23.     Assessment & Plan:    Routine Health Maintenance and Physical Exam  Health Maintenance  Topic Date Due   Pneumococcal Vaccination (1 of 2 - PCV) Never done   Zoster (Shingles) Vaccine (1 of 2) Never done   COVID-19 Vaccine (4 - 2024-25 season) 08/02/2023   Mammogram  12/16/2024   DTaP/Tdap/Td vaccine (5 - Td or Tdap) 06/09/2027   Pap with HPV screening  11/22/2027   Colon Cancer Screening  07/16/2032    Flu Shot  Completed   Hepatitis C Screening  Completed   HIV Screening  Completed   HPV Vaccine  Aged Out    Discussed health benefits of physical activity, and encouraged her to engage in regular exercise appropriate for her age and condition.  Annual physical exam Assessment & Plan: -Pt has mammogram scheduled as patient has history of breast cancer. -Advise on improving diet, including adding protein shakes given lack of appetite. -Encourage regular exercise, such as daily walks. -Encourage self-care -Advise on potential need for ophthalmology referral if vision worsens, currently wears readers.  Orders: -     Comprehensive metabolic panel  Elevated BP without diagnosis of hypertension Assessment & Plan: Historically normal BP levels, elevated today. Goal SBP<120; DBP<80.  Low sodium diet  Recommended at home monitoring weekly.  Increase weekly exercise.   Orders: -     Comprehensive metabolic panel  Social anxiety disorder Assessment & Plan: Currently on Buspar  15mg  in the morning. -Change prescription to reflect pt current usage (15mg  in the morning). - Denies use in pm. - declined psych referral for CBT today  Orders: -     busPIRone  HCl; Take 1 tablet (15 mg total) by mouth daily.  Dispense: 30 tablet; Refill: 5  Hypothyroidism, unspecified type Assessment & Plan: Increase synthroid  to in September, will recheck TSH + T4 Denies hypothyroid symptoms.  Orders: -     TSH + free T4  Avitaminosis D Assessment & Plan: Elevated Vitamin D  levels in September was told to hold supplement, has not taken since. Will recheck lab.   Prediabetes Assessment & Plan: Hx of prediabetes, will recheck A1C. Diet controlled.  Last A1c - 5.8 on 11/21/22  Orders: -     VITAMIN D  25 Hydroxy (Vit-D Deficiency, Fractures) -     Lipid panel -  Hemoglobin A1c -     Microalbumin / creatinine urine ratio  Mixed hyperlipidemia Assessment & Plan: History of elevated  triglycerides, currently on Arimidex  for breast cancer prevention which can increase triglycerides. Diet controlled.  -Order lipid panel to monitor triglycerides.   Orders: -     Lipid panel  Trigger middle finger of right hand Assessment & Plan: Concerns for trigger finger in R middle finger. States locks and gets stuck.  Reports stiffness, possibly related to carrying heavy bags for extended periods. Also from caring for husband. -Advise on stretching exercises and use of heat/ice for symptom relief. -Advise to trial aleve OTC, hold ibuprofen - trial different NSAID -offered referral to orthopedics for steroid injection, pt wants to wait until March when new insurance starts.    Need for influenza vaccination -     Flu vaccine trivalent PF, 6mos and older(Flulaval,Afluria,Fluarix,Fluzone)  Psoriasis Assessment & Plan: Continue Dermatology for Psoriasis management Continue kenalog  for treatment mgmt.       Return in about 3 months (around 03/02/2024) for chronic disease mgmt with PCP.     Curtis DELENA Boom, FNP

## 2023-12-03 NOTE — Assessment & Plan Note (Signed)
 Hx of prediabetes, will recheck A1C. Diet controlled.  Last A1c - 5.8 on 11/21/22

## 2023-12-03 NOTE — Assessment & Plan Note (Addendum)
 History of elevated triglycerides, currently on Arimidex for breast cancer prevention which can increase triglycerides. Diet controlled.  -Order lipid panel to monitor triglycerides.

## 2023-12-03 NOTE — Assessment & Plan Note (Signed)
 Concerns for trigger finger in R middle finger. States locks and gets stuck.  Reports stiffness, possibly related to carrying heavy bags for extended periods. Also from caring for husband. -Advise on stretching exercises and use of heat/ice for symptom relief. -Advise to trial aleve OTC, hold ibuprofen - trial different NSAID -offered referral to orthopedics for steroid injection, pt wants to wait until March when new insurance starts.

## 2023-12-04 ENCOUNTER — Other Ambulatory Visit: Payer: Self-pay | Admitting: Family Medicine

## 2023-12-04 DIAGNOSIS — E782 Mixed hyperlipidemia: Secondary | ICD-10-CM

## 2023-12-04 LAB — MICROALBUMIN / CREATININE URINE RATIO
Creatinine, Urine: 91.9 mg/dL
Microalb/Creat Ratio: <3 mg/g{creat} (ref 0–29)
Microalbumin, Urine: <3 ug/mL

## 2023-12-04 LAB — COMPREHENSIVE METABOLIC PANEL
ALT: 41 [IU]/L — ABNORMAL HIGH (ref 0–32)
AST: 36 [IU]/L (ref 0–40)
Albumin: 4.8 g/dL (ref 3.9–4.9)
Alkaline Phosphatase: 70 [IU]/L (ref 44–121)
BUN/Creatinine Ratio: 16 (ref 12–28)
BUN: 13 mg/dL (ref 8–27)
Bilirubin Total: 0.4 mg/dL (ref 0.0–1.2)
CO2: 23 mmol/L (ref 20–29)
Calcium: 9.7 mg/dL (ref 8.7–10.3)
Chloride: 102 mmol/L (ref 96–106)
Creatinine, Ser: 0.82 mg/dL (ref 0.57–1.00)
Globulin, Total: 2.5 g/dL (ref 1.5–4.5)
Glucose: 98 mg/dL (ref 70–99)
Potassium: 4.6 mmol/L (ref 3.5–5.2)
Sodium: 141 mmol/L (ref 134–144)
Total Protein: 7.3 g/dL (ref 6.0–8.5)
eGFR: 80 mL/min/{1.73_m2} (ref >59)

## 2023-12-04 LAB — HEMOGLOBIN A1C
Est. average glucose Bld gHb Est-mCnc: 117 mg/dL
Hgb A1c MFr Bld: 5.7 % — ABNORMAL HIGH (ref 4.8–5.6)

## 2023-12-04 LAB — TSH+FREE T4
Free T4: 1.38 ng/dL (ref 0.82–1.77)
TSH: 2.63 u[IU]/mL (ref 0.450–4.500)

## 2023-12-04 LAB — LIPID PANEL
Chol/HDL Ratio: 3.2 {ratio} (ref 0.0–4.4)
Cholesterol, Total: 291 mg/dL — ABNORMAL HIGH (ref 100–199)
HDL: 92 mg/dL (ref >39)
LDL Chol Calc (NIH): 171 mg/dL — ABNORMAL HIGH (ref 0–99)
Triglycerides: 159 mg/dL — ABNORMAL HIGH (ref 0–149)
VLDL Cholesterol Cal: 28 mg/dL (ref 5–40)

## 2023-12-04 LAB — VITAMIN D 25 HYDROXY (VIT D DEFICIENCY, FRACTURES): Vit D, 25-Hydroxy: 45.6 ng/mL (ref 30.0–100.0)

## 2023-12-14 ENCOUNTER — Encounter: Payer: Self-pay | Admitting: Internal Medicine

## 2023-12-18 ENCOUNTER — Ambulatory Visit
Admission: RE | Admit: 2023-12-18 | Discharge: 2023-12-18 | Disposition: A | Discharge status: Home / Self Care | Payer: 59 | Source: Ambulatory Visit | Attending: Nurse Practitioner | Admitting: Nurse Practitioner

## 2023-12-18 ENCOUNTER — Other Ambulatory Visit: Payer: Self-pay | Admitting: Nurse Practitioner

## 2023-12-18 ENCOUNTER — Other Ambulatory Visit: Payer: Self-pay | Admitting: Surgery

## 2023-12-18 DIAGNOSIS — Z08 Encounter for follow-up examination after completed treatment for malignant neoplasm: Secondary | ICD-10-CM

## 2023-12-18 DIAGNOSIS — Z853 Personal history of malignant neoplasm of breast: Secondary | ICD-10-CM | POA: Insufficient documentation

## 2023-12-29 ENCOUNTER — Other Ambulatory Visit: Payer: Self-pay | Admitting: Family Medicine

## 2023-12-29 DIAGNOSIS — F4010 Social phobia, unspecified: Secondary | ICD-10-CM

## 2024-01-05 ENCOUNTER — Encounter: Payer: Self-pay | Admitting: Internal Medicine

## 2024-01-12 ENCOUNTER — Encounter: Payer: Self-pay | Admitting: Nurse Practitioner

## 2024-01-31 ENCOUNTER — Other Ambulatory Visit: Payer: Self-pay | Admitting: Family Medicine

## 2024-01-31 DIAGNOSIS — E559 Vitamin D deficiency, unspecified: Secondary | ICD-10-CM

## 2024-02-02 NOTE — Telephone Encounter (Signed)
 Requested medication (s) are due for refill today: yes  Requested medication (s) are on the active medication list: yes  Last refill:  08/11/23  Future visit scheduled: yes  Notes to clinic:   Manual Review: Route requests for 50,000 IU strength to the provider      Requested Prescriptions  Pending Prescriptions Disp Refills   Vitamin D, Ergocalciferol, (DRISDOL) 1.25 MG (50000 UNIT) CAPS capsule [Pharmacy Med Name: VITAMIN D2 1.25MG (50,000 UNIT)] 12 capsule 1    Sig: Take 1 capsule (50,000 Units total) by mouth every 7 (seven) days.     Endocrinology:  Vitamins - Vitamin D Supplementation 2 Failed - 02/02/2024 10:26 AM      Failed - Manual Review: Route requests for 50,000 IU strength to the provider      Passed - Ca in normal range and within 360 days    Calcium  Date Value Ref Range Status  12/03/2023 9.7 8.7 - 10.3 mg/dL Final         Passed - Vitamin D in normal range and within 360 days    Vit D, 25-Hydroxy  Date Value Ref Range Status  12/03/2023 45.6 30.0 - 100.0 ng/mL Final    Comment:    Vitamin D deficiency has been defined by the Institute of Medicine and an Endocrine Society practice guideline as a level of serum 25-OH vitamin D less than 20 ng/mL (1,2). The Endocrine Society went on to further define vitamin D insufficiency as a level between 21 and 29 ng/mL (2). 1. IOM (Institute of Medicine). 2010. Dietary reference    intakes for calcium and D. [place]: The    [employer]. 2. Holick MF, Binkley [place], Bischoff-Ferrari HA, et al.    Evaluation, treatment, and prevention of vitamin D    deficiency: an Endocrine Society clinical practice    guideline. JCEM. 2011 Jul; 96(7):1911-30.          Passed - Valid encounter within last 12 months    Recent Outpatient Visits           2 months ago Annual physical exam   [place] [facility] [place], Jennette Kettle, FNP   5 months ago Chronic fatigue   [place] [facility] [place], Marzella Schlein, MD   11 months ago Hyperlipidemia, unspecified hyperlipidemia type   [facility] Alfredia Ferguson, PA-C   1 year ago Social anxiety disorder   [facility] Alfredia Ferguson, PA-C   1 year ago Encounter for annual physical exam   [facility] Alfredia Ferguson, PA-C       Future Appointments             In 3 months Ephriam Knuckles, Jennette Kettle, FNP [facility], [facility]

## 2024-02-06 ENCOUNTER — Other Ambulatory Visit: Payer: Self-pay | Admitting: Family Medicine

## 2024-02-06 DIAGNOSIS — F4010 Social phobia, unspecified: Secondary | ICD-10-CM

## 2024-02-06 DIAGNOSIS — E034 Atrophy of thyroid (acquired): Secondary | ICD-10-CM

## 2024-02-08 NOTE — Telephone Encounter (Signed)
 Requested Prescriptions  Pending Prescriptions Disp Refills   busPIRone (BUSPAR) 10 MG tablet [Pharmacy Med Name: BUSPIRONE HCL 10 MG TABLET] 90 tablet 1    Sig: TAKE 1 TABLET (10 MG TOTAL) BY MOUTH IN THE MORNING. TAKE WITH 5 MG IN EVENING     Psychiatry: Anxiolytics/Hypnotics - Non-controlled Passed - 02/08/2024  1:34 PM      Passed - Valid encounter within last 12 months    Recent Outpatient Visits           2 months ago Annual physical exam   [place] [facility] [place], Jennette Kettle, FNP   6 months ago Chronic fatigue   [place] [facility] Erasmo Downer, MD   11 months ago Hyperlipidemia, unspecified hyperlipidemia type   [place] [facility] Alfredia Ferguson, PA-C   1 year ago Social anxiety disorder   [facility] Alfredia Ferguson, PA-C   1 year ago Encounter for annual physical exam   [facility] Alfredia Ferguson, PA-C       Future Appointments             In 2 months Ephriam Knuckles, Jennette Kettle, FNP [facility]             levothyroxine (SYNTHROID) 75 MCG tablet [Pharmacy Med Name: LEVOTHYROXINE 75 MCG TABLET] 90 tablet 1    Sig: TAKE 1 TABLET BY MOUTH DAILY BEFORE BREAKFAST.     Endocrinology:  Hypothyroid Agents Passed - 02/08/2024  1:34 PM      Passed - TSH in normal range and within 360 days    TSH  Date Value Ref Range Status  12/03/2023 2.630 0.450 - 4.500 uIU/mL Final         Passed - Valid encounter within last 12 months    Recent Outpatient Visits           2 months ago Annual physical exam   [place] [facility] [place], Jennette Kettle, FNP   6 months ago Chronic fatigue    [facility] Erasmo Downer, MD   11 months ago Hyperlipidemia, unspecified hyperlipidemia type   [facility] Alfredia Ferguson, PA-C   1 year ago Social anxiety disorder   [facility] Alfredia Ferguson, PA-C   1 year ago Encounter for annual physical exam   [facility] Alfredia Ferguson, PA-C       Future Appointments             In 2 months Ephriam Knuckles, Jennette Kettle, FNP [facility], [facility]

## 2024-02-08 NOTE — Telephone Encounter (Signed)
 D/C 12/03/23. Requested Prescriptions  Signed Prescriptions Disp Refills   levothyroxine (SYNTHROID) 75 MCG tablet 90 tablet 1    Sig: TAKE 1 TABLET BY MOUTH DAILY BEFORE BREAKFAST.     Endocrinology:  Hypothyroid Agents Passed - 02/08/2024  1:37 PM      Passed - TSH in normal range and within 360 days    TSH  Date Value Ref Range Status  12/03/2023 2.630 0.450 - 4.500 uIU/mL Final         Passed - Valid encounter within last 12 months    Recent Outpatient Visits           2 months ago Annual physical exam   [place] [facility] [place], Jennette Kettle, FNP   6 months ago Chronic fatigue   [place] [facility] [place], Marzella Schlein, MD   11 months ago Hyperlipidemia, unspecified hyperlipidemia type   [facility] Alfredia Ferguson, PA-C   1 year ago Social anxiety disorder   [place] [facility] Alfredia Ferguson, PA-C   1 year ago Encounter for annual physical exam   [facility] Alfredia Ferguson, PA-C       Future Appointments             In 2 months Ephriam Knuckles, Jennette Kettle, FNP [place] [facility]            Refused Prescriptions Disp Refills   busPIRone (BUSPAR) 10 MG tablet [Pharmacy Med Name: BUSPIRONE HCL 10 MG TABLET] 90 tablet 1    Sig: TAKE 1 TABLET (10 MG TOTAL) BY MOUTH IN THE MORNING. TAKE WITH 5 MG IN EVENING     Psychiatry: Anxiolytics/Hypnotics - Non-controlled Passed - 02/08/2024  1:37 PM      Passed - Valid encounter within last 12 months    Recent Outpatient Visits           2 months ago Annual physical exam   [place] [facility] [place], Jennette Kettle, FNP   6 months ago Chronic fatigue   [place] [facility] Erasmo Downer, MD   11 months ago Hyperlipidemia, unspecified hyperlipidemia type   [facility] Alfredia Ferguson, PA-C   1 year ago Social anxiety disorder   [facility] Alfredia Ferguson, PA-C   1 year ago Encounter for annual physical exam   [facility] Alfredia Ferguson, PA-C       Future Appointments             In 2 months Ephriam Knuckles, Jennette Kettle, FNP [facility], [facility]

## 2024-03-08 ENCOUNTER — Encounter: Payer: Self-pay | Admitting: Internal Medicine

## 2024-03-15 ENCOUNTER — Inpatient Hospital Stay (HOSPITAL_BASED_OUTPATIENT_CLINIC_OR_DEPARTMENT_OTHER): Payer: BC Managed Care – PPO | Admitting: Internal Medicine

## 2024-03-15 ENCOUNTER — Encounter: Payer: Self-pay | Admitting: Internal Medicine

## 2024-03-15 ENCOUNTER — Inpatient Hospital Stay: Payer: BC Managed Care – PPO | Attending: Internal Medicine

## 2024-03-15 DIAGNOSIS — M85852 Other specified disorders of bone density and structure, left thigh: Secondary | ICD-10-CM | POA: Insufficient documentation

## 2024-03-15 DIAGNOSIS — Z170 Estrogen receptor positive status [ER+]: Secondary | ICD-10-CM | POA: Diagnosis not present

## 2024-03-15 DIAGNOSIS — Z1721 Progesterone receptor positive status: Secondary | ICD-10-CM | POA: Insufficient documentation

## 2024-03-15 DIAGNOSIS — Z923 Personal history of irradiation: Secondary | ICD-10-CM | POA: Diagnosis not present

## 2024-03-15 DIAGNOSIS — Z1732 Human epidermal growth factor receptor 2 negative status: Secondary | ICD-10-CM | POA: Insufficient documentation

## 2024-03-15 DIAGNOSIS — R5383 Other fatigue: Secondary | ICD-10-CM

## 2024-03-15 DIAGNOSIS — Z79811 Long term (current) use of aromatase inhibitors: Secondary | ICD-10-CM | POA: Diagnosis not present

## 2024-03-15 DIAGNOSIS — E559 Vitamin D deficiency, unspecified: Secondary | ICD-10-CM

## 2024-03-15 DIAGNOSIS — C50812 Malignant neoplasm of overlapping sites of left female breast: Secondary | ICD-10-CM | POA: Diagnosis not present

## 2024-03-15 DIAGNOSIS — Z08 Encounter for follow-up examination after completed treatment for malignant neoplasm: Secondary | ICD-10-CM

## 2024-03-15 DIAGNOSIS — Z79899 Other long term (current) drug therapy: Secondary | ICD-10-CM | POA: Insufficient documentation

## 2024-03-15 LAB — CBC WITH DIFFERENTIAL (CANCER CENTER ONLY)
Abs Immature Granulocytes: 0 10*3/uL (ref 0.00–0.07)
Basophils Absolute: 0 10*3/uL (ref 0.0–0.1)
Basophils Relative: 1 %
Eosinophils Absolute: 0.1 10*3/uL (ref 0.0–0.5)
Eosinophils Relative: 3 %
HCT: 39.7 % (ref 36.0–46.0)
Hemoglobin: 13.6 g/dL (ref 12.0–15.0)
Immature Granulocytes: 0 %
Lymphocytes Relative: 38 %
Lymphs Abs: 1.3 10*3/uL (ref 0.7–4.0)
MCH: 33.3 pg (ref 26.0–34.0)
MCHC: 34.3 g/dL (ref 30.0–36.0)
MCV: 97.3 fL (ref 80.0–100.0)
Monocytes Absolute: 0.4 10*3/uL (ref 0.1–1.0)
Monocytes Relative: 11 %
Neutro Abs: 1.6 10*3/uL — ABNORMAL LOW (ref 1.7–7.7)
Neutrophils Relative %: 47 %
Platelet Count: 282 10*3/uL (ref 150–400)
RBC: 4.08 MIL/uL (ref 3.87–5.11)
RDW: 12.7 % (ref 11.5–15.5)
WBC Count: 3.5 10*3/uL — ABNORMAL LOW (ref 4.0–10.5)
nRBC: 0 % (ref 0.0–0.2)

## 2024-03-15 LAB — VITAMIN D 25 HYDROXY (VIT D DEFICIENCY, FRACTURES): Vit D, 25-Hydroxy: 74.84 ng/mL (ref 30–100)

## 2024-03-15 LAB — CMP (CANCER CENTER ONLY)
ALT: 61 U/L — ABNORMAL HIGH (ref 0–44)
AST: 54 U/L — ABNORMAL HIGH (ref 15–41)
Albumin: 4.2 g/dL (ref 3.5–5.0)
Alkaline Phosphatase: 56 U/L (ref 38–126)
Anion gap: 11 (ref 5–15)
BUN: 13 mg/dL (ref 8–23)
CO2: 22 mmol/L (ref 22–32)
Calcium: 9 mg/dL (ref 8.9–10.3)
Chloride: 106 mmol/L (ref 98–111)
Creatinine: 0.64 mg/dL (ref 0.44–1.00)
GFR, Estimated: >60 mL/min (ref >60)
Glucose, Bld: 108 mg/dL — ABNORMAL HIGH (ref 70–99)
Potassium: 4.1 mmol/L (ref 3.5–5.1)
Sodium: 139 mmol/L (ref 135–145)
Total Bilirubin: 0.6 mg/dL (ref 0.0–1.2)
Total Protein: 7.5 g/dL (ref 6.5–8.1)

## 2024-03-15 MED ORDER — ANASTROZOLE 1 MG PO TABS: 1.0000 mg | ORAL_TABLET | Freq: Every day | ORAL | 1 refills | Status: DC

## 2024-03-15 NOTE — Progress Notes (Signed)
 [facility] CONSULT NOTE  Patient Care Team: Pardue, Monico Blitz, DO as PCP - General (Family Medicine) Scarlett Presto, RN (Inactive) as Oncology Nurse Navigator Earna Coder, MD as Consulting Physician (Oncology) Lemar Livings, Merrily Pew, MD as Consulting Physician (General Surgery) Carmina Miller, MD as Consulting Physician (Radiation Oncology)  CHIEF COMPLAINTS/PURPOSE OF CONSULTATION: Breast cancer    Oncology History Overview Note  #Invasive lobular breast cancere ER;PR Positive; her 2 Negative; stage IA. mT [56mm]; pN0; s/p lumpectomy; Oncotype -;   # IMPRESSION: 1. A 6 mm mass in the LEFT inner breast at 9 o'clock 1 cm from the nipple with associated architectural distortion is indeterminate. Recommend ultrasound-guided biopsy for definitive characterization with attention on post marker placement mammogram to assess for mammographic/sonographic correlation. 2. No suspicious LEFT axillary adenopathy.  DIAGNOSIS:  A.  BREAST, LEFT 9:00 1 CM FN; ULTRASOUND-GUIDED BIOPSY:  - INVASIVE MAMMARY CARCINOMA WITH LOBULAR FEATURES.  - LOBULAR NEOPLASIA.   Size of invasive carcinoma: 8 mm in this sample  Histologic grade of invasive carcinoma: Grade 2                       Glandular/tubular differentiation score: 3                       Nuclear pleomorphism score: 2                       Mitotic rate score: 1                       Total score: 6  Ductal carcinoma in situ: Not identified  Lymphovascular invasion: Not identified   ER/PR/HER2: Immunohistochemistry will be performed on block A1, with  reflex to FISH for HER2 2+. The results will be reported in an addendum.   Comment:  The definitive grade will be assigned on the excisional specimen.   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  Oncotype low risk. MAY 12th, 2023- Anastrazole.[Stopped sec to MSK]  # MID JUNE 2023- LETROZOLE [sec to MSK  stopped April 2024]; MAY 2024- examestane- STOPPED JUNE 2024.  # JUNE 2024- Re-started back on Anastrazole    Carcinoma of overlapping sites of left breast in female, estrogen receptor positive (HCC)  02/07/2022 Initial Diagnosis   Carcinoma of overlapping sites of left breast in female, estrogen receptor positive (HCC)   02/07/2022 Cancer Staging   Staging form: Breast, AJCC 8th Edition - Pathologic: Stage IA (pT1c, pN0, cM0, G2, ER+, PR+, HER2-) - Signed by Earna Coder, MD on 02/07/2022 Histologic grading system: 3 grade system    Genetic Testing   Negative genetic testing. No pathogenic variants identified on the [facility] CancerNext-Expanded+RNA panel. The report date is 02/21/2022.  The CancerNext-Expanded + RNAinsight gene panel offered by [employer] and includes sequencing and rearrangement analysis for the following 77 genes: IP, ALK, APC*, ATM*, AXIN2, BAP1, BARD1, BLM, BMPR1A, BRCA1*, BRCA2*, BRIP1*, CDC73, CDH1*,CDK4, CDKN1B, CDKN2A, CHEK2*, CTNNA1, DICER1, FANCC, FH, FLCN, GALNT12, KIF1B, LZTR1, MAX, MEN1, MET, MLH1*, MSH2*, MSH3, MSH6*, MUTYH*, NBN, NF1*, NF2, NTHL1, PALB2*, PHOX2B, PMS2*, POT1, PRKAR1A, PTCH1, PTEN*, RAD51C*, RAD51D*,RB1, RECQL, RET, SDHA, SDHAF2, SDHB, SDHC, SDHD, SMAD4, SMARCA4, SMARCB1, SMARCE1, STK11, SUFU, TMEM127, TP53*,TSC1, TSC2, VHL and XRCC2 (sequencing and deletion/duplication); EGFR, EGLN1, HOXB13, KIT, MITF, PDGFRA, POLD1 and POLE (sequencing only); EPCAM and GREM1 (deletion/duplication only).  HISTORY OF PRESENTING ILLNESS: Patient ambulating independently.  Alone.  Tricia Rivera 63 y.o.  female with a history of stage I ER/PR breast cancer currently on adjuvant AI; and annual reclast is here for follow-up and review the mammogram.   Patient has no complaints. . 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    Otherwise, patient is doing well on the taking anastrazole  Patient denies any dental pain.   Otherwise no worsening joint pains or back pain.   Review of Systems  Constitutional:  Negative for chills, diaphoresis, fever, malaise/fatigue and weight loss.  HENT:  Negative for nosebleeds and sore throat.   Eyes:  Negative for double vision.  Respiratory:  Negative for cough, hemoptysis, sputum production, shortness of breath and wheezing.   Cardiovascular:  Negative for chest pain, palpitations, orthopnea and leg swelling.  Gastrointestinal:  Negative for abdominal pain, blood in stool, constipation, diarrhea, heartburn, melena, nausea and vomiting.  Genitourinary:  Negative for dysuria, frequency and urgency.  Musculoskeletal:  Positive for back pain and joint pain.  Skin: Negative.  Negative for itching and rash.  Neurological:  Negative for dizziness, tingling, focal weakness, weakness and headaches.  Endo/Heme/Allergies:  Does not bruise/bleed easily.  Psychiatric/Behavioral:  Negative for depression. The patient is not nervous/anxious and does not have insomnia.      MEDICAL HISTORY:  Past Medical History:  Diagnosis Date   ADD (attention deficit disorder)    Anxiety    Breast cancer (HCC) 01/2022   Depression    Family history of breast cancer    Family history of liver cancer    Family history of pancreatic cancer    Hyperlipidemia    Malignant neoplasm of left breast (HCC)    Personal history of radiation therapy    Psoriasis    Vitamin D deficiency     SURGICAL HISTORY: Past Surgical History:  Procedure Laterality Date   BREAST BIOPSY Left 01/08/2022   [place] bs, venus marker, invasive mammary   BREAST LUMPECTOMY Left 01/24/2022   BREAST LUMPECTOMY WITH SENTINEL LYMPH NODE BIOPSY Left 01/24/2022   Procedure: BREAST LUMPECTOMY WITH SENTINEL AXIILLARY LYMPH NODE BX;  Surgeon: Earline Mayotte, MD;  Location: [facility] ORS;  Service: General;  Laterality: Left;   BUNIONECTOMY  12/01/2008   BUNIONECTOMY N/A    2015   COLONOSCOPY     COLONOSCOPY WITH PROPOFOL N/A  07/16/2022   Procedure: COLONOSCOPY WITH PROPOFOL;  Surgeon: Earline Mayotte, MD;  Location: [facility] ENDOSCOPY;  Service: Endoscopy;  Laterality: N/A;    SOCIAL HISTORY: Social History   Socioeconomic History   Marital status: Married    Spouse name: Not on file   Number of children: Not on file   Years of education: Not on file   Highest education level: Bachelor's degree (e.g., BA, AB, BS)  Occupational History   Not on file  Tobacco Use   Smoking status: Never   Smokeless tobacco: Never  Vaping Use   Vaping status: Never Used  Substance and Sexual Activity   Alcohol use: Not Currently    Alcohol/week: 3.0 standard drinks of alcohol    Types: 3 Glasses of wine per week   Drug use: No   Sexual activity: Not Currently  Other Topics Concern   Not on file  Social History Narrative   Lives in [place]; with husband; son' grandson. Daughter- in [place]. 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    Social Drivers of Health  Financial Resource Strain: Low Risk  (11/26/2023)   Overall Financial Resource Strain (CARDIA)    Difficulty of Paying Living Expenses: Not hard at all  Food Insecurity: No Food Insecurity (11/26/2023)   Hunger Vital Sign    Worried About Running Out of Food in the Last Year: Never true    Ran Out of Food in the Last Year: Never true  Transportation Needs: No Transportation Needs (11/26/2023)   PRAPARE - Administrator, Civil Service (Medical): No    Lack of Transportation (Non-Medical): No  Physical Activity: Insufficiently Active (11/26/2023)   Exercise Vital Sign    Days of Exercise per Week: 3 days    Minutes of Exercise per Session: 20 min  Stress: Stress Concern Present (11/26/2023)   [employer] of Occupational Health - Occupational Stress Questionnaire    Feeling of Stress : To some extent  Social Connections: Unknown (11/26/2023)   Social Connection and Isolation Panel [NHANES]    Frequency of  Communication with Friends and Family: Three times a week    Frequency of Social Gatherings with Friends and Family: Once a week    Attends Religious Services: Patient declined    Database administrator or Organizations: No    Attends Engineer, structural: Not on file    Marital Status: Married  Catering manager Violence: Not on file    FAMILY HISTORY: Family History  Problem Relation Age of Onset   Pancreatic cancer Mother    Liver cancer Father    Breast cancer Maternal Aunt    Liver cancer Maternal Grandfather     ALLERGIES:  has no known allergies.  MEDICATIONS:  Current Outpatient Medications  Medication Sig Dispense Refill   acetaminophen (TYLENOL) 325 MG tablet Take 650 mg by mouth every 4 (four) hours as needed for mild pain.     busPIRone (BUSPAR) 15 MG tablet TAKE 1 TABLET (15 MG TOTAL) BY MOUTH DAILY. 90 tablet 2   hydrOXYzine (ATARAX) 25 MG tablet Take 25 mg by mouth at bedtime.     Ibuprofen 200 MG CAPS Take by mouth.     levothyroxine (SYNTHROID) 75 MCG tablet TAKE 1 TABLET BY MOUTH DAILY BEFORE BREAKFAST. 90 tablet 1   triamcinolone cream (KENALOG) 0.1 % APPLY TWICE A DAY TO AFFECTED AREAS (Patient taking differently: Apply 1 application  topically 2 (two) times daily as needed. APPLY TWICE A DAY TO AFFECTED AREAS) 30 g 6   Vitamin D, Ergocalciferol, (DRISDOL) 1.25 MG (50000 UNIT) CAPS capsule TAKE 1 CAPSULE (50,000 UNITS TOTAL) BY MOUTH EVERY 7 (SEVEN) DAYS 12 capsule 0   anastrozole (ARIMIDEX) 1 MG tablet Take 1 tablet (1 mg total) by mouth daily. 90 tablet 1   No current facility-administered medications for this visit.      Aaron Aas  PHYSICAL EXAMINATION: ECOG PERFORMANCE STATUS: 0 - Asymptomatic  There were no vitals filed for this visit.  There were no vitals filed for this visit.   Physical Exam Vitals and nursing note reviewed.  HENT:     Head: Normocephalic and atraumatic.     Mouth/Throat:     Pharynx: Oropharynx is clear.  Eyes:      Extraocular Movements: Extraocular movements intact.     Pupils: Pupils are equal, round, and reactive to light.  Cardiovascular:     Rate and Rhythm: Normal rate and regular rhythm.  Pulmonary:     Comments: Decreased breath sounds bilaterally.  Abdominal:     Palpations: Abdomen is soft.  Musculoskeletal:        General: Normal range of motion.     Cervical back: Normal range of motion.  Skin:    General: Skin is warm.  Neurological:     General: No focal deficit present.     Mental Status: She is alert and oriented to person, place, and time.  Psychiatric:        Behavior: Behavior normal.        Judgment: Judgment normal.     LABORATORY DATA:  I have reviewed the data as listed Lab Results  Component Value Date   WBC 3.5 (L) 03/15/2024   HGB 13.6 03/15/2024   HCT 39.7 03/15/2024   MCV 97.3 03/15/2024   PLT 282 03/15/2024   Recent Labs    09/15/23 0908 12/03/23 1414 03/15/24 1009  NA 138 141 139  K 4.2 4.6 4.1  CL 103 102 106  CO2 26 23 22   GLUCOSE 111* 98 108*  BUN 15 13 13   CREATININE 0.75 0.82 0.64  CALCIUM 9.3 9.7 9.0  GFRNONAA >60  --  >60  PROT 7.8 7.3 7.5  ALBUMIN 4.4 4.8 4.2  AST 37 36 54*  ALT 38 41* 61*  ALKPHOS 60 70 56  BILITOT 0.6 0.4 0.6    RADIOGRAPHIC STUDIES: I have personally reviewed the radiological images as listed and agreed with the findings in the report. No results found.  ASSESSMENT & PLAN:   Carcinoma of overlapping sites of left breast in female, estrogen receptor positive (HCC) # MARCH 2023- invasive lobular breast cancer ER;PR Positive; her 2 Negative; stage IA; low risk Oncotype.  No benefit from chemotherapy.  ] [Dr.Byrnet/Dr.Sakai- JAN 2024- Bil Mammo- WNL.   # Discussed regarding surveillance MRI given dense breast and also history of lobular cancer.  Patient is concerned about insurance.  She will check with insurance to get back to us  regarding the cost.  # Continue anastrozole until spring 2028 [total of 5  years]  # MSK- G-1-currently improved on anastrozole.  #Osteopenia: [June 2023]- The BMD measured at Femur Neck Left  T-score of -2.1. ON  Reclast yearly.  Plan BMD in 2026-JAN.  Will order at next visit.  # Slightly elevated- AST- ?  Related to fatty liver versus others- stable.  # DISPOSITION:  # follow up in 6 months MD: labs- cbc/cmp; Vit D 25 OH levels; reclast - Dr.B   All questions were answered. The patient/family knows to call the clinic with any problems, questions or concerns.    Gwyn Leos, MD 03/15/2024 11:52 AM

## 2024-03-15 NOTE — Assessment & Plan Note (Addendum)
#   MARCH 2023- invasive lobular breast cancer ER;PR Positive; her 2 Negative; stage IA; low risk Oncotype.  No benefit from chemotherapy.  ] [Dr.Byrnet/Dr.Sakai- JAN 2024- Bil Mammo- WNL.   # Discussed regarding surveillance MRI given dense breast and also history of lobular cancer.  Patient is concerned about insurance.  She will check with insurance to get back to us  regarding the cost.  # Continue anastrozole until spring 2028 [total of 5 years]  # MSK- G-1-currently improved on anastrozole.  #Osteopenia: [June 2023]- The BMD measured at Femur Neck Left  T-score of -2.1. ON  Reclast yearly.  Plan BMD in 2026-JAN.  Will order at next visit.  # Slightly elevated- AST- ?  Related to fatty liver versus others- stable.  # DISPOSITION:  # follow up in 6 months MD: labs- cbc/cmp; Vit D 25 OH levels; reclast - Dr.B

## 2024-03-15 NOTE — Progress Notes (Signed)
 Pt has no complaints. Taking anastrazole. Denies side effects. 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

## 2024-05-02 ENCOUNTER — Ambulatory Visit: Payer: Self-pay | Admitting: Family Medicine

## 2024-05-11 ENCOUNTER — Ambulatory Visit: Admitting: Family Medicine

## 2024-08-12 ENCOUNTER — Telehealth: Payer: Self-pay | Admitting: Internal Medicine

## 2024-08-12 NOTE — Telephone Encounter (Signed)
 Called patient to reschedule appointment dur to provider being out of office. No answer and voicemail full. Mailed patient a letter asking them to call to reschedule

## 2024-09-06 ENCOUNTER — Ambulatory Visit

## 2024-09-06 DIAGNOSIS — Z23 Encounter for immunization: Secondary | ICD-10-CM

## 2024-09-06 NOTE — Progress Notes (Deleted)
 Patient is present to receive seasonal high dose flu vaccine. Patient received immunization in right deltoid, tolerated injection well.

## 2024-09-06 NOTE — Progress Notes (Unsigned)
 Patient is present to receive seasonal high ose influenza vaccine. Patient was given injection in right deltoid and tolerated well.

## 2024-09-14 ENCOUNTER — Ambulatory Visit: Admitting: Internal Medicine

## 2024-09-14 ENCOUNTER — Ambulatory Visit

## 2024-09-14 ENCOUNTER — Other Ambulatory Visit

## 2024-09-15 ENCOUNTER — Ambulatory Visit: Admitting: Nurse Practitioner

## 2024-09-15 ENCOUNTER — Other Ambulatory Visit

## 2024-09-15 ENCOUNTER — Ambulatory Visit

## 2024-09-21 ENCOUNTER — Other Ambulatory Visit: Payer: Self-pay | Admitting: Family Medicine

## 2024-09-21 DIAGNOSIS — F4010 Social phobia, unspecified: Secondary | ICD-10-CM

## 2024-09-29 ENCOUNTER — Inpatient Hospital Stay

## 2024-09-29 ENCOUNTER — Inpatient Hospital Stay: Attending: Internal Medicine

## 2024-09-29 ENCOUNTER — Encounter: Payer: Self-pay | Admitting: Internal Medicine

## 2024-09-29 ENCOUNTER — Encounter: Payer: Self-pay | Admitting: Nurse Practitioner

## 2024-09-29 ENCOUNTER — Inpatient Hospital Stay: Admitting: Nurse Practitioner

## 2024-09-29 VITALS — BP 137/83 | HR 76 | Temp 96.9°F | Resp 18 | Ht 64.0 in | Wt 130.7 lb

## 2024-09-29 DIAGNOSIS — Z79899 Other long term (current) drug therapy: Secondary | ICD-10-CM | POA: Insufficient documentation

## 2024-09-29 DIAGNOSIS — C50812 Malignant neoplasm of overlapping sites of left female breast: Secondary | ICD-10-CM | POA: Diagnosis not present

## 2024-09-29 DIAGNOSIS — Z79811 Long term (current) use of aromatase inhibitors: Secondary | ICD-10-CM | POA: Insufficient documentation

## 2024-09-29 DIAGNOSIS — Z1721 Progesterone receptor positive status: Secondary | ICD-10-CM | POA: Diagnosis not present

## 2024-09-29 DIAGNOSIS — Z17411 Hormone receptor positive with human epidermal growth factor receptor 2 negative status: Secondary | ICD-10-CM | POA: Insufficient documentation

## 2024-09-29 DIAGNOSIS — Z7983 Long term (current) use of bisphosphonates: Secondary | ICD-10-CM | POA: Diagnosis not present

## 2024-09-29 DIAGNOSIS — M8580 Other specified disorders of bone density and structure, unspecified site: Secondary | ICD-10-CM

## 2024-09-29 DIAGNOSIS — Z923 Personal history of irradiation: Secondary | ICD-10-CM | POA: Insufficient documentation

## 2024-09-29 DIAGNOSIS — Z803 Family history of malignant neoplasm of breast: Secondary | ICD-10-CM | POA: Insufficient documentation

## 2024-09-29 DIAGNOSIS — Z170 Estrogen receptor positive status [ER+]: Secondary | ICD-10-CM | POA: Diagnosis not present

## 2024-09-29 DIAGNOSIS — M85852 Other specified disorders of bone density and structure, left thigh: Secondary | ICD-10-CM | POA: Insufficient documentation

## 2024-09-29 DIAGNOSIS — Z800 Family history of malignant neoplasm of digestive organs: Secondary | ICD-10-CM | POA: Diagnosis not present

## 2024-09-29 DIAGNOSIS — E559 Vitamin D deficiency, unspecified: Secondary | ICD-10-CM

## 2024-09-29 LAB — CBC WITH DIFFERENTIAL (CANCER CENTER ONLY)
Abs Immature Granulocytes: 0.01 K/uL (ref 0.00–0.07)
Basophils Absolute: 0 K/uL (ref 0.0–0.1)
Basophils Relative: 1 %
Eosinophils Absolute: 0.1 K/uL (ref 0.0–0.5)
Eosinophils Relative: 3 %
HCT: 41.7 % (ref 36.0–46.0)
Hemoglobin: 14.2 g/dL (ref 12.0–15.0)
Immature Granulocytes: 0 %
Lymphocytes Relative: 42 %
Lymphs Abs: 1.6 K/uL (ref 0.7–4.0)
MCH: 33.3 pg (ref 26.0–34.0)
MCHC: 34.1 g/dL (ref 30.0–36.0)
MCV: 97.7 fL (ref 80.0–100.0)
Monocytes Absolute: 0.4 K/uL (ref 0.1–1.0)
Monocytes Relative: 12 %
Neutro Abs: 1.6 K/uL — ABNORMAL LOW (ref 1.7–7.7)
Neutrophils Relative %: 42 %
Platelet Count: 273 K/uL (ref 150–400)
RBC: 4.27 MIL/uL (ref 3.87–5.11)
RDW: 13.2 % (ref 11.5–15.5)
WBC Count: 3.7 K/uL — ABNORMAL LOW (ref 4.0–10.5)
nRBC: 0 % (ref 0.0–0.2)

## 2024-09-29 LAB — CMP (CANCER CENTER ONLY)
ALT: 42 U/L (ref 0–44)
AST: 36 U/L (ref 15–41)
Albumin: 4.4 g/dL (ref 3.5–5.0)
Alkaline Phosphatase: 53 U/L (ref 38–126)
Anion gap: 11 (ref 5–15)
BUN: 15 mg/dL (ref 8–23)
CO2: 24 mmol/L (ref 22–32)
Calcium: 9.2 mg/dL (ref 8.9–10.3)
Chloride: 101 mmol/L (ref 98–111)
Creatinine: 0.8 mg/dL (ref 0.44–1.00)
GFR, Estimated: >60 mL/min (ref >60)
Glucose, Bld: 116 mg/dL — ABNORMAL HIGH (ref 70–99)
Potassium: 4.1 mmol/L (ref 3.5–5.1)
Sodium: 136 mmol/L (ref 135–145)
Total Bilirubin: 0.7 mg/dL (ref 0.0–1.2)
Total Protein: 7.7 g/dL (ref 6.5–8.1)

## 2024-09-29 LAB — VITAMIN D 25 HYDROXY (VIT D DEFICIENCY, FRACTURES): Vit D, 25-Hydroxy: 62.37 ng/mL (ref 30–100)

## 2024-09-29 MED ORDER — ZOLEDRONIC ACID 5 MG/100ML IV SOLN
5.0000 mg | Freq: Once | INTRAVENOUS | Status: AC
  Administered 2024-09-29: 5 mg via INTRAVENOUS
  Filled 2024-09-29: qty 100

## 2024-09-29 MED ORDER — SODIUM CHLORIDE 0.9 % IV SOLN
Freq: Once | INTRAVENOUS | Status: AC
  Filled 2024-09-29: qty 250

## 2024-09-29 NOTE — Progress Notes (Signed)
 No concerns today

## 2024-09-29 NOTE — Progress Notes (Signed)
 [facility] CONSULT NOTE  Patient Care Team: Pardue, Lauraine SAILOR, DO as PCP - General (Family Medicine) Dannielle Arlean FALCON, RN (Inactive) as Oncology Nurse Navigator Rennie Cindy SAUNDERS, MD as Consulting Physician (Oncology) Dessa, Reyes ORN, MD as Consulting Physician (General Surgery) Lenn Aran, MD as Consulting Physician (Radiation Oncology)  CHIEF COMPLAINTS/PURPOSE OF CONSULTATION: Breast cancer  Oncology History Overview Note  #Invasive lobular breast cancere ER;PR Positive; her 2 Negative; stage IA. mT [33mm]; pN0; s/p lumpectomy; Oncotype -;   # IMPRESSION: 1. A 6 mm mass in the LEFT inner breast at 9 o'clock 1 cm from the nipple with associated architectural distortion is indeterminate. Recommend ultrasound-guided biopsy for definitive characterization with attention on post marker placement mammogram to assess for mammographic/sonographic correlation. 2. No suspicious LEFT axillary adenopathy.  DIAGNOSIS:  A.  BREAST, LEFT 9:00 1 CM FN; ULTRASOUND-GUIDED BIOPSY:  - INVASIVE MAMMARY CARCINOMA WITH LOBULAR FEATURES.  - LOBULAR NEOPLASIA.   Size of invasive carcinoma: 8 mm in this sample  Histologic grade of invasive carcinoma: Grade 2                       Glandular/tubular differentiation score: 3                       Nuclear pleomorphism score: 2                       Mitotic rate score: 1                       Total score: 6  Ductal carcinoma in situ: Not identified  Lymphovascular invasion: Not identified   ER/PR/HER2: Immunohistochemistry will be performed on block A1, with  reflex to FISH for HER2 2+. The results will be reported in an addendum.   Comment:  The definitive grade will be assigned on the excisional specimen.   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  Oncotype low risk. MAY 12th, 2023- Anastrazole.[Stopped sec to MSK]  # MID JUNE 2023- LETROZOLE  [sec to MSK  stopped April 2024]; MAY 2024- examestane- STOPPED JUNE 2024.  # JUNE 2024- Re-started back on Anastrazole    Carcinoma of overlapping sites of left breast in female, estrogen receptor positive (HCC)  02/07/2022 Initial Diagnosis   Carcinoma of overlapping sites of left breast in female, estrogen receptor positive (HCC)   02/07/2022 Cancer Staging   Staging form: Breast, AJCC 8th Edition - Pathologic: Stage IA (pT1c, pN0, cM0, G2, ER+, PR+, HER2-) - Signed by Rennie Cindy SAUNDERS, MD on 02/07/2022 Histologic grading system: 3 grade system    Genetic Testing   Negative genetic testing. No pathogenic variants identified on the [facility] CancerNext-Expanded+RNA panel. The report date is 02/21/2022.  The CancerNext-Expanded + RNAinsight gene panel offered by [employer] and includes sequencing and rearrangement analysis for the following 77 genes: IP, ALK, APC*, ATM*, AXIN2, BAP1, BARD1, BLM, BMPR1A, BRCA1*, BRCA2*, BRIP1*, CDC73, CDH1*,CDK4, CDKN1B, CDKN2A, CHEK2*, CTNNA1, DICER1, FANCC, FH, FLCN, GALNT12, KIF1B, LZTR1, MAX, MEN1, MET, MLH1*, MSH2*, MSH3, MSH6*, MUTYH*, NBN, NF1*, NF2, NTHL1, PALB2*, PHOX2B, PMS2*, POT1, PRKAR1A, PTCH1, PTEN*, RAD51C*, RAD51D*,RB1, RECQL, RET, SDHA, SDHAF2, SDHB, SDHC, SDHD, SMAD4, SMARCA4, SMARCB1, SMARCE1, STK11, SUFU, TMEM127, TP53*,TSC1, TSC2, VHL and XRCC2 (sequencing and deletion/duplication); EGFR, EGLN1, HOXB13, KIT, MITF, PDGFRA, POLD1 and POLE (sequencing only); EPCAM and GREM1 (deletion/duplication only).    HISTORY OF  PRESENTING ILLNESS: Patient ambulating independently.  Alone.  Tricia Rivera 63 y.o. female with a history of stage I ER/PR breast cancer, currently on adjuvant AI, returns to clinic for surveillance and for continuation of reclast . She denies dental pain or infections. No new breast lumps or masses. Denies skin changes. Denies unintentional weight loss. She stays active and walks for exercise.   Review of Systems  Constitutional:   Negative for chills, diaphoresis, fever, malaise/fatigue and weight loss.  HENT:  Negative for nosebleeds and sore throat.   Eyes:  Negative for double vision.  Respiratory:  Negative for cough, hemoptysis, sputum production, shortness of breath and wheezing.   Cardiovascular:  Negative for chest pain, palpitations, orthopnea and leg swelling.  Gastrointestinal:  Negative for abdominal pain, blood in stool, constipation, diarrhea, heartburn, melena, nausea and vomiting.  Genitourinary:  Negative for dysuria, frequency and urgency.  Musculoskeletal:  Negative for back pain, falls and joint pain.  Skin:  Negative for itching and rash.  Neurological:  Negative for dizziness, tingling, focal weakness, weakness and headaches.  Endo/Heme/Allergies:  Does not bruise/bleed easily.  Psychiatric/Behavioral:  Negative for depression. The patient is not nervous/anxious and does not have insomnia.     MEDICAL HISTORY:  Past Medical History:  Diagnosis Date   ADD (attention deficit disorder)    Anxiety    Breast cancer (HCC) 01/2022   Depression    Family history of breast cancer    Family history of liver cancer    Family history of pancreatic cancer    Hyperlipidemia    Malignant neoplasm of left breast (HCC)    Personal history of radiation therapy    Psoriasis    Vitamin D  deficiency    SURGICAL HISTORY: Past Surgical History:  Procedure Laterality Date   BREAST BIOPSY Left 01/08/2022   us  bs, venus marker, invasive mammary   BREAST LUMPECTOMY Left 01/24/2022   BREAST LUMPECTOMY WITH SENTINEL LYMPH NODE BIOPSY Left 01/24/2022   Procedure: BREAST LUMPECTOMY WITH SENTINEL AXIILLARY LYMPH NODE BX;  Surgeon: Dessa Reyes ORN, MD;  Location: [facility] ORS;  Service: General;  Laterality: Left;   BUNIONECTOMY  12/01/2008   BUNIONECTOMY N/A    2015   COLONOSCOPY     COLONOSCOPY WITH PROPOFOL  N/A 07/16/2022   Procedure: COLONOSCOPY WITH PROPOFOL ;  Surgeon: Dessa Reyes ORN, MD;  Location: [facility]  ENDOSCOPY;  Service: Endoscopy;  Laterality: N/A;    SOCIAL HISTORY: Social History   Socioeconomic History   Marital status: Married    Spouse name: Not on file   Number of children: Not on file   Years of education: Not on file   Highest education level: Bachelor's degree (e.g., BA, AB, BS)  Occupational History   Not on file  Tobacco Use   Smoking status: Never   Smokeless tobacco: Never  Vaping Use   Vaping status: Never Used  Substance and Sexual Activity   Alcohol use: Not Currently    Alcohol/week: 3.0 standard drinks of alcohol    Types: 3 Glasses of wine per week   Drug use: No   Sexual activity: Not Currently  Other Topics Concern   Not on file  Social History Narrative   Lives in [place]; with husband; son' grandson. Daughter- in [place]. 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    Social Drivers of Corporate Investment Banker Strain: Low Risk  (11/26/2023)   Overall Financial Resource Strain (CARDIA)    Difficulty of Paying Living Expenses: Not hard at all  Food Insecurity: No Food Insecurity (11/26/2023)   Hunger Vital Sign    Worried About Running Out of Food in the Last Year: Never true    Ran Out of Food in the Last Year: Never true  Transportation Needs: No Transportation Needs (11/26/2023)   PRAPARE - Administrator, Civil Service (Medical): No    Lack of Transportation (Non-Medical): No  Physical Activity: Insufficiently Active (11/26/2023)   Exercise Vital Sign    Days of Exercise per Week: 3 days    Minutes of Exercise per Session: 20 min  Stress: Stress Concern Present (11/26/2023)   [employer] of Occupational Health - Occupational Stress Questionnaire    Feeling of Stress : To some extent  Social Connections: Unknown (11/26/2023)   Social Connection and Isolation Panel    Frequency of Communication with Friends and Family: Three times a week    Frequency of Social Gatherings with Friends and Family: Once  a week    Attends Religious Services: Patient declined    Database Administrator or Organizations: No    Attends Engineer, Structural: Not on file    Marital Status: Married  Catering Manager Violence: Not on file    FAMILY HISTORY: Family History  Problem Relation Age of Onset   Pancreatic cancer Mother    Liver cancer Father    Breast cancer Maternal Aunt    Liver cancer Maternal Grandfather     ALLERGIES:  has no known allergies.  MEDICATIONS:  Current Outpatient Medications  Medication Sig Dispense Refill   acetaminophen  (TYLENOL ) 325 MG tablet Take 650 mg by mouth every 4 (four) hours as needed for mild pain.     anastrozole  (ARIMIDEX ) 1 MG tablet Take 1 tablet (1 mg total) by mouth daily. 90 tablet 1   busPIRone  (BUSPAR ) 15 MG tablet TAKE 1 TABLET (15 MG TOTAL) BY MOUTH DAILY. 90 tablet 2   hydrOXYzine (ATARAX) 25 MG tablet Take 25 mg by mouth at bedtime.     Ibuprofen 200 MG CAPS Take by mouth.     levothyroxine  (SYNTHROID ) 75 MCG tablet TAKE 1 TABLET BY MOUTH DAILY BEFORE BREAKFAST. 90 tablet 1   Vitamin D , Ergocalciferol , (DRISDOL ) 1.25 MG (50000 UNIT) CAPS capsule TAKE 1 CAPSULE (50,000 UNITS TOTAL) BY MOUTH EVERY 7 (SEVEN) DAYS 12 capsule 0   No current facility-administered medications for this visit.    PHYSICAL EXAMINATION: ECOG PERFORMANCE STATUS: 0 - Asymptomatic Vitals:   09/29/24 0850  BP: 137/83  Pulse: 76  Resp: 18  Temp: (!) 96.9 F (36.1 C)  SpO2: 99%   Filed Weights   09/29/24 0850  Weight: 130 lb 11.2 oz (59.3 kg)   Physical Exam Vitals reviewed.  Constitutional:      Appearance: She is not ill-appearing.     Comments: unaccompanied  HENT:     Head: Normocephalic and atraumatic.  Cardiovascular:     Rate and Rhythm: Normal rate and regular rhythm.  Pulmonary:     Effort: No respiratory distress.  Chest:     Comments: Declines breast exam Abdominal:     General: There is no distension.     Palpations: Abdomen is soft.   Musculoskeletal:        General: Normal range of motion.     Right lower leg: No edema.     Left lower leg: No edema.     Comments: Ambulates w/o aids  Skin:    General: Skin is warm.  Coloration: Skin is not pale.  Neurological:     Mental Status: She is alert and oriented to person, place, and time.  Psychiatric:        Mood and Affect: Mood normal.        Behavior: Behavior normal.    LABORATORY DATA:  I have reviewed the data as listed Lab Results  Component Value Date   WBC 3.7 (L) 09/29/2024   HGB 14.2 09/29/2024   HCT 41.7 09/29/2024   MCV 97.7 09/29/2024   PLT 273 09/29/2024   Recent Labs    12/03/23 1414 03/15/24 1009 09/29/24 0852  NA 141 139 136  K 4.6 4.1 4.1  CL 102 106 101  CO2 23 22 24   GLUCOSE 98 108* 116*  BUN 13 13 15   CREATININE 0.82 0.64 0.80  CALCIUM 9.7 9.0 9.2  GFRNONAA  --  >60 >60  PROT 7.3 7.5 7.7  ALBUMIN 4.8 4.2 4.4  AST 36 54* 36  ALT 41* 61* 42  ALKPHOS 70 56 53  BILITOT 0.4 0.6 0.7    RADIOGRAPHIC STUDIES: I have personally reviewed the radiological images as listed and agreed with the findings in the report. No results found.  ASSESSMENT & PLAN:   # Left breast cancer- MARCH 2023- invasive lobular breast cancer ER/PR Positive, Her 2 Negative; stage IA; low risk Oncotype.  No benefit from chemotherapy.  Patient s/p radiation. Intolerant to anastrozole - since mid June 2023. Trial of examestane but worsening symptoms. Now back on anastrozole  and tolerating well.   # Dense breast tissue-patient has heterogeneous breast density and screening breast MRI was offered however, patient was unable to tolerate despite Valium premedication.  She declines trying again. Jan 2025 Bilateral mammogram was BI-Rads 2: benign. (Dense breast tissue). Plan to repeat in January 2026.   # Declines breast exam today but no signs of symptoms. Reviewed surveillance guidelines and annual mammogram. Plan for at least 5 years of adjuvant endocrine  therapy completing in June 2028. She is no longer followed by surgery at her request and mammograms are coordinated through med-onc. Repeat in Jan 2025, ordered today.   # MSK- G-1-2 [trigger fingers; back pain]- sec to letrozole . Improving.   # Osteopenia: [June 2023]- The BMD measured at [facility] Neck Left  T-score of -2.1. Given risk factors for worsening bone loss, reclast  was recommended which she started October 2023. Tolerating well. Continue use of calcium 1200 mg, vitamin D  1000 mcg. Vitamin D  level was drawn and is slightly elevated at 102. She can reduce vitamin d  supplementation to every other day. Goal > 50. Encouraged weight bearing exercise and weight lifting to help strengthen bones. Repeat BMD at mammogram in January.    # Slightly elevated- AST- 61 March 2024. Lipid panel revealed elevated LDL, triglycerides mildly elevated. HDL 92. VLDL normal at 29. Dietary strategies were recommended including limiting processed foods, lean meats and veggies. AST now normalized. Managed by PCP. Reviewed that AI can worsen cholesterol levels so rec follow up with PCP.   09/05/22- Reclast    # DISPOSITION:  Reclast  today January Mammogram 6 mo- lab (cbc, cmp), Dr Rennie- [place]  No problem-specific Assessment & Plan notes found for this encounter.  All questions were answered. The patient/family knows to call the clinic with any problems, questions or concerns.    Tinnie KANDICE Dawn, NP 09/29/2024

## 2024-09-30 ENCOUNTER — Other Ambulatory Visit: Payer: Self-pay | Admitting: *Deleted

## 2024-09-30 DIAGNOSIS — Z780 Asymptomatic menopausal state: Secondary | ICD-10-CM

## 2024-10-13 ENCOUNTER — Other Ambulatory Visit: Payer: Self-pay | Admitting: Nurse Practitioner

## 2024-10-13 DIAGNOSIS — C50812 Malignant neoplasm of overlapping sites of left female breast: Secondary | ICD-10-CM

## 2024-10-18 ENCOUNTER — Encounter: Payer: Self-pay | Admitting: Internal Medicine

## 2024-11-09 ENCOUNTER — Other Ambulatory Visit: Payer: Self-pay | Admitting: Internal Medicine

## 2024-11-16 ENCOUNTER — Encounter: Payer: Self-pay | Admitting: Internal Medicine

## 2024-12-20 ENCOUNTER — Ambulatory Visit
Admission: RE | Admit: 2024-12-20 | Discharge: 2024-12-20 | Disposition: A | Discharge status: Home / Self Care | Payer: PRIVATE HEALTH INSURANCE | Source: Ambulatory Visit | Attending: Nurse Practitioner | Admitting: Nurse Practitioner

## 2024-12-20 ENCOUNTER — Encounter: Payer: Self-pay | Admitting: Internal Medicine

## 2024-12-20 DIAGNOSIS — Z780 Asymptomatic menopausal state: Secondary | ICD-10-CM | POA: Insufficient documentation

## 2024-12-20 DIAGNOSIS — Z170 Estrogen receptor positive status [ER+]: Secondary | ICD-10-CM | POA: Diagnosis present

## 2024-12-20 DIAGNOSIS — C50812 Malignant neoplasm of overlapping sites of left female breast: Secondary | ICD-10-CM | POA: Insufficient documentation

## 2024-12-20 DIAGNOSIS — M8580 Other specified disorders of bone density and structure, unspecified site: Secondary | ICD-10-CM | POA: Diagnosis present

## 2025-03-30 ENCOUNTER — Inpatient Hospital Stay: Payer: PRIVATE HEALTH INSURANCE

## 2025-03-30 ENCOUNTER — Inpatient Hospital Stay: Payer: PRIVATE HEALTH INSURANCE | Admitting: Internal Medicine

## 2025-03-31 ENCOUNTER — Inpatient Hospital Stay: Payer: PRIVATE HEALTH INSURANCE

## 2025-03-31 ENCOUNTER — Inpatient Hospital Stay: Payer: PRIVATE HEALTH INSURANCE | Admitting: Internal Medicine
